# Patient Record
Sex: Male | Born: 1935 | Race: White | Hispanic: No | Marital: Single | Smoking: Never smoker
Health system: Southern US, Community
[De-identification: ages and names within clinical notes are randomized; demographics above are authoritative.]

## PROBLEM LIST (undated history)

## (undated) DIAGNOSIS — I509 Heart failure, unspecified: Secondary | ICD-10-CM

## (undated) DIAGNOSIS — M1990 Unspecified osteoarthritis, unspecified site: Secondary | ICD-10-CM

## (undated) DIAGNOSIS — F0390 Unspecified dementia without behavioral disturbance: Secondary | ICD-10-CM

## (undated) DIAGNOSIS — I10 Essential (primary) hypertension: Secondary | ICD-10-CM

## (undated) DIAGNOSIS — E119 Type 2 diabetes mellitus without complications: Secondary | ICD-10-CM

## (undated) HISTORY — PX: OTHER SURGICAL HISTORY: SHX169

---

## 2005-04-06 ENCOUNTER — Emergency Department: Payer: Self-pay | Admitting: Emergency Medicine

## 2006-02-02 ENCOUNTER — Other Ambulatory Visit: Payer: Self-pay

## 2006-02-02 ENCOUNTER — Emergency Department: Payer: Self-pay | Admitting: Unknown Physician Specialty

## 2006-05-13 ENCOUNTER — Ambulatory Visit: Payer: Self-pay | Admitting: Unknown Physician Specialty

## 2006-09-06 ENCOUNTER — Ambulatory Visit: Payer: Self-pay | Admitting: Internal Medicine

## 2008-02-16 ENCOUNTER — Inpatient Hospital Stay: Payer: Self-pay | Admitting: Internal Medicine

## 2009-01-31 ENCOUNTER — Ambulatory Visit: Payer: Self-pay | Admitting: Internal Medicine

## 2009-06-30 ENCOUNTER — Ambulatory Visit: Payer: Self-pay | Admitting: Internal Medicine

## 2010-06-30 ENCOUNTER — Emergency Department: Payer: Self-pay | Admitting: Emergency Medicine

## 2011-06-29 ENCOUNTER — Encounter: Payer: Self-pay | Admitting: Internal Medicine

## 2011-09-23 ENCOUNTER — Emergency Department: Payer: Self-pay | Admitting: Emergency Medicine

## 2011-09-23 LAB — URINALYSIS, COMPLETE
Bacteria: NONE SEEN
Bilirubin,UR: NEGATIVE
Blood: NEGATIVE
Glucose,UR: 50 mg/dL (ref 0–75)
Leukocyte Esterase: NEGATIVE
Ph: 5 (ref 4.5–8.0)
Specific Gravity: 1.017 (ref 1.003–1.030)
Squamous Epithelial: NONE SEEN
WBC UR: 1 /HPF (ref 0–5)

## 2011-09-23 LAB — CBC WITH DIFFERENTIAL/PLATELET
Basophil #: 0 10*3/uL (ref 0.0–0.1)
Eosinophil #: 0.1 10*3/uL (ref 0.0–0.7)
Eosinophil %: 1.7 %
HCT: 41.1 % (ref 40.0–52.0)
Lymphocyte #: 1.2 10*3/uL (ref 1.0–3.6)
MCH: 30.5 pg (ref 26.0–34.0)
MCHC: 34.7 g/dL (ref 32.0–36.0)
Monocyte %: 7.4 %
Neutrophil #: 4.6 10*3/uL (ref 1.4–6.5)
Platelet: 222 10*3/uL (ref 150–440)
RDW: 13.4 % (ref 11.5–14.5)

## 2011-09-23 LAB — DRUG SCREEN, URINE
Amphetamines, Ur Screen: NEGATIVE (ref ?–1000)
Barbiturates, Ur Screen: NEGATIVE (ref ?–200)
Benzodiazepine, Ur Scrn: POSITIVE (ref ?–200)
Cannabinoid 50 Ng, Ur ~~LOC~~: NEGATIVE (ref ?–50)
Methadone, Ur Screen: NEGATIVE (ref ?–300)
Opiate, Ur Screen: POSITIVE (ref ?–300)
Phencyclidine (PCP) Ur S: NEGATIVE (ref ?–25)

## 2011-09-23 LAB — TROPONIN I: Troponin-I: <0.02 ng/mL

## 2011-09-23 LAB — COMPREHENSIVE METABOLIC PANEL
Alkaline Phosphatase: 90 U/L (ref 50–136)
Bilirubin,Total: 0.4 mg/dL (ref 0.2–1.0)
Co2: 30 mmol/L (ref 21–32)
Creatinine: 0.96 mg/dL (ref 0.60–1.30)
EGFR (African American): >60
EGFR (Non-African Amer.): >60
Glucose: 265 mg/dL — ABNORMAL HIGH (ref 65–99)
SGPT (ALT): 52 U/L (ref 12–78)

## 2011-09-23 LAB — CK TOTAL AND CKMB (NOT AT ARMC)
CK, Total: 622 U/L — ABNORMAL HIGH (ref 35–232)
CK-MB: 19.8 ng/mL — ABNORMAL HIGH (ref 0.5–3.6)

## 2011-09-23 LAB — ETHANOL: Ethanol %: <0.003 % (ref 0.000–0.080)

## 2011-09-26 ENCOUNTER — Encounter: Payer: Self-pay | Admitting: Internal Medicine

## 2011-12-27 ENCOUNTER — Emergency Department: Payer: Self-pay | Admitting: Emergency Medicine

## 2011-12-27 LAB — URINALYSIS, COMPLETE
Bacteria: NONE SEEN
Glucose,UR: NEGATIVE mg/dL (ref 0–75)
Hyaline Cast: 1
Leukocyte Esterase: NEGATIVE
Nitrite: NEGATIVE
Specific Gravity: 1.027 (ref 1.003–1.030)
Squamous Epithelial: <1
WBC UR: 3 /HPF (ref 0–5)

## 2011-12-27 LAB — COMPREHENSIVE METABOLIC PANEL
Alkaline Phosphatase: 93 U/L (ref 50–136)
Bilirubin,Total: 0.8 mg/dL (ref 0.2–1.0)
Calcium, Total: 8.7 mg/dL (ref 8.5–10.1)
Chloride: 103 mmol/L (ref 98–107)
Co2: 26 mmol/L (ref 21–32)
Creatinine: 0.9 mg/dL (ref 0.60–1.30)
EGFR (Non-African Amer.): >60
Glucose: 185 mg/dL — ABNORMAL HIGH (ref 65–99)
Osmolality: 277 (ref 275–301)
SGPT (ALT): 20 U/L (ref 12–78)
Sodium: 135 mmol/L — ABNORMAL LOW (ref 136–145)
Total Protein: 7.3 g/dL (ref 6.4–8.2)

## 2011-12-27 LAB — CBC
HGB: 13 g/dL (ref 13.0–18.0)
MCH: 30.8 pg (ref 26.0–34.0)
MCHC: 34.5 g/dL (ref 32.0–36.0)
MCV: 89 fL (ref 80–100)
Platelet: 213 10*3/uL (ref 150–440)
RBC: 4.23 10*6/uL — ABNORMAL LOW (ref 4.40–5.90)
RDW: 13.1 % (ref 11.5–14.5)

## 2011-12-27 LAB — DRUG SCREEN, URINE
Barbiturates, Ur Screen: NEGATIVE (ref ?–200)
Cocaine Metabolite,Ur ~~LOC~~: NEGATIVE (ref ?–300)
Methadone, Ur Screen: NEGATIVE (ref ?–300)
Opiate, Ur Screen: POSITIVE (ref ?–300)
Phencyclidine (PCP) Ur S: NEGATIVE (ref ?–25)
Tricyclic, Ur Screen: NEGATIVE (ref ?–1000)

## 2011-12-27 LAB — TSH: Thyroid Stimulating Horm: 1.38 u[IU]/mL

## 2011-12-27 LAB — ETHANOL: Ethanol %: <0.003 % (ref 0.000–0.080)

## 2012-01-07 ENCOUNTER — Emergency Department: Payer: Self-pay | Admitting: Unknown Physician Specialty

## 2012-01-07 LAB — CBC
HGB: 14 g/dL (ref 13.0–18.0)
MCH: 30.3 pg (ref 26.0–34.0)
MCHC: 34.4 g/dL (ref 32.0–36.0)
MCV: 88 fL (ref 80–100)
Platelet: 217 10*3/uL (ref 150–440)
RBC: 4.63 10*6/uL (ref 4.40–5.90)

## 2012-01-07 LAB — COMPREHENSIVE METABOLIC PANEL
Albumin: 3.6 g/dL (ref 3.4–5.0)
Anion Gap: 6 — ABNORMAL LOW (ref 7–16)
BUN: 13 mg/dL (ref 7–18)
Bilirubin,Total: 0.6 mg/dL (ref 0.2–1.0)
Creatinine: 0.67 mg/dL (ref 0.60–1.30)
EGFR (African American): >60
EGFR (Non-African Amer.): >60
Glucose: 129 mg/dL — ABNORMAL HIGH (ref 65–99)
Potassium: 3.2 mmol/L — ABNORMAL LOW (ref 3.5–5.1)
Sodium: 142 mmol/L (ref 136–145)
Total Protein: 6.6 g/dL (ref 6.4–8.2)

## 2012-01-20 ENCOUNTER — Emergency Department: Payer: Self-pay | Admitting: Emergency Medicine

## 2012-02-01 ENCOUNTER — Encounter: Payer: Self-pay | Admitting: Internal Medicine

## 2012-02-07 LAB — URINALYSIS, COMPLETE
Ketone: NEGATIVE
Protein: NEGATIVE
RBC,UR: 1 /HPF (ref 0–5)
WBC UR: 2 /HPF (ref 0–5)

## 2012-02-13 ENCOUNTER — Encounter: Payer: Self-pay | Admitting: Internal Medicine

## 2012-02-26 LAB — HEPATIC FUNCTION PANEL A (ARMC)
Albumin: 3.7 g/dL (ref 3.4–5.0)
Alkaline Phosphatase: 75 U/L (ref 50–136)
Bilirubin, Direct: <0.1 mg/dL (ref 0.00–0.20)
SGOT(AST): 19 U/L (ref 15–37)
Total Protein: 7 g/dL (ref 6.4–8.2)

## 2012-02-29 LAB — URINALYSIS, COMPLETE
Bilirubin,UR: NEGATIVE
Ketone: NEGATIVE
Leukocyte Esterase: NEGATIVE
Nitrite: NEGATIVE
Ph: 8 (ref 4.5–8.0)
RBC,UR: <1 /HPF (ref 0–5)
Squamous Epithelial: NONE SEEN
WBC UR: <1 /HPF (ref 0–5)

## 2012-03-04 LAB — HEPATIC FUNCTION PANEL A (ARMC)
Albumin: 2.7 g/dL — ABNORMAL LOW (ref 3.4–5.0)
Bilirubin,Total: 0.3 mg/dL (ref 0.2–1.0)

## 2012-03-04 LAB — VALPROIC ACID LEVEL: Valproic Acid: 38 ug/mL — ABNORMAL LOW

## 2012-03-15 ENCOUNTER — Encounter: Payer: Self-pay | Admitting: Internal Medicine

## 2012-03-23 LAB — COMPREHENSIVE METABOLIC PANEL
Albumin: 3.9 g/dL (ref 3.4–5.0)
Alkaline Phosphatase: 67 U/L (ref 50–136)
Anion Gap: 7 (ref 7–16)
Bilirubin,Total: 0.9 mg/dL (ref 0.2–1.0)
Calcium, Total: 8.5 mg/dL (ref 8.5–10.1)
Co2: 28 mmol/L (ref 21–32)
Creatinine: 0.98 mg/dL (ref 0.60–1.30)
EGFR (African American): >60
Potassium: 4 mmol/L (ref 3.5–5.1)
Sodium: 143 mmol/L (ref 136–145)

## 2012-03-25 LAB — COMPREHENSIVE METABOLIC PANEL
Anion Gap: 8 (ref 7–16)
Calcium, Total: 8.7 mg/dL (ref 8.5–10.1)
Chloride: 110 mmol/L — ABNORMAL HIGH (ref 98–107)
Co2: 24 mmol/L (ref 21–32)
Creatinine: 0.9 mg/dL (ref 0.60–1.30)
EGFR (African American): >60
EGFR (Non-African Amer.): >60
Glucose: 149 mg/dL — ABNORMAL HIGH (ref 65–99)
Osmolality: 292 (ref 275–301)
Sodium: 142 mmol/L (ref 136–145)
Total Protein: 6.7 g/dL (ref 6.4–8.2)

## 2012-04-12 ENCOUNTER — Encounter: Payer: Self-pay | Admitting: Internal Medicine

## 2012-06-30 ENCOUNTER — Encounter: Payer: Self-pay | Admitting: Internal Medicine

## 2012-07-13 ENCOUNTER — Encounter: Payer: Self-pay | Admitting: Internal Medicine

## 2012-08-12 ENCOUNTER — Encounter: Payer: Self-pay | Admitting: Internal Medicine

## 2012-09-12 ENCOUNTER — Encounter: Payer: Self-pay | Admitting: Internal Medicine

## 2012-10-13 ENCOUNTER — Encounter: Payer: Self-pay | Admitting: Internal Medicine

## 2012-11-12 ENCOUNTER — Encounter: Payer: Self-pay | Admitting: Internal Medicine

## 2012-12-13 ENCOUNTER — Encounter: Payer: Self-pay | Admitting: Internal Medicine

## 2013-01-12 ENCOUNTER — Encounter: Payer: Self-pay | Admitting: Internal Medicine

## 2013-02-12 ENCOUNTER — Encounter: Payer: Self-pay | Admitting: Internal Medicine

## 2013-03-03 LAB — LIPID PANEL
Cholesterol: 178 mg/dL (ref 0–200)
HDL Cholesterol: 46 mg/dL (ref 40–60)
Ldl Cholesterol, Calc: 104 mg/dL — ABNORMAL HIGH (ref 0–100)
Triglycerides: 140 mg/dL (ref 0–200)
VLDL Cholesterol, Calc: 28 mg/dL (ref 5–40)

## 2013-03-03 LAB — BASIC METABOLIC PANEL
ANION GAP: 7 (ref 7–16)
BUN: 14 mg/dL (ref 7–18)
CALCIUM: 8.5 mg/dL (ref 8.5–10.1)
Chloride: 107 mmol/L (ref 98–107)
Co2: 29 mmol/L (ref 21–32)
Creatinine: 0.87 mg/dL (ref 0.60–1.30)
EGFR (African American): >60
Glucose: 130 mg/dL — ABNORMAL HIGH (ref 65–99)
Osmolality: 287 (ref 275–301)
POTASSIUM: 3.8 mmol/L (ref 3.5–5.1)
Sodium: 143 mmol/L (ref 136–145)

## 2013-03-03 LAB — HEPATIC FUNCTION PANEL A (ARMC)
ALK PHOS: 76 U/L
Albumin: 3.5 g/dL (ref 3.4–5.0)
BILIRUBIN TOTAL: 0.4 mg/dL (ref 0.2–1.0)
Bilirubin, Direct: <0.1 mg/dL (ref 0.00–0.20)
SGOT(AST): 16 U/L (ref 15–37)
SGPT (ALT): 16 U/L (ref 12–78)
TOTAL PROTEIN: 6.3 g/dL — AB (ref 6.4–8.2)

## 2013-03-03 LAB — HEMOGLOBIN A1C: Hemoglobin A1C: 7.9 % — ABNORMAL HIGH (ref 4.2–6.3)

## 2013-03-15 ENCOUNTER — Encounter: Payer: Self-pay | Admitting: Internal Medicine

## 2013-04-12 ENCOUNTER — Encounter: Payer: Self-pay | Admitting: Internal Medicine

## 2013-05-13 ENCOUNTER — Encounter: Payer: Self-pay | Admitting: Internal Medicine

## 2013-05-18 LAB — URINALYSIS, COMPLETE
BILIRUBIN, UR: NEGATIVE
Bacteria: NONE SEEN
Blood: NEGATIVE
Glucose,UR: NEGATIVE mg/dL (ref 0–75)
KETONE: NEGATIVE
LEUKOCYTE ESTERASE: NEGATIVE
Nitrite: NEGATIVE
PH: 7 (ref 4.5–8.0)
Protein: NEGATIVE
RBC,UR: NONE SEEN /HPF (ref 0–5)
Specific Gravity: 1.004 (ref 1.003–1.030)
Squamous Epithelial: NONE SEEN
WBC UR: NONE SEEN /HPF (ref 0–5)

## 2013-05-20 LAB — URINE CULTURE

## 2013-06-12 ENCOUNTER — Encounter: Payer: Self-pay | Admitting: Internal Medicine

## 2013-06-25 LAB — BASIC METABOLIC PANEL
ANION GAP: 5 — AB (ref 7–16)
BUN: 16 mg/dL (ref 7–18)
Calcium, Total: 8.8 mg/dL (ref 8.5–10.1)
Chloride: 106 mmol/L (ref 98–107)
Co2: 28 mmol/L (ref 21–32)
Creatinine: 0.92 mg/dL (ref 0.60–1.30)
EGFR (Non-African Amer.): >60
Glucose: 189 mg/dL — ABNORMAL HIGH (ref 65–99)
Osmolality: 284 (ref 275–301)
POTASSIUM: 3.9 mmol/L (ref 3.5–5.1)
SODIUM: 139 mmol/L (ref 136–145)

## 2013-06-25 LAB — HEPATIC FUNCTION PANEL A (ARMC)
Albumin: 3.6 g/dL (ref 3.4–5.0)
Alkaline Phosphatase: 75 U/L
BILIRUBIN TOTAL: 0.3 mg/dL (ref 0.2–1.0)
SGOT(AST): 11 U/L — ABNORMAL LOW (ref 15–37)
SGPT (ALT): 20 U/L (ref 12–78)
TOTAL PROTEIN: 7.1 g/dL (ref 6.4–8.2)

## 2013-06-25 LAB — LIPID PANEL
CHOLESTEROL: 176 mg/dL (ref 0–200)
HDL Cholesterol: 40 mg/dL (ref 40–60)
LDL CHOLESTEROL, CALC: 101 mg/dL — AB (ref 0–100)
TRIGLYCERIDES: 175 mg/dL (ref 0–200)
VLDL Cholesterol, Calc: 35 mg/dL (ref 5–40)

## 2013-06-25 LAB — HEMOGLOBIN A1C: Hemoglobin A1C: 8 % — ABNORMAL HIGH (ref 4.2–6.3)

## 2013-07-13 ENCOUNTER — Encounter: Payer: Self-pay | Admitting: Internal Medicine

## 2013-08-12 ENCOUNTER — Encounter: Payer: Self-pay | Admitting: Internal Medicine

## 2013-09-12 ENCOUNTER — Encounter: Payer: Self-pay | Admitting: Internal Medicine

## 2013-10-13 ENCOUNTER — Encounter: Payer: Self-pay | Admitting: Internal Medicine

## 2013-11-12 ENCOUNTER — Encounter: Payer: Self-pay | Admitting: Internal Medicine

## 2013-12-13 ENCOUNTER — Encounter: Payer: Self-pay | Admitting: Internal Medicine

## 2014-01-12 ENCOUNTER — Encounter: Payer: Self-pay | Admitting: Internal Medicine

## 2014-01-28 ENCOUNTER — Emergency Department: Payer: Self-pay | Admitting: Emergency Medicine

## 2014-02-12 ENCOUNTER — Encounter: Payer: Self-pay | Admitting: Internal Medicine

## 2014-06-01 NOTE — Consult Note (Signed)
Brief Consult Note: Diagnosis: dementia, alzheimers and/or vascular.   Patient was seen by consultant.   Consult note dictated.   Comments: Psychiatry: Patient seen. Patient is 81 year old man with mild dementia sent from [facility]. He is not suicidal and not agressive and not threatening and not in a state that would suggest extreme poor care. Not psychotic. No hx of suicidality or violence. Patient does not need hospital care and does not meet criteria for IVC. Suggest he be sent back to [facility] and they can continue care for him.  Electronic Signatures: Audery Amellapacs, Stavroula Rohde T (MD)  (Signed [phone]-Nov-13 14:26)  Authored: Brief Consult Note   Last Updated: 14-Nov-13 14:26 by Audery Amellapacs, Maicy Filip T (MD)
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
## 2015-05-03 ENCOUNTER — Encounter: Payer: Self-pay | Admitting: Urology

## 2015-05-03 ENCOUNTER — Ambulatory Visit: Payer: Medicare Other | Admitting: Urology

## 2016-10-23 ENCOUNTER — Encounter: Payer: Self-pay | Admitting: *Deleted

## 2016-10-23 ENCOUNTER — Inpatient Hospital Stay
Admission: EM | Admit: 2016-10-23 | Discharge: 2016-10-26 | DRG: 638 | Disposition: A | Discharge status: Home / Self Care | Payer: Medicare Other | Attending: Internal Medicine | Admitting: Internal Medicine

## 2016-10-23 DIAGNOSIS — E860 Dehydration: Secondary | ICD-10-CM | POA: Diagnosis present

## 2016-10-23 DIAGNOSIS — Z794 Long term (current) use of insulin: Secondary | ICD-10-CM | POA: Diagnosis not present

## 2016-10-23 DIAGNOSIS — R739 Hyperglycemia, unspecified: Secondary | ICD-10-CM

## 2016-10-23 DIAGNOSIS — M1990 Unspecified osteoarthritis, unspecified site: Secondary | ICD-10-CM | POA: Diagnosis present

## 2016-10-23 DIAGNOSIS — E876 Hypokalemia: Secondary | ICD-10-CM | POA: Diagnosis present

## 2016-10-23 DIAGNOSIS — Z79899 Other long term (current) drug therapy: Secondary | ICD-10-CM | POA: Diagnosis not present

## 2016-10-23 DIAGNOSIS — G301 Alzheimer's disease with late onset: Secondary | ICD-10-CM | POA: Diagnosis not present

## 2016-10-23 DIAGNOSIS — R4689 Other symptoms and signs involving appearance and behavior: Secondary | ICD-10-CM | POA: Diagnosis present

## 2016-10-23 DIAGNOSIS — T383X6A Underdosing of insulin and oral hypoglycemic [antidiabetic] drugs, initial encounter: Secondary | ICD-10-CM | POA: Diagnosis present

## 2016-10-23 DIAGNOSIS — Z046 Encounter for general psychiatric examination, requested by authority: Secondary | ICD-10-CM

## 2016-10-23 DIAGNOSIS — E1165 Type 2 diabetes mellitus with hyperglycemia: Principal | ICD-10-CM | POA: Diagnosis present

## 2016-10-23 DIAGNOSIS — Z23 Encounter for immunization: Secondary | ICD-10-CM

## 2016-10-23 DIAGNOSIS — E871 Hypo-osmolality and hyponatremia: Secondary | ICD-10-CM | POA: Diagnosis not present

## 2016-10-23 DIAGNOSIS — Z886 Allergy status to analgesic agent status: Secondary | ICD-10-CM

## 2016-10-23 DIAGNOSIS — I700 Atherosclerosis of aorta: Secondary | ICD-10-CM | POA: Diagnosis not present

## 2016-10-23 DIAGNOSIS — Z888 Allergy status to other drugs, medicaments and biological substances status: Secondary | ICD-10-CM

## 2016-10-23 DIAGNOSIS — I10 Essential (primary) hypertension: Secondary | ICD-10-CM | POA: Diagnosis present

## 2016-10-23 DIAGNOSIS — F0281 Dementia in other diseases classified elsewhere with behavioral disturbance: Secondary | ICD-10-CM | POA: Diagnosis not present

## 2016-10-23 DIAGNOSIS — F29 Unspecified psychosis not due to a substance or known physiological condition: Secondary | ICD-10-CM | POA: Diagnosis not present

## 2016-10-23 DIAGNOSIS — Z79891 Long term (current) use of opiate analgesic: Secondary | ICD-10-CM | POA: Diagnosis not present

## 2016-10-23 DIAGNOSIS — E11649 Type 2 diabetes mellitus with hypoglycemia without coma: Secondary | ICD-10-CM | POA: Diagnosis present

## 2016-10-23 DIAGNOSIS — E1110 Type 2 diabetes mellitus with ketoacidosis without coma: Secondary | ICD-10-CM | POA: Diagnosis not present

## 2016-10-23 DIAGNOSIS — G309 Alzheimer's disease, unspecified: Secondary | ICD-10-CM | POA: Diagnosis present

## 2016-10-23 DIAGNOSIS — A159 Respiratory tuberculosis unspecified: Secondary | ICD-10-CM

## 2016-10-23 HISTORY — DX: Type 2 diabetes mellitus without complications: E11.9

## 2016-10-23 HISTORY — DX: Unspecified osteoarthritis, unspecified site: M19.90

## 2016-10-23 LAB — URINE DRUG SCREEN, QUALITATIVE (ARMC ONLY)
Amphetamines, Ur Screen: NOT DETECTED
BARBITURATES, UR SCREEN: NOT DETECTED
BENZODIAZEPINE, UR SCRN: NOT DETECTED
CANNABINOID 50 NG, UR ~~LOC~~: NOT DETECTED
COCAINE METABOLITE, UR ~~LOC~~: NOT DETECTED
MDMA (Ecstasy)Ur Screen: NOT DETECTED
Methadone Scn, Ur: NOT DETECTED
Opiate, Ur Screen: POSITIVE — AB
Phencyclidine (PCP) Ur S: NOT DETECTED
TRICYCLIC, UR SCREEN: NOT DETECTED

## 2016-10-23 LAB — CBC
HCT: 40.9 % (ref 40.0–52.0)
HEMOGLOBIN: 14.2 g/dL (ref 13.0–18.0)
MCH: 30.7 pg (ref 26.0–34.0)
MCHC: 34.7 g/dL (ref 32.0–36.0)
MCV: 88.7 fL (ref 80.0–100.0)
Platelets: 229 10*3/uL (ref 150–440)
RBC: 4.61 MIL/uL (ref 4.40–5.90)
RDW: 13 % (ref 11.5–14.5)
WBC: 10.9 10*3/uL — ABNORMAL HIGH (ref 3.8–10.6)

## 2016-10-23 LAB — COMPREHENSIVE METABOLIC PANEL
ALT: 16 U/L — ABNORMAL LOW (ref 17–63)
AST: 19 U/L (ref 15–41)
Albumin: 4 g/dL (ref 3.5–5.0)
Alkaline Phosphatase: 108 U/L (ref 38–126)
Anion gap: 12 (ref 5–15)
BUN: 21 mg/dL — ABNORMAL HIGH (ref 6–20)
CHLORIDE: 100 mmol/L — AB (ref 101–111)
CO2: 19 mmol/L — ABNORMAL LOW (ref 22–32)
Calcium: 8.7 mg/dL — ABNORMAL LOW (ref 8.9–10.3)
Creatinine, Ser: 1.3 mg/dL — ABNORMAL HIGH (ref 0.61–1.24)
GFR, EST AFRICAN AMERICAN: 56 mL/min — AB (ref >60)
GFR, EST NON AFRICAN AMERICAN: 48 mL/min — AB (ref >60)
Glucose, Bld: 566 mg/dL (ref 65–99)
POTASSIUM: 4.2 mmol/L (ref 3.5–5.1)
SODIUM: 131 mmol/L — AB (ref 135–145)
Total Bilirubin: 0.7 mg/dL (ref 0.3–1.2)
Total Protein: 7.1 g/dL (ref 6.5–8.1)

## 2016-10-23 LAB — URINALYSIS, COMPLETE (UACMP) WITH MICROSCOPIC
Bacteria, UA: NONE SEEN
Bilirubin Urine: NEGATIVE
HGB URINE DIPSTICK: NEGATIVE
Ketones, ur: 5 mg/dL — AB
Leukocytes, UA: NEGATIVE
Nitrite: NEGATIVE
PH: 5 (ref 5.0–8.0)
Protein, ur: 30 mg/dL — AB
SQUAMOUS EPITHELIAL / LPF: NONE SEEN
Specific Gravity, Urine: 1.025 (ref 1.005–1.030)

## 2016-10-23 LAB — SALICYLATE LEVEL

## 2016-10-23 LAB — ACETAMINOPHEN LEVEL: Acetaminophen (Tylenol), Serum: <10 ug/mL — ABNORMAL LOW (ref 10–30)

## 2016-10-23 LAB — GLUCOSE, CAPILLARY
Glucose-Capillary: 454 mg/dL — ABNORMAL HIGH (ref 65–99)
Glucose-Capillary: 226 mg/dL — ABNORMAL HIGH (ref 65–99)

## 2016-10-23 LAB — ETHANOL

## 2016-10-23 MED ORDER — SODIUM CHLORIDE 0.9 % IV SOLN
INTRAVENOUS | Status: DC
  Administered 2016-10-23 – 2016-10-24 (×2): via INTRAVENOUS

## 2016-10-23 MED ORDER — TUBERCULIN PPD 5 UNIT/0.1ML ID SOLN
5.0000 [IU] | Freq: Once | INTRADERMAL | Status: DC
  Filled 2016-10-23: qty 0.1

## 2016-10-23 MED ORDER — SODIUM CHLORIDE 0.9 % IV SOLN
Freq: Once | INTRAVENOUS | Status: AC
  Administered 2016-10-23: 14:00:00 via INTRAVENOUS

## 2016-10-23 MED ORDER — DOCUSATE SODIUM 100 MG PO CAPS: 100.0000 mg | ORAL_CAPSULE | Freq: Two times a day (BID) | ORAL | Status: DC | PRN

## 2016-10-23 MED ORDER — INSULIN ASPART 100 UNIT/ML ~~LOC~~ SOLN
12.0000 [IU] | SUBCUTANEOUS | Status: AC
  Administered 2016-10-23: 12 [IU] via SUBCUTANEOUS
  Filled 2016-10-23: qty 1

## 2016-10-23 MED ORDER — LOSARTAN POTASSIUM 50 MG PO TABS
100.0000 mg | ORAL_TABLET | Freq: Every day | ORAL | Status: DC
  Administered 2016-10-23 – 2016-10-26 (×4): 100 mg via ORAL
  Filled 2016-10-23 (×4): qty 2

## 2016-10-23 MED ORDER — HEPARIN SODIUM (PORCINE) 5000 UNIT/ML IJ SOLN
5000.0000 [IU] | Freq: Three times a day (TID) | INTRAMUSCULAR | Status: DC
  Administered 2016-10-23 – 2016-10-25 (×6): 5000 [IU] via SUBCUTANEOUS
  Filled 2016-10-23 (×7): qty 1

## 2016-10-23 MED ORDER — HYDROCODONE-ACETAMINOPHEN 10-325 MG PO TABS
1.0000 | ORAL_TABLET | Freq: Four times a day (QID) | ORAL | Status: DC
  Administered 2016-10-23 – 2016-10-26 (×11): 1 via ORAL
  Filled 2016-10-23 (×11): qty 1

## 2016-10-23 MED ORDER — INSULIN ASPART 100 UNIT/ML ~~LOC~~ SOLN
0.0000 [IU] | Freq: Three times a day (TID) | SUBCUTANEOUS | Status: DC
  Administered 2016-10-23: 9 [IU] via SUBCUTANEOUS
  Administered 2016-10-24: 3 [IU] via SUBCUTANEOUS
  Administered 2016-10-24: 2 [IU] via SUBCUTANEOUS
  Administered 2016-10-24 – 2016-10-25 (×2): 5 [IU] via SUBCUTANEOUS
  Administered 2016-10-25 – 2016-10-26 (×2): 7 [IU] via SUBCUTANEOUS
  Administered 2016-10-26: 3 [IU] via SUBCUTANEOUS
  Filled 2016-10-23 (×8): qty 1

## 2016-10-23 MED ORDER — HYDROCODONE-ACETAMINOPHEN 10-325 MG PO TABS
1.0000 | ORAL_TABLET | Freq: Once | ORAL | Status: AC
  Administered 2016-10-23: 1 via ORAL
  Filled 2016-10-23: qty 1

## 2016-10-23 NOTE — Clinical Social Work Note (Signed)
Clinical Social Work Assessment  Patient Details  Name: Jesus Holland MRN: [id number] Date of Birth: 12/31/1928  Date of referral:  10/23/16               Reason for consult:  Discharge Planning, Facility Placement, Mental Health Concerns                Permission sought to share information with:  Guardian, Psychiatrist Permission granted to share information::  Yes, Verbal Permission Granted  Name::     Davonna Belling  Agency::  [facility]  Relationship::  Legal Guardian  Contact Information:  [phone]  Housing/Transportation Living arrangements for the past 2 months:  Hotel/Motel Source of Information:  Guardian, Patient Patient Interpreter Needed:  None Criminal Activity/Legal Involvement Pertinent to Current Situation/Hospitalization:  No - Comment as needed Significant Relationships:  Other Family Members, Pets Lives with:  Pets, Self Do you feel safe going back to the place where you live?  Yes Need for family participation in patient care:  Yes (Comment)  Care giving concerns:  No care giving concerns identified.    Social Worker assessment / plan:  CSW received a consult for "Patient lives in a motel, dementia, deemed incompetent." CSW met with pt at bedside. Pt very talkative. Pt has no family locally. Pt has been living in a hotel for the past 2 months with his 2 dogs. Adult Protective Services has an open investigation and has been assigned a DSS Markham Jordan [phone] by the court as of 10/23/16. He is under involuntary commitment. Pt is not accepting or believing that he has a Guardian.   Pt has been evaluated by psych and they will continue to assess pt daily and evaluate symptoms/progress in treatment, in addition to medication management. Pt was to [facility] into [facility] today and began to "act out" upon arrival. CSW continuing to follow for support and disposition needs.  Pt is being admitted  inpatient to the medical floor due to elevated blood sugar. 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   Patient/Family's Understanding of and Emotional Response to Diagnosis, Current Treatment, and Prognosis:  Pt expresses understanding of his current medical state. However, pt does not believe or accept that he now has a Guardian. Pt is calm, yet very talkative.   Emotional Assessment Appearance:  Appears stated age Attitude/Demeanor/Rapport:  Other (Appropriate) Affect (typically observed):  Anxious, Accepting, Other (Talkative) Orientation:  Oriented to Self Alcohol / Substance use:  Other Psych involvement (Current and /or in the community):  Yes (Comment)  Discharge Needs  Concerns to be addressed:  Discharge Planning Concerns, Mental Health Concerns, Care Coordination Readmission within the last 30 days:  No Current discharge risk:  Lives alone, Psychiatric Illness Barriers to Discharge:  Continued Medical Work up   Truitt Merle, LCSW 10/23/2016, 5:52 PM

## 2016-10-23 NOTE — ED Notes (Signed)
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
## 2016-10-23 NOTE — Consult Note (Signed)
[facility] Psychiatry Consult   Reason for Consult:  Consult for 83 year old man brought to the hospital under IVC filed by Adult Protective Services for behavior problems Referring Physician:  [place] Patient Identification: Jesus Holland MRN:  [id number] Principal Diagnosis: Dementia, Alzheimer's, with behavior disturbance Diagnosis:   Patient Active Problem List   Diagnosis Date Noted  . Hyperglycemia [R73.9] 10/23/2016  . Dementia, Alzheimer's, with behavior disturbance [G30.9, F02.81] 10/23/2016    Total Time spent with patient: 1 hour  Subjective:   Jesus Holland is a 83 y.o. male patient admitted with "I want to go home".  HPI:  Patient interviewed chart reviewed. 83 year old man was brought here under IVC by Adult Scientist, forensic. Reportedly they achieved guardianship of him today. They had arranged for him to be placed at the [facility]. When they went there he became agitated and combative and they had to bring him to the emergency room. On interview the patient is aware that he is at the hospital. He knows the correct year. He tells me that they brought him over here because he did not want to stay where they had put him. Patient denied any suicidal or homicidal ideation. He denied having any hallucinations. His insight into the full situation however is limited. He does not either understand or accept that guardianship was obtained today. Patient insists that he has been able to take care of himself without any difficulty. He says moreover that his chief complaint about going to the [facility] is that they will not let him keep his 2 small dogs. Patient was found to have a blood sugar almost 600. Really looks like he's not taking care of his diabetes well. He is being admitted to medicine because of that.  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 His wife is deceased. He has no children or other family in the area. Apparently Adult Protective Services has been working  with him for a while and has had him declared incompetent and been awarded guardianship today.  Medical history: Diabetes currently with severe hyperglycemia  Substance abuse history: Denies a history of alcohol or drug abuse  Past Psychiatric History: No past history of any psychiatric treatment or admissions. No history of suicide attempts. No previous psychiatric medicine identified. Patient has been known to have dementia and been declining in his ability to care for himself for a while.  Risk to Self: Is patient at risk for suicide?: No Risk to Others:   Prior Inpatient Therapy:   Prior Outpatient Therapy:    Past Medical History:  Past Medical History:  Diagnosis Date  . Arthritis   . Diabetes mellitus without complication ([place])    History reviewed. No pertinent surgical history. Family History:  Family History  Problem Relation Age of Onset  . Family history unknown: Yes   Family Psychiatric  History: Denies knowing of any family history Social History:  History  Alcohol Use No     History  Drug Use No    Social History   Social History  . Marital status: Single    Spouse name: N/A  . Number of children: N/A  . Years of education: N/A   Social History Main Topics  . Smoking status: Never Smoker  . Smokeless tobacco: Never Used  . Alcohol use No  . Drug use: No  . Sexual activity: Not Asked   Other Topics Concern  . None   Social History Narrative  . None   Additional Social History:  Allergies:   Allergies  Allergen Reactions  . Aspirin Nausea And Vomiting  . Escitalopram Other (See Comments)  . Metformin Other (See Comments)  . Quinine Nausea And Vomiting    Labs:  Results for orders placed or performed during the hospital encounter of 10/23/16 (from the past 48 hour(s))  Comprehensive metabolic panel     Status: Abnormal   Collection Time: 10/23/16  1:18 PM  Result Value Ref Range   Sodium 131 (L) 135 - 145 mmol/L   Potassium 4.2 3.5 -  5.1 mmol/L   Chloride 100 (L) 101 - 111 mmol/L   CO2 19 (L) 22 - 32 mmol/L   Glucose, Bld 566 (HH) 65 - 99 mg/dL    Comment: CRITICAL RESULT CALLED TO, READ BACK BY AND VERIFIED WITH AMY TIGGS ON 10/23/16 AT 1412 BY JAG    BUN 21 (H) 6 - 20 mg/dL   Creatinine, Ser 1.30 (H) 0.61 - 1.24 mg/dL   Calcium 8.7 (L) 8.9 - 10.3 mg/dL   Total Protein 7.1 6.5 - 8.1 g/dL   Albumin 4.0 3.5 - 5.0 g/dL   AST 19 15 - 41 U/L   ALT 16 (L) 17 - 63 U/L   Alkaline Phosphatase 108 38 - 126 U/L   Total Bilirubin 0.7 0.3 - 1.2 mg/dL   GFR calc non Af Amer 48 (L) >60 mL/min   GFR calc Af Amer 56 (L) >60 mL/min    Comment: (NOTE) The eGFR has been calculated using the CKD EPI equation. This calculation has not been validated in all clinical situations. eGFR's persistently <60 mL/min signify possible Chronic Kidney Disease.    Anion gap 12 5 - 15  Ethanol     Status: None   Collection Time: 10/23/16  1:18 PM  Result Value Ref Range   Alcohol, Ethyl (B) <5 <5 mg/dL    Comment:        LOWEST DETECTABLE LIMIT FOR SERUM ALCOHOL IS 5 mg/dL FOR MEDICAL PURPOSES ONLY   Salicylate level     Status: None   Collection Time: 10/23/16  1:18 PM  Result Value Ref Range   Salicylate Lvl <3.5 2.8 - 30.0 mg/dL  Acetaminophen level     Status: Abnormal   Collection Time: 10/23/16  1:18 PM  Result Value Ref Range   Acetaminophen (Tylenol), Serum <10 (L) 10 - 30 ug/mL    Comment:        THERAPEUTIC CONCENTRATIONS VARY SIGNIFICANTLY. A RANGE OF 10-30 ug/mL MAY BE AN EFFECTIVE CONCENTRATION FOR MANY PATIENTS. HOWEVER, SOME ARE BEST TREATED AT CONCENTRATIONS OUTSIDE THIS RANGE. ACETAMINOPHEN CONCENTRATIONS >150 ug/mL AT 4 HOURS AFTER INGESTION AND >50 ug/mL AT 12 HOURS AFTER INGESTION ARE OFTEN ASSOCIATED WITH TOXIC REACTIONS.   cbc     Status: Abnormal   Collection Time: 10/23/16  1:18 PM  Result Value Ref Range   WBC 10.9 (H) 3.8 - 10.6 K/uL   RBC 4.61 4.40 - 5.90 MIL/uL   Hemoglobin 14.2 13.0 - 18.0  g/dL   HCT 40.9 40.0 - 52.0 %   MCV 88.7 80.0 - 100.0 fL   MCH 30.7 26.0 - 34.0 pg   MCHC 34.7 32.0 - 36.0 g/dL   RDW 13.0 11.5 - 14.5 %   Platelets 229 150 - 440 K/uL    Current Facility-Administered Medications  Medication Dose Route Frequency Provider Last Rate Last Dose  . 0.9 %  sodium chloride infusion   Intravenous Continuous Vaughan Basta, MD      .  insulin aspart (novoLOG) injection 0-9 Units  0-9 Units Subcutaneous TID WC Vaughan Basta, MD      . insulin aspart (novoLOG) injection 12 Units  12 Units Subcutaneous STAT Vaughan Basta, MD       Current Outpatient Prescriptions  Medication Sig Dispense Refill  . HYDROcodone-acetaminophen (NORCO) 10-325 MG tablet Take 1 tablet by mouth 4 (four) times daily.    Marland Kitchen LANTUS SOLOSTAR 100 UNIT/ML Solostar Pen Inject 44 Units into the skin daily.    Marland Kitchen losartan (COZAAR) 100 MG tablet Take 100 mg by mouth daily.      Musculoskeletal: Strength & Muscle Tone: decreased Gait & Station: unsteady Patient leans: N/A  Psychiatric Specialty Exam: Physical Exam  Nursing note and vitals reviewed. Constitutional: He appears well-developed and well-nourished.  HENT:  Head: Normocephalic and atraumatic.  Eyes: Pupils are equal, round, and reactive to light. Conjunctivae are normal.  Neck: Normal range of motion.  Cardiovascular: Normal heart sounds.   Respiratory: Effort normal.  GI: Soft.  Musculoskeletal: Normal range of motion.  Neurological: He is alert.  Skin: Skin is warm and dry.  Psychiatric: He has a normal mood and affect. His speech is delayed, tangential and slurred. He is agitated. He is not aggressive. Thought content is paranoid. Cognition and memory are impaired. He expresses impulsivity and inappropriate judgment. He expresses no homicidal and no suicidal ideation. He exhibits abnormal recent memory.    Review of Systems  Constitutional: Positive for malaise/fatigue.  HENT: Negative.   Eyes:  Negative.   Respiratory: Negative.   Cardiovascular: Negative.   Gastrointestinal: Negative.   Musculoskeletal: Negative.   Skin: Negative.   Neurological: Negative.   Psychiatric/Behavioral: Negative.     Blood pressure 137/68, pulse (!) 18, temperature 98.7 F (37.1 C), temperature source Oral, resp. rate 18, height 5' 10"  (1.778 m), weight 69.9 kg (154 lb), SpO2 98 %.Body mass index is 22.1 kg/m.  General Appearance: Disheveled  Eye Contact:  Fair  Speech:  Slow  Volume:  Decreased  Mood:  Euthymic  Affect:  Constricted  Thought Process:  Disorganized  Orientation:  Full (Time, Place, and Person)  Thought Content:  Illogical, Rumination and Tangential  Suicidal Thoughts:  No  Homicidal Thoughts:  No  Memory:  Immediate;   Good Recent;   Fair Remote;   Fair  Judgement:  Impaired  Insight:  Shallow  Psychomotor Activity:  Decreased  Concentration:  Concentration: Poor  Recall:  Poor  Fund of Knowledge:  Fair  Language:  Fair  Akathisia:  No  Handed:  Right  AIMS (if indicated):     Assets:  Resilience Social Support  ADL's:  Impaired  Cognition:  Impaired,  Mild and Moderate  Sleep:        Treatment Plan Summary: Daily contact with patient to assess and evaluate symptoms and progress in treatment, Medication management and Plan 83 year old man with dementia. Became agitated when they tried to place him today. He is upset that he cannot keep his dogs. All of this makes sense but the patient will not accept the reality that guardianship has been achieved and he no longer has the right to make any decisions about his living situation. Continues to insist that he will not go back to the [facility]. In fact he went so far as to suggest that he would be traveling to [place] which is obviously impossible. The patient is being admitted to the medical service because of hyperglycemia. I am going to add a small amount of  antipsychotic for improvement I hope in his behavior. We will  follow him up in the hospital.  Disposition: Supportive therapy provided about ongoing stressors.  Alethia Berthold, MD 10/23/2016 5:33 PM

## 2016-10-23 NOTE — ED Notes (Signed)
BEHAVIORAL HEALTH ROUNDING Patient sleeping: No. Patient alert and oriented: yes Behavior appropriate: Yes.  ; If no, describe:  Nutrition and fluids offered: yes Toileting and hygiene offered: Yes  Sitter present: q15 minute observations and security  monitoring Law enforcement present: Yes  ODS  

## 2016-10-23 NOTE — H&P (Signed)
[employer] - [place] at [facility]   PATIENT NAME: Jesus Holland    MR#:  [id number]  DATE OF BIRTH:  June 03, 1928  DATE OF ADMISSION:  10/23/2016  PRIMARY CARE PHYSICIAN: Patient, No Pcp Per   REQUESTING/REFERRING PHYSICIAN: Chrissie Noa  CHIEF COMPLAINT:   Chief Complaint  Patient presents with  . IVC    HISTORY OF PRESENT ILLNESS: Kazim Corrales  is a 83 y.o. male with a known history of Diabetes, arthritis- have dementia and he lives in a motel. As per him he was ready to go to assisted living place but they were refusing to take his 2 dogs with him so he chose to leave in a motel. He is blaming all this to Department of Social Services as he tried multiple times and could not get appointments. He was supposed to take insulin for his diabetes but not very sure if he is taking one time or not. Currently he is alert and oriented but appears to be having some confusion over some dementia about his surroundings and not able to say clearly why he did not take insulin. Department of Social Services is called police on him to bring him to emergency room. In ER he was kept on involuntary commitment because of his possible dementia and his blood sugar was noted more than 500. No signs of ketoacidosis so started on IV fluids and given to hospitalist team.  PAST MEDICAL HISTORY:   Past Medical History:  Diagnosis Date  . Arthritis   . Diabetes mellitus without complication (HCC)     PAST SURGICAL HISTORY: History reviewed. No pertinent surgical history.  SOCIAL HISTORY:  Social History  Substance Use Topics  . Smoking status: Never Smoker  . Smokeless tobacco: Never Used  . Alcohol use No    FAMILY HISTORY:  Family History  Problem Relation Age of Onset  . Family history unknown: Yes    DRUG ALLERGIES: Not on File  REVIEW OF SYSTEMS:   CONSTITUTIONAL: No fever, fatigue or weakness.  EYES: No blurred or double vision.  EARS, NOSE, AND THROAT: No tinnitus or ear  pain.  RESPIRATORY: No cough, shortness of breath, wheezing or hemoptysis.  CARDIOVASCULAR: No chest pain, orthopnea, edema.  GASTROINTESTINAL: No nausea, vomiting, diarrhea or abdominal pain.  GENITOURINARY: No dysuria, hematuria.  ENDOCRINE: No polyuria, nocturia,  HEMATOLOGY: No anemia, easy bruising or bleeding SKIN: No rash or lesion. MUSCULOSKELETAL: No joint pain or arthritis.   NEUROLOGIC: No tingling, numbness, weakness.  PSYCHIATRY: No anxiety or depression.   MEDICATIONS AT HOME:  Prior to Admission medications   Medication Sig Start Date End Date Taking? Authorizing Provider  HYDROcodone-acetaminophen (NORCO) 10-325 MG tablet Take 1 tablet by mouth 4 (four) times daily. 10/20/16  Yes [provider]  LANTUS SOLOSTAR 100 UNIT/ML Solostar Pen Inject 44 Units into the skin daily. 09/24/16  Yes [provider]  losartan (COZAAR) 100 MG tablet Take 100 mg by mouth daily. 08/16/16  Yes [provider]      PHYSICAL EXAMINATION:   VITAL SIGNS: Blood pressure 137/68, pulse (!) 18, temperature 98.7 F (37.1 C), temperature source Oral, resp. rate 18, height  (1.778 m), weight 69.9 kg (154 lb), SpO2 98 %.  GENERAL:  83 y.o.-year-old patient lying in the bed with no acute distress.  EYES: Pupils equal, round, reactive to light and accommodation. No scleral icterus. Extraocular muscles intact.  HEENT: Head atraumatic, normocephalic. Oropharynx and nasopharynx clear.  NECK:  Supple, no jugular venous distention.  No thyroid enlargement, no tenderness.  LUNGS: Normal breath sounds bilaterally, no wheezing, rales,rhonchi or crepitation. No use of accessory muscles of respiration.  CARDIOVASCULAR: S1, S2 normal. No murmurs, rubs, or gallops.  ABDOMEN: Soft, nontender, nondistended. Bowel sounds present. No organomegaly or mass.  EXTREMITIES: No pedal edema, cyanosis, or clubbing.  NEUROLOGIC: Cranial nerves II through XII are intact. Muscle strength 5/5 in all  extremities. Sensation intact. Gait not checked.  PSYCHIATRIC: The patient is alert and oriented x 2 but have some confusion and not very clear about the events.  SKIN: No obvious rash, lesion, or ulcer.   LABORATORY PANEL:   CBC  Recent Labs Lab 10/23/16 1318  WBC 10.9*  HGB 14.2  HCT 40.9  PLT 229  MCV 88.7  MCH 30.7  MCHC 34.7  RDW 13.0   ------------------------------------------------------------------------------------------------------------------  Chemistries   Recent Labs Lab 10/23/16 1318  NA 131*  K 4.2  CL 100*  CO2 19*  GLUCOSE 566*  BUN 21*  CREATININE 1.30*  CALCIUM 8.7*  AST 19  ALT 16*  ALKPHOS 108  BILITOT 0.7   ------------------------------------------------------------------------------------------------------------------ estimated creatinine clearance is 40.3 mL/min (A) (by C-G formula based on SCr of 1.3 mg/dL (H)). ------------------------------------------------------------------------------------------------------------------ No results for input(s): TSH, T4TOTAL, T3FREE, THYROIDAB in the last 72 hours.  Invalid input(s): FREET3   Coagulation profile No results for input(s): INR, PROTIME in the last 168 hours. ------------------------------------------------------------------------------------------------------------------- No results for input(s): DDIMER in the last 72 hours. -------------------------------------------------------------------------------------------------------------------  Cardiac Enzymes No results for input(s): CKMB, TROPONINI, MYOGLOBIN in the last 168 hours.  Invalid input(s): CK ------------------------------------------------------------------------------------------------------------------ Invalid input(s): POCBNP  ---------------------------------------------------------------------------------------------------------------  Urinalysis    Component Value Date/Time   COLORURINE Colorless 05/18/2013  1739   APPEARANCEUR Clear 05/18/2013 1739   LABSPEC 1.004 05/18/2013 1739   PHURINE 7.0 05/18/2013 1739   GLUCOSEU Negative 05/18/2013 1739   HGBUR Negative 05/18/2013 1739   BILIRUBINUR Negative 05/18/2013 1739   KETONESUR Negative 05/18/2013 1739   PROTEINUR Negative 05/18/2013 1739   NITRITE Negative 05/18/2013 1739   LEUKOCYTESUR Negative 05/18/2013 1739     RADIOLOGY: No results found.  EKG: Orders placed or performed in visit on 01/28/14  . EKG 12-Lead    IMPRESSION AND PLAN:  * Hyperglycemia without ketoacidosis   His compliance is questionable due to possible dementia.   He is on involuntary commitment currently and DSS is working with psychiatric the to get him in geriatric psych unit.   Will admit to medical floor and give insulin for now, with IV fluids.    * Confusion, likely advanced dementia   Check urinalysis.   Psych to work on his placement along with DSS   * Hypertension   Continue losartan.   All the records are reviewed and case discussed with ED provider. Management plans discussed with the patient, family and they are in agreement.  CODE STATUS: Full code Code Status History    This patient does not have a recorded code status. Please follow your organizational policy for patients in this situation.       TOTAL TIME TAKING CARE OF THIS PATIENT: 50 minutes.    Altamese DillingVACHHANI, Hasheem Voland M.D on 10/23/2016   Between 7am to 6pm - Pager - [phone]  After 6pm go to [email or web address] - password [email or web address]  [facility] [place] [facility]  Office  [phone]  CC: Primary care physician; Patient, No Pcp Per   Note: This dictation was prepared with Dragon dictation along with smaller phrase technology. Any transcriptional errors that result from  this process are unintentional.

## 2016-10-23 NOTE — Progress Notes (Signed)
Pt with critical fsbs without intervention from ER. Gave scheduled insulin per Md order although it is 2 hours overdue. Will re check after administration. Also, ED did not give pt scheduled hydrocodone that pt takes  every 6 hours. Per Md will give dose now and resume schedule at 10 pm.
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
## 2016-10-23 NOTE — ED Notes (Signed)
PT  IVC  PENDING  SOCIAL WORK CONSULT

## 2016-10-24 LAB — BASIC METABOLIC PANEL
Anion gap: 7 (ref 5–15)
BUN: 20 mg/dL (ref 6–20)
CHLORIDE: 110 mmol/L (ref 101–111)
CO2: 23 mmol/L (ref 22–32)
Calcium: 8 mg/dL — ABNORMAL LOW (ref 8.9–10.3)
Creatinine, Ser: 0.93 mg/dL (ref 0.61–1.24)
GFR calc Af Amer: >60 mL/min (ref >60)
GFR calc non Af Amer: >60 mL/min (ref >60)
GLUCOSE: 86 mg/dL (ref 65–99)
Potassium: 3.2 mmol/L — ABNORMAL LOW (ref 3.5–5.1)
SODIUM: 140 mmol/L (ref 135–145)

## 2016-10-24 LAB — CBC
HEMATOCRIT: 34.1 % — AB (ref 40.0–52.0)
Hemoglobin: 12 g/dL — ABNORMAL LOW (ref 13.0–18.0)
MCH: 30.6 pg (ref 26.0–34.0)
MCHC: 35.3 g/dL (ref 32.0–36.0)
MCV: 86.7 fL (ref 80.0–100.0)
Platelets: 197 10*3/uL (ref 150–440)
RBC: 3.93 MIL/uL — ABNORMAL LOW (ref 4.40–5.90)
RDW: 13.3 % (ref 11.5–14.5)
WBC: 9.3 10*3/uL (ref 3.8–10.6)

## 2016-10-24 LAB — GLUCOSE, CAPILLARY
GLUCOSE-CAPILLARY: 189 mg/dL — AB (ref 65–99)
Glucose-Capillary: 283 mg/dL — ABNORMAL HIGH (ref 65–99)
Glucose-Capillary: 223 mg/dL — ABNORMAL HIGH (ref 65–99)
Glucose-Capillary: 299 mg/dL — ABNORMAL HIGH (ref 65–99)

## 2016-10-24 LAB — MAGNESIUM: Magnesium: 1.6 mg/dL — ABNORMAL LOW (ref 1.7–2.4)

## 2016-10-24 MED ORDER — INSULIN DETEMIR 100 UNIT/ML ~~LOC~~ SOLN
25.0000 [IU] | Freq: Every day | SUBCUTANEOUS | Status: DC
  Administered 2016-10-24: 25 [IU] via SUBCUTANEOUS
  Filled 2016-10-24 (×2): qty 0.25

## 2016-10-24 MED ORDER — INFLUENZA VAC SPLIT HIGH-DOSE 0.5 ML IM SUSY
0.5000 mL | PREFILLED_SYRINGE | INTRAMUSCULAR | Status: AC
  Administered 2016-10-25: 0.5 mL via INTRAMUSCULAR
  Filled 2016-10-24 (×2): qty 0.5

## 2016-10-24 MED ORDER — POTASSIUM CHLORIDE CRYS ER 20 MEQ PO TBCR
40.0000 meq | EXTENDED_RELEASE_TABLET | Freq: Once | ORAL | Status: AC
  Administered 2016-10-24: 40 meq via ORAL
  Filled 2016-10-24: qty 2

## 2016-10-24 MED ORDER — LORAZEPAM 2 MG/ML IJ SOLN: 1.0000 mg | Freq: Four times a day (QID) | INTRAMUSCULAR | Status: DC | PRN

## 2016-10-24 MED ORDER — MAGNESIUM OXIDE 400 (241.3 MG) MG PO TABS
800.0000 mg | ORAL_TABLET | Freq: Once | ORAL | Status: AC
  Administered 2016-10-24: 800 mg via ORAL
  Filled 2016-10-24 (×2): qty 2

## 2016-10-24 MED ORDER — INSULIN DETEMIR 100 UNIT/ML ~~LOC~~ SOLN: 25.0000 [IU] | Freq: Every day | SUBCUTANEOUS | Status: DC

## 2016-10-24 NOTE — Consult Note (Signed)
Psychiatry: Patient with dementia who now has a legal guardian in APS. Refusing placement and acting opp[sitional because his dementia prevents him from really understanding the meaning of guardianship. Advise SW contact guardian, who should TRY to work with him to come up with an agreeable solution. If this can't be done, then try referral to geriatric psychiatry hospital for treatment that may ultimately improve his cooperation. Patient is not a candidate for inpatient psychiatry treatment at [facility] due to age and diagnosis.Spoke with hospitalist today. Will follow patient in hospital.

## 2016-10-24 NOTE — Progress Notes (Signed)
Inpatient Diabetes Program Recommendations  AACE/ADA: New Consensus Statement on Inpatient Glycemic Control (2015)  Target Ranges:  Prepandial:   less than 140 mg/dL      Peak postprandial:   less than 180 mg/dL (1-2 hours)      Critically ill patients:  140 - 180 mg/dL   Lab Results  Component Value Date   GLUCAP 283 (H) 10/24/2016   HGBA1C 8.0 (H) 06/25/2013    Review of Glycemic Control:  Results for Jesus Holland, Jesus Holland (MRN [id number]) as of 10/24/2016 11:26  Ref. Range 10/23/2016 17:58 10/23/2016 20:55 10/24/2016 08:46  Glucose-Capillary Latest Ref Range: 65 - 99 mg/dL 644454 (H) 034226 (H) 742283 (H)   Diabetes history: Type 2 diabetes Outpatient Diabetes medications: Lantus 44 units daily Current orders for Inpatient glycemic control:  Novolog sensitive tid with meals  Inpatient Diabetes Program Recommendations:   Please restart a portion of patient's home dose of Lantus.  Consider Lantus 22 units daily.    Thanks, Beryl MeagerJenny Bellamia Ferch, RN, BC-ADM Inpatient Diabetes Coordinator Pager [phone] (8a-5p)

## 2016-10-24 NOTE — Clinical Social Work Note (Signed)
CSW has sent out patient's referral to [facility] facilities this morning: [place], [place], and [facility]. CSW contacted patient's DSS APS Guardian: Cleophas Dunkerngela Riley:: [phone] to determine if [facility] was going to even consider finishing admission paperwork due to patient's behaviors yesterday at their facility. York SpanielMonica Alayiah Fontes MSW,LCSW [phone]

## 2016-10-24 NOTE — Progress Notes (Signed)
[employer] - [place] at [facility]   PATIENT NAME: Jesus PagesVictor Holland    MR#:  [id number]  DATE OF BIRTH:  1929-01-23  SUBJECTIVE:  CHIEF COMPLAINT:   Chief Complaint  Patient presents with  . IVC   Oriented to place but not time  REVIEW OF SYSTEMS:    Review of Systems  Unable to perform ROS: Dementia    DRUG ALLERGIES:   Allergies  Allergen Reactions  . Aspirin Nausea And Vomiting  . Escitalopram Other (See Comments)  . Metformin Other (See Comments)  . Quinine Nausea And Vomiting    VITALS:  Blood pressure 131/65, pulse 74, temperature 98.5 F (36.9 C), temperature source Oral, resp. rate 20, height 5\' 10"  (1.778 m), weight 69.9 kg (154 lb), SpO2 97 %.  PHYSICAL EXAMINATION:   Physical Exam  GENERAL:  83 y.o.-year-old patient lying in the bed with no acute distress.  EYES: Pupils equal, round, reactive to light and accommodation. No scleral icterus. Extraocular muscles intact.  HEENT: Head atraumatic, normocephalic. Oropharynx and nasopharynx clear.  NECK:  Supple, no jugular venous distention. No thyroid enlargement, no tenderness.  LUNGS: Normal breath sounds bilaterally, no wheezing, rales, rhonchi. No use of accessory muscles of respiration.  CARDIOVASCULAR: S1, S2 normal. No murmurs, rubs, or gallops.  ABDOMEN: Soft, nontender, nondistended. Bowel sounds present. No organomegaly or mass.  EXTREMITIES: No cyanosis, clubbing or edema b/l.    NEUROLOGIC: Cranial nerves II through XII are intact. No focal Motor or sensory deficits b/l.   PSYCHIATRIC: The patient is alert and oriented x 1. Pleasant  SKIN: No obvious rash, lesion, or ulcer.   LABORATORY PANEL:   CBC  Recent Labs Lab 10/24/16 0457  WBC 9.3  HGB 12.0*  HCT 34.1*  PLT 197   ------------------------------------------------------------------------------------------------------------------ Chemistries   Recent Labs Lab 10/23/16 1318 10/24/16 0457  NA 131* 140  K 4.2 3.2*   CL 100* 110  CO2 19* 23  GLUCOSE 566* 86  BUN 21* 20  CREATININE 1.30* 0.93  CALCIUM 8.7* 8.0*  MG  --  1.6*  AST 19  --   ALT 16*  --   ALKPHOS 108  --   BILITOT 0.7  --    ------------------------------------------------------------------------------------------------------------------  Cardiac Enzymes No results for input(s): TROPONINI in the last 168 hours. ------------------------------------------------------------------------------------------------------------------  RADIOLOGY:  No results found.   ASSESSMENT AND PLAN:   * Uncontrolled DM with hyperglycemia  SSi Restart Levemir at 25 units QHS Improving Still elevated into 200s  * Dementia with behavioral disturbance Has legal gaurdian and now in IVC with sitter due to patient refusing to go to facility and brought to ED Difficult situation for placement  * Hypokalemia Replace orally  *  All the records are reviewed and case discussed with Care Management/Social Worker Management plans discussed with the patient, family and they are in agreement.  CODE STATUS: FULL CODE  DVT Prophylaxis: SCDs  TOTAL TIME TAKING CARE OF THIS PATIENT: 40 minutes.  >50% time spent in co-ordinating care discussing with patient  POSSIBLE D/C IN 1-2 DAYS, DEPENDING ON CLINICAL CONDITION.  Milagros LollSudini, Sahian Kerney R M.D on 10/24/2016 at 12:59 PM  Between 7am to 6pm - Pager - [phone]  After 6pm go to [email or web address] - password [email or web address]  [facility] [place] [facility]  Office  [phone]  CC: Primary care physician; Patient, No Pcp Per  Note: This dictation was prepared with Dragon dictation along with smaller phrase technology. Any transcriptional errors that result from this process are unintentional.

## 2016-10-24 NOTE — Progress Notes (Signed)
Pt is refusing to have a new IV placed until a doctor speaks with him.

## 2016-10-24 NOTE — Progress Notes (Signed)
Discussed the need to restart PIV due to one being pulled out. Pt refused stating "I dont want that until I speak to a Dr. [facility] am [facility] [place] Prime doc paged and notified per Leslye Peerlivia Rogers RN. Pt is tolerating PO fluids.

## 2016-10-25 LAB — GLUCOSE, CAPILLARY
GLUCOSE-CAPILLARY: 57 mg/dL — AB (ref 65–99)
GLUCOSE-CAPILLARY: 68 mg/dL (ref 65–99)
GLUCOSE-CAPILLARY: 99 mg/dL (ref 65–99)
GLUCOSE-CAPILLARY: 341 mg/dL — AB (ref 65–99)
GLUCOSE-CAPILLARY: 266 mg/dL — AB (ref 65–99)
GLUCOSE-CAPILLARY: 273 mg/dL — AB (ref 65–99)
Glucose-Capillary: 161 mg/dL — ABNORMAL HIGH (ref 65–99)

## 2016-10-25 MED ORDER — INSULIN DETEMIR 100 UNIT/ML ~~LOC~~ SOLN
18.0000 [IU] | Freq: Every day | SUBCUTANEOUS | Status: DC
  Administered 2016-10-25: 18 [IU] via SUBCUTANEOUS
  Filled 2016-10-25 (×2): qty 0.18

## 2016-10-25 NOTE — Clinical Social Work Note (Signed)
DSS Guardian, Jesus Holland, returned CSW call and CSW informed her that at this point, she will need to find facility placement or she will need to make arrangements for patient to return home with more care. Jesus Holland is to call me back after she speaks with her supervisor: Jesus Holland. York SpanielMonica Aybree Holland MSW,LCSW [phone]

## 2016-10-25 NOTE — Progress Notes (Signed)
[employer] - [place] at [facility]   PATIENT NAME: Jesus Holland    MR#:  [id number]  DATE OF BIRTH:  1928/10/23  SUBJECTIVE:  CHIEF COMPLAINT:   Chief Complaint  Patient presents with  . IVC   Patient is laying in bed. Pleasant. Wants to go home. IVC in place. Sitter at bedside.  Blood sugars low into the 50s earlier today.  REVIEW OF SYSTEMS:    Review of Systems  Unable to perform ROS: Dementia    DRUG ALLERGIES:   Allergies  Allergen Reactions  . Aspirin Nausea And Vomiting  . Escitalopram Other (See Comments)  . Metformin Other (See Comments)  . Quinine Nausea And Vomiting    VITALS:  Blood pressure (!) 141/67, pulse 73, temperature 97.7 F (36.5 C), temperature source Oral, resp. rate 16, height  (1.778 m), weight 69.9 kg (154 lb), SpO2 97 %.  PHYSICAL EXAMINATION:   Physical Exam  GENERAL:  83 y.o.-year-old patient lying in the bed with no acute distress.  EYES: Pupils equal, round, reactive to light and accommodation. No scleral icterus. Extraocular muscles intact.  HEENT: Head atraumatic, normocephalic. Oropharynx and nasopharynx clear.  NECK:  Supple, no jugular venous distention. No thyroid enlargement, no tenderness.  LUNGS: Normal breath sounds bilaterally, no wheezing, rales, rhonchi. No use of accessory muscles of respiration.  CARDIOVASCULAR: S1, S2 normal. No murmurs, rubs, or gallops.  ABDOMEN: Soft, nontender, nondistended. Bowel sounds present. No organomegaly or mass.  EXTREMITIES: No cyanosis, clubbing or edema b/l.    NEUROLOGIC: Cranial nerves II through XII are intact. No focal Motor or sensory deficits b/l.   PSYCHIATRIC: The patient is alert and oriented x 1. Pleasant  SKIN: No obvious rash, lesion, or ulcer.   LABORATORY PANEL:   CBC  Recent Labs Lab 10/24/16 0457  WBC 9.3  HGB 12.0*  HCT 34.1*  PLT 197    ------------------------------------------------------------------------------------------------------------------ Chemistries   Recent Labs Lab 10/23/16 1318 10/24/16 0457  NA 131* 140  K 4.2 3.2*  CL 100* 110  CO2 19* 23  GLUCOSE 566* 86  BUN 21* 20  CREATININE 1.30* 0.93  CALCIUM 8.7* 8.0*  MG  --  1.6*  AST 19  --   ALT 16*  --   ALKPHOS 108  --   BILITOT 0.7  --    ------------------------------------------------------------------------------------------------------------------  Cardiac Enzymes No results for input(s): TROPONINI in the last 168 hours. ------------------------------------------------------------------------------------------------------------------  RADIOLOGY:  No results found.   ASSESSMENT AND PLAN:   * Uncontrolled DM with hyperglycemia On admission and today hypoglycemia  SSI Restarted Levemir at 25 units QHS while his home doses 40 units. We'll decrease Lantus to 18 units today. Counseled patient to not skip any meals.  * Dementia with behavioral disturbance Has legal gaurdian and now in IVC with sitter due to patient refusing to go to facility and brought to ED. Difficult situation for placement  * Hypokalemia Replaced orally  *DVT prophylaxis with Lovenox  All the records are reviewed and case discussed with Care Management/Social Worker Management plans discussed with the patient, family and they are in agreement.  CODE STATUS: FULL CODE  DVT Prophylaxis: SCDs  TOTAL TIME TAKING CARE OF THIS PATIENT: 30 minutes.   POSSIBLE D/C IN 1-2 DAYS, DEPENDING ON CLINICAL CONDITION.  Milagros Loll R M.D on 81/13/2018 at 1:46 PM  Between 7am to 6pm - Pager - [phone]  After 6pm go to [email or web address] - password [email or web address]  [facility] [place] [facility]  Office  [phone]  CC: Primary care physician; Patient, No Pcp Per  Note: This dictation was prepared with Dragon dictation along with smaller phrase technology. Any  transcriptional errors that result from this process are unintentional.

## 2016-10-25 NOTE — Clinical Social Work Note (Signed)
Patient has been denied for all three [facility]iatric [facility] Facilities due to not being appropriate according to each of their intake. Each admission's dept stated that it was more a placement issue related to dementia. CSW will reach out to DSS guardian today to inform her. York SpanielMonica Daphnee Preiss MSW,LCSW [phone]
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
## 2016-10-25 NOTE — Clinical Social Work Note (Signed)
Kylie, the DSS [employer] Supervisor, contacted this CSW and stated that they do not have many options and that they are going to have [facility] come to speak with patient and may have [facility] come to see patient. I explained that patient may very well tell the representative that he will not go. York SpanielMonica Janayla Marik MSW,LCSW [phone]

## 2016-10-25 NOTE — Progress Notes (Signed)
Hypoglycemic Event  CBG: 57  Treatment: 4 oz orange juice  Symptoms: asymptomatic  Follow-up CBG: [id number]:0813 CBG Result:68  Possible Reasons for Event: new levemir dose     Jesus Holland

## 2016-10-25 NOTE — Clinical Social Work Note (Signed)
CSW contacted Marylene Landngela, guardian with DSS, again this afternoon to inquire for any updates. Marylene Landngela stated her supervisor, Jenel LucksKylie, is looking into placement options, one being potentially [facility]. She states that patient's income is too high for SA and too low for a lot of facilities to accept as private pay. York SpanielMonica Kaytelynn Scripter MSW,LCSW [phone]

## 2016-10-25 NOTE — Progress Notes (Signed)
Hypoglycemic Event  CBG: 68  Treatment: 4 oz orange juice Symptoms: asyptomatic  Follow-up CBG: [id number]:0840 CBG Result:99     Oren Binetlivia M Doralyn Kirkes

## 2016-10-25 NOTE — Progress Notes (Signed)
Called and left a voicemail on pts guardian Cleophas Dunker( Angela Riley) work and cell phone to alert her that pt was no longer IVCed.

## 2016-10-26 ENCOUNTER — Inpatient Hospital Stay: Payer: Medicare Other

## 2016-10-26 ENCOUNTER — Observation Stay
Admission: EM | Admit: 2016-10-26 | Discharge: 2016-11-15 | Disposition: A | Discharge status: Home / Self Care | Payer: Medicare Other | Attending: Internal Medicine | Admitting: Internal Medicine

## 2016-10-26 DIAGNOSIS — E1110 Type 2 diabetes mellitus with ketoacidosis without coma: Secondary | ICD-10-CM | POA: Insufficient documentation

## 2016-10-26 DIAGNOSIS — E1165 Type 2 diabetes mellitus with hyperglycemia: Principal | ICD-10-CM | POA: Insufficient documentation

## 2016-10-26 DIAGNOSIS — R739 Hyperglycemia, unspecified: Secondary | ICD-10-CM

## 2016-10-26 DIAGNOSIS — I700 Atherosclerosis of aorta: Secondary | ICD-10-CM | POA: Insufficient documentation

## 2016-10-26 DIAGNOSIS — E860 Dehydration: Secondary | ICD-10-CM | POA: Insufficient documentation

## 2016-10-26 DIAGNOSIS — E871 Hypo-osmolality and hyponatremia: Secondary | ICD-10-CM | POA: Insufficient documentation

## 2016-10-26 DIAGNOSIS — I10 Essential (primary) hypertension: Secondary | ICD-10-CM | POA: Insufficient documentation

## 2016-10-26 DIAGNOSIS — Z046 Encounter for general psychiatric examination, requested by authority: Secondary | ICD-10-CM

## 2016-10-26 DIAGNOSIS — G309 Alzheimer's disease, unspecified: Secondary | ICD-10-CM | POA: Insufficient documentation

## 2016-10-26 DIAGNOSIS — F29 Unspecified psychosis not due to a substance or known physiological condition: Secondary | ICD-10-CM

## 2016-10-26 DIAGNOSIS — F0281 Dementia in other diseases classified elsewhere with behavioral disturbance: Secondary | ICD-10-CM | POA: Insufficient documentation

## 2016-10-26 DIAGNOSIS — R4689 Other symptoms and signs involving appearance and behavior: Secondary | ICD-10-CM

## 2016-10-26 LAB — CBC
HEMATOCRIT: 43 % (ref 40.0–52.0)
HEMOGLOBIN: 14.5 g/dL (ref 13.0–18.0)
MCH: 30.7 pg (ref 26.0–34.0)
MCHC: 33.8 g/dL (ref 32.0–36.0)
MCV: 90.7 fL (ref 80.0–100.0)
Platelets: 229 10*3/uL (ref 150–440)
RBC: 4.74 MIL/uL (ref 4.40–5.90)
RDW: 13.3 % (ref 11.5–14.5)
WBC: 9.4 10*3/uL (ref 3.8–10.6)

## 2016-10-26 LAB — GLUCOSE, CAPILLARY
GLUCOSE-CAPILLARY: 210 mg/dL — AB (ref 65–99)
GLUCOSE-CAPILLARY: 349 mg/dL — AB (ref 65–99)

## 2016-10-26 MED ORDER — LORAZEPAM 1 MG PO TABS
1.0000 mg | ORAL_TABLET | Freq: Once | ORAL | Status: AC
  Administered 2016-10-26: 1 mg via ORAL
  Filled 2016-10-26: qty 1

## 2016-10-26 MED ORDER — AMLODIPINE BESYLATE 5 MG PO TABS
2.5000 mg | ORAL_TABLET | Freq: Every day | ORAL | Status: DC
  Administered 2016-10-26: 2.5 mg via ORAL
  Filled 2016-10-26: qty 1

## 2016-10-26 MED ORDER — LORAZEPAM 0.5 MG PO TABS: 0.5000 mg | ORAL_TABLET | Freq: Three times a day (TID) | ORAL | 0 refills | Status: DC | PRN

## 2016-10-26 MED ORDER — AMLODIPINE BESYLATE 2.5 MG PO TABS: 2.5000 mg | ORAL_TABLET | Freq: Every day | ORAL | 0 refills | Status: DC

## 2016-10-26 MED ORDER — LORAZEPAM 2 MG/ML IJ SOLN
1.0000 mg | Freq: Four times a day (QID) | INTRAMUSCULAR | Status: DC | PRN
  Filled 2016-10-26: qty 1

## 2016-10-26 MED ORDER — HYDROCODONE-ACETAMINOPHEN 10-325 MG PO TABS: 1.0000 | ORAL_TABLET | Freq: Four times a day (QID) | ORAL | 0 refills | Status: DC | PRN

## 2016-10-26 MED ORDER — LOSARTAN POTASSIUM 100 MG PO TABS: 100.0000 mg | ORAL_TABLET | Freq: Every day | ORAL | 0 refills | Status: DC

## 2016-10-26 MED ORDER — LANTUS SOLOSTAR 100 UNIT/ML ~~LOC~~ SOPN: 30.0000 [IU] | PEN_INJECTOR | Freq: Every day | SUBCUTANEOUS | 0 refills | Status: DC
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
## 2016-10-26 NOTE — Discharge Instructions (Signed)
Diabetic diet    Activity as tolerated
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
## 2016-10-26 NOTE — ED Triage Notes (Signed)
Pt arrives to ER with [employer] officer under IVC. Pt was released from hospital today, and sent to [facility]. Pt states he does not like [facility] and has been verbally aggressive with staff. Pt loud, grandiose, aggravated at this time of triage.

## 2016-10-26 NOTE — NC FL2 (Addendum)
  [place] MEDICAID FL2 LEVEL OF CARE SCREENING TOOL     IDENTIFICATION  Patient Name: Jesus Holland Birthdate: Dec 17, 1928 Sex: male Admission Date (Current Location): 10/23/2016  [facility] and [place] Number:  Chiropodist and Address:  [facility], [address] [place], [place] [address]      Provider Number: [id number]  Attending Physician Name and Address:  Milagros Loll, MD  Relative Name and Phone Number:       Current Level of Care: Hospital Recommended Level of Care: Memory Care Prior Approval Number:    Date Approved/Denied:   PASRR Number:    Discharge Plan:  (memory care)    Current Diagnoses: Dementia/Alzheimers Hyperglycemia  Orientation RESPIRATION BLADDER Height & Weight     Self, Place, Situation  Normal Continent Weight: 154 lb (69.9 kg) Height:   (177.8 cm)  BEHAVIORAL SYMPTOMS/MOOD NEUROLOGICAL BOWEL NUTRITION STATUS   (none at hospital)  (none) Continent Diet (heart healthy;carb modified)  AMBULATORY STATUS COMMUNICATION OF NEEDS Skin   Limited Assist Verbally Normal                       Personal Care Assistance Level of Assistance  Bathing, Dressing, Feeding Bathing Assistance: Limited assistance Feeding assistance: Limited assistance Dressing Assistance: Limited assistance     Functional Limitations Info   (none)          SPECIAL CARE FACTORS FREQUENCY                       Contractures Contractures Info: Not present    Additional Factors Info  Code Status, Allergies Code Status Info: full Allergies Info: asa escitalopram; metformin, quinine              DISCHARGE MEDICATIONS:      Current Discharge Medication List       START taking these medications   Details  amLODipine (NORVASC) 2.5 MG tablet Take 1 tablet (2.5 mg total) by mouth daily. Qty: 30 tablet, Refills: 0    LORazepam (ATIVAN) 0.5 MG tablet Take 1 tablet (0.5 mg total) by mouth every 8  (eight) hours as needed for anxiety. Qty: 15 tablet, Refills: 0         CONTINUE these medications which have CHANGED   Details  HYDROcodone-acetaminophen (NORCO) 10-325 MG tablet Take 1 tablet by mouth every 6 (six) hours as needed. Qty: 20 tablet, Refills: 0    LANTUS SOLOSTAR 100 UNIT/ML Solostar Pen Inject 30 Units into the skin daily. Qty: 15 mL, Refills: 0    losartan (COZAAR) 100 MG tablet Take 1 tablet (100 mg total) by mouth daily. Qty: 30 tablet, Refills: 0    lorazepam gel . /ml gel apply .5ml topically to wrist twice daily for agitation     [name], LCSW

## 2016-10-26 NOTE — Progress Notes (Signed)
10/26/2016  4:07 PM  Jesus Holland to be D/C'd Nursing Home per MD order.  Discussed prescriptions and follow up appointments with the patient. Prescriptions given to patient, medication list explained in detail. Pt verbalized understanding.  Allergies as of 10/26/2016      Reactions   Aspirin Nausea And Vomiting   Escitalopram Other (See Comments)   Metformin Other (See Comments)   Quinine Nausea And Vomiting      Medication List    TAKE these medications   amLODipine 2.5 MG tablet Commonly known as:  NORVASC Take 1 tablet (2.5 mg total) by mouth daily.   HYDROcodone-acetaminophen 10-325 MG tablet Commonly known as:  NORCO Take 1 tablet by mouth every 6 (six) hours as needed. What changed:  when to take this  reasons to take this   LANTUS SOLOSTAR 100 UNIT/ML Solostar Pen Generic drug:  Insulin Glargine Inject 30 Units into the skin daily. What changed:  how much to take   LORazepam 0.5 MG tablet Commonly known as:  ATIVAN Take 1 tablet (0.5 mg total) by mouth every 8 (eight) hours as needed for anxiety.   losartan 100 MG tablet Commonly known as:  COZAAR Take 1 tablet (100 mg total) by mouth daily.            Discharge Care Instructions        Start     Ordered   10/27/16 0000  amLODipine (NORVASC) 2.5 MG tablet  Daily     10/26/16 0909   10/26/16 0000  HYDROcodone-acetaminophen (NORCO) 10-325 MG tablet  Every 6 hours PRN     10/26/16 1322   10/26/16 0000  LANTUS SOLOSTAR 100 UNIT/ML Solostar Pen  Daily     10/26/16 1322   10/26/16 0000  losartan (COZAAR) 100 MG tablet  Daily     10/26/16 1322   10/26/16 0000  LORazepam (ATIVAN) 0.5 MG tablet  Every 8 hours PRN     10/26/16 1322      Vitals:   10/26/16 0609 10/26/16 1416  BP: (!) 160/86 (!) 160/82  Pulse: 72 83  Resp: 18 18  Temp: 97.8 F (36.6 C) 98.1 F (36.7 C)  SpO2: 100% 99%    Skin clean, dry and intact without evidence of skin break down, no evidence of skin tears noted. IV catheter  discontinued intact. Site without signs and symptoms of complications. Dressing and pressure applied. Pt denies pain at this time. No complaints noted.  An After Visit Summary was printed and given to the patient. Patient escorted via [facility] with security, and D/C to [facility]. Bradly Chris

## 2016-10-26 NOTE — Progress Notes (Signed)
Inpatient Diabetes Program Recommendations  AACE/ADA: New Consensus Statement on Inpatient Glycemic Control (2015)  Target Ranges:  Prepandial:   less than 140 mg/dL      Peak postprandial:   less than 180 mg/dL (1-2 hours)      Critically ill patients:  140 - 180 mg/dL   Results for GERVIS, GABA (MRN [id number]) as of 10/26/2016 09:44  Ref. Range 10/25/2016 07:43 10/25/2016 08:13 10/25/2016 08:40 10/25/2016 09:09 10/25/2016 11:37 10/25/2016 16:26 10/25/2016 21:40 10/26/2016 07:50  Glucose-Capillary Latest Ref Range: 65 - 99 mg/dL 57 (L) 68 99 147 (H) 829 (H) 266 (H) 273 (H) 210 (H)   Review of Glycemic Control  Diabetes history: DM2 Outpatient Diabetes medications: Levemir 44 units daily Current orders for Inpatient glycemic control: Levemir 18 units QHS, Novolog 0-9 units TID with meals  Inpatient Diabetes Program Recommendations: Insulin - Basal: Noted Levemir was decreased from 25 to 18 units on 10/25/16 and patient received Levemir 18 units last night. Please consider increasing Levemir to 20 units QHS. Correction (SSI): Please consider ordering Novolog 0-5 units QHS for bedtime correction. Insulin - Meal Coverage: If post prandial glucose continues to be greater than 180 mg/dl, please consider ordering Novolog 3 units TID with meals for meal coverage if patient eats at least 50% of meals.  Thanks, Orlando Penner, RN, MSN, CDE Diabetes Coordinator Inpatient Diabetes Program [phone] (Team Pager from 8am to 5pm)

## 2016-10-26 NOTE — Clinical Social Work Note (Signed)
After much time, CSW was able to coordinate getting what was needed for patient to admit to [facility] today. [facility] has reviewed all information needed and they are coming to pick patient up. Pateint's DSS Guardian, Kylie, found the bed at [facility] and is aware that CSW has coordinated to get him there today,  [name] MSW,LCSW [phone]
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
## 2016-10-26 NOTE — Discharge Summary (Addendum)
[facility] - [place] [facility]   PATIENT NAME: Jesus Holland    MR#:  [id number]  DATE OF BIRTH:  1928/12/28  DATE OF ADMISSION:  10/23/2016 ADMITTING PHYSICIAN: Altamese Dilling, MD  DATE OF DISCHARGE: 10/26/2016  PRIMARY CARE PHYSICIAN: Patient, No Pcp Per   ADMISSION DIAGNOSIS:  Dehydration [E86.0] Hyperglycemia [R73.9] Involuntary commitment [Z04.6]  DISCHARGE DIAGNOSIS:  Principal Problem:   Dementia, Alzheimer's, with behavior disturbance Active Problems:   Hyperglycemia   SECONDARY DIAGNOSIS:   Past Medical History:  Diagnosis Date  . Arthritis   . Diabetes mellitus without complication (HCC)      ADMITTING HISTORY  HISTORY OF PRESENT ILLNESS: Jesus Holland  is a 83 y.o. male with a known history of Diabetes, arthritis- have dementia and he lives in a motel. As per him he was ready to go to assisted living place but they were refusing to take his 2 dogs with him so he chose to leave in a motel. He is blaming all this to Department of Social Services as he tried multiple times and could not get appointments. He was supposed to take insulin for his diabetes but not very sure if he is taking one time or not. Currently he is alert and oriented but appears to be having some confusion over some dementia about his surroundings and not able to say clearly why he did not take insulin. Department of Social Services is called police on him to bring him to emergency room. In ER he was kept on involuntary commitment because of his possible dementia and his blood sugar was noted more than 500. No signs of ketoacidosis so started on IV fluids and given to hospitalist team.  HOSPITAL COURSE:   * Uncontrolled DM with hyperglycemia On admission and and later hypoglycemia Patient has been restarted on Lantus and blood sugars are well controlled. Diabetic diet at discharge.  * Dementia with behavioral disturbance Patient is much improved. Is oriented. Has  legal guardian. Unable to make his own decisions. Had IVC in place and the sitter which has been discontinued by Dr. Mat Carne packs.  * Hypokalemia Replaced orally  Patient is stable for discharge from the hospital.  CONSULTS OBTAINED:  Treatment Team:  Clapacs, Jackquline Denmark, MD  DRUG ALLERGIES:   Allergies  Allergen Reactions  . Aspirin Nausea And Vomiting  . Escitalopram Other (See Comments)  . Metformin Other (See Comments)  . Quinine Nausea And Vomiting    DISCHARGE MEDICATIONS:   Current Discharge Medication List    START taking these medications   Details  amLODipine (NORVASC) 2.5 MG tablet Take 1 tablet (2.5 mg total) by mouth daily. Qty: 30 tablet, Refills: 0    LORazepam (ATIVAN) 0.5 MG tablet Take 1 tablet (0.5 mg total) by mouth every 8 (eight) hours as needed for anxiety. Qty: 15 tablet, Refills: 0      CONTINUE these medications which have CHANGED   Details  HYDROcodone-acetaminophen (NORCO) 10-325 MG tablet Take 1 tablet by mouth every 6 (six) hours as needed. Qty: 20 tablet, Refills: 0    LANTUS SOLOSTAR 100 UNIT/ML Solostar Pen Inject 30 Units into the skin daily. Qty: 15 mL, Refills: 0    losartan (COZAAR) 100 MG tablet Take 1 tablet (100 mg total) by mouth daily. Qty: 30 tablet, Refills: 0        Today   VITAL SIGNS:  Blood pressure (!) 160/86, pulse 72, temperature 97.8 F (36.6 C), temperature source Oral, resp. rate 18, height 5'  10" (1.778 m), weight 69.9 kg (154 lb), SpO2 100 %.  I/O:   Intake/Output Summary (Last 24 hours) at 10/26/16 1212 Last data filed at 10/26/16 1126  Gross per 24 hour  Intake              600 ml  Output             1600 ml  Net            -1000 ml    PHYSICAL EXAMINATION:  Physical Exam  GENERAL:  83 y.o.-year-old patient lying in the bed with no acute distress.  LUNGS: Normal breath sounds bilaterally, no wheezing, rales,rhonchi or crepitation. No use of accessory muscles of respiration.  CARDIOVASCULAR: S1,  S2 normal. No murmurs, rubs, or gallops.  ABDOMEN: Soft, non-tender, non-distended. Bowel sounds present. No organomegaly or mass.  NEUROLOGIC: Moves all 4 extremities. PSYCHIATRIC: The patient is alert and awake SKIN: No obvious rash, lesion, or ulcer.   DATA REVIEW:   CBC  Recent Labs Lab 10/24/16 0457  WBC 9.3  HGB 12.0*  HCT 34.1*  PLT 197    Chemistries   Recent Labs Lab 10/23/16 1318 10/24/16 0457  NA 131* 140  K 4.2 3.2*  CL 100* 110  CO2 19* 23  GLUCOSE 566* 86  BUN 21* 20  CREATININE 1.30* 0.93  CALCIUM 8.7* 8.0*  MG  --  1.6*  AST 19  --   ALT 16*  --   ALKPHOS 108  --   BILITOT 0.7  --     Cardiac Enzymes No results for input(s): TROPONINI in the last 168 hours.  Microbiology Results  Results for orders placed or performed in visit on 05/13/13  Urine culture     Status: None   Collection Time: 05/18/13  5:39 PM  Result Value Ref Range Status   Micro Text Report   Final       SOURCE: SOURCE NOT INDICATED    COMMENT                   MIXED BACTERIAL ORGANISMS   COMMENT                   RESULTS SUGGESTIVE OF CONTAMINATION   ANTIBIOTIC                                                        RADIOLOGY:  No results found.  Follow up with PCP in 1 week.  Management plans discussed with the patient, family and they are in agreement.  CODE STATUS:     Code Status Orders        Start     Ordered   10/23/16 1843  Full code  Continuous     10/23/16 1842    Code Status History    Date Active Date Inactive Code Status Order ID Comments User Context   This patient has a current code status but no historical code status.      TOTAL TIME TAKING CARE OF THIS PATIENT ON DAY OF DISCHARGE: more than 30 minutes.   Milagros Loll R M.D on 10/26/2016 at 12:12 PM  Between 7am to 6pm - Pager - [phone]  After 6pm go to [email or web address] - password [email or web address] [facility]  [facility]  Office  [phone]  CC: Primary care physician;  Patient, No Pcp Per  Note: This dictation was prepared with Dragon dictation along with smaller phrase technology. Any transcriptional errors that result from this process are unintentional.

## 2016-10-26 NOTE — Clinical Social Work Placement (Signed)
   CLINICAL SOCIAL WORK PLACEMENT  NOTE  Date:  10/26/2016  Patient Details  Name: Evian Salguero MRN: [id number] Date of Birth: 11-Dec-1928  Clinical Social Work is seeking post-discharge placement for this patient at the Assisted Living Facility level of care (*CSW will initial, date and re-position this form in  chart as items are completed):   (n/a)   Patient/family provided with [facility] Clinical Social Work Department's list of facilities offering this level of care within the geographic area requested by the patient (or if unable, by the patient's family).   (n/a as DSS is guardian)   Patient/family informed of their freedom to choose among providers that offer the needed level of care, that participate in Medicare, Medicaid or managed care program needed by the patient, have an available bed and are willing to accept the patient.   (n/a)   Patient/family informed of [facility]'s ownership interest in [facility] and [facility], as well as of the fact that they are under no obligation to receive care at these facilities.  PASRR submitted to EDS on 10/26/16     PASRR number received on 10/26/16     Existing PASRR number confirmed on       FL2 transmitted to all facilities in geographic area requested by pt/family on       FL2 transmitted to all facilities within larger geographic area on       Patient informed that his/her managed care company has contracts with or will negotiate with certain facilities, including the following:         (n/a)   Patient/family informed of bed offers received.  Patient chooses bed at  (DSS Guardian [facility])     Physician recommends and patient chooses bed at  (ALF)    Patient to be transferred to  (ALF) on 10/26/16.  Patient to be transferred to facility by  [facility])     Patient family notified on 10/26/16 of transfer.  Name of family member notified:   Jenel Lucks with DSS)     PHYSICIAN       Additional  Comment:    _______________________________________________ [name], LCSW 10/26/2016, 3:18 PM

## 2016-10-26 NOTE — Progress Notes (Signed)
10/26/2016  3:41 PM  Notified pt that guardian was coming to pick up pt to bring him to [facility] for discharge.  Pt became agitated, insisting he needed his clothes and $3000 in cash that he claims he brought to the hospital.  Notified pt that his DSS guardian has these personal belongings.  He did not accept this, despite speaking with his DSS guardian on the phone, only cursing and yelling, demanding that he brought his clothes.  Will continue to attempt resolution and monitor and assess pt.  Bradly Chris, RN

## 2016-10-27 ENCOUNTER — Encounter: Payer: Self-pay | Admitting: Internal Medicine

## 2016-10-27 LAB — COMPREHENSIVE METABOLIC PANEL
ALBUMIN: 3.9 g/dL (ref 3.5–5.0)
ALK PHOS: 111 U/L (ref 38–126)
ALT: 15 U/L — AB (ref 17–63)
AST: 22 U/L (ref 15–41)
Anion gap: 10 (ref 5–15)
BUN: 22 mg/dL — AB (ref 6–20)
CALCIUM: 8.6 mg/dL — AB (ref 8.9–10.3)
CO2: 22 mmol/L (ref 22–32)
CREATININE: 1.15 mg/dL (ref 0.61–1.24)
Chloride: 99 mmol/L — ABNORMAL LOW (ref 101–111)
GFR calc non Af Amer: 56 mL/min — ABNORMAL LOW (ref >60)
GLUCOSE: 681 mg/dL — AB (ref 65–99)
Potassium: 4.9 mmol/L (ref 3.5–5.1)
SODIUM: 131 mmol/L — AB (ref 135–145)
Total Bilirubin: 0.5 mg/dL (ref 0.3–1.2)
Total Protein: 7.3 g/dL (ref 6.5–8.1)

## 2016-10-27 LAB — URINE DRUG SCREEN, QUALITATIVE (ARMC ONLY)
AMPHETAMINES, UR SCREEN: NOT DETECTED
BENZODIAZEPINE, UR SCRN: NOT DETECTED
Barbiturates, Ur Screen: NOT DETECTED
Cannabinoid 50 Ng, Ur ~~LOC~~: NOT DETECTED
Cocaine Metabolite,Ur ~~LOC~~: NOT DETECTED
MDMA (Ecstasy)Ur Screen: NOT DETECTED
Methadone Scn, Ur: NOT DETECTED
OPIATE, UR SCREEN: POSITIVE — AB
Phencyclidine (PCP) Ur S: NOT DETECTED
TRICYCLIC, UR SCREEN: NOT DETECTED

## 2016-10-27 LAB — GLUCOSE, CAPILLARY
GLUCOSE-CAPILLARY: 200 mg/dL — AB (ref 65–99)
GLUCOSE-CAPILLARY: 220 mg/dL — AB (ref 65–99)
Glucose-Capillary: 194 mg/dL — ABNORMAL HIGH (ref 65–99)
Glucose-Capillary: 212 mg/dL — ABNORMAL HIGH (ref 65–99)
Glucose-Capillary: 356 mg/dL — ABNORMAL HIGH (ref 65–99)
Glucose-Capillary: >600 mg/dL (ref 65–99)

## 2016-10-27 LAB — MRSA PCR SCREENING: MRSA BY PCR: NEGATIVE

## 2016-10-27 LAB — ETHANOL: Alcohol, Ethyl (B): <5 mg/dL (ref <5)

## 2016-10-27 LAB — SALICYLATE LEVEL: Salicylate Lvl: <7 mg/dL (ref 2.8–30.0)

## 2016-10-27 LAB — ACETAMINOPHEN LEVEL

## 2016-10-27 MED ORDER — ACETAMINOPHEN 650 MG RE SUPP: 650.0000 mg | Freq: Four times a day (QID) | RECTAL | Status: DC | PRN

## 2016-10-27 MED ORDER — SODIUM CHLORIDE 0.9 % IV SOLN
INTRAVENOUS | Status: DC
  Administered 2016-10-27: 05:00:00 via INTRAVENOUS

## 2016-10-27 MED ORDER — AMLODIPINE BESYLATE 5 MG PO TABS
2.5000 mg | ORAL_TABLET | Freq: Every day | ORAL | Status: DC
  Administered 2016-10-27 – 2016-11-15 (×20): 2.5 mg via ORAL
  Filled 2016-10-27 (×20): qty 1

## 2016-10-27 MED ORDER — ONDANSETRON HCL 4 MG/2ML IJ SOLN: 4.0000 mg | Freq: Four times a day (QID) | INTRAMUSCULAR | Status: DC | PRN

## 2016-10-27 MED ORDER — RISPERIDONE 0.5 MG PO TABS
0.5000 mg | ORAL_TABLET | Freq: Every day | ORAL | Status: DC
  Filled 2016-10-27: qty 1

## 2016-10-27 MED ORDER — HYDROCODONE-ACETAMINOPHEN 5-325 MG PO TABS
ORAL_TABLET | ORAL | Status: DC
Start: 2016-10-27 — End: 2016-10-27
  Filled 2016-10-27: qty 1

## 2016-10-27 MED ORDER — SENNOSIDES-DOCUSATE SODIUM 8.6-50 MG PO TABS: 1.0000 | ORAL_TABLET | Freq: Every evening | ORAL | Status: DC | PRN

## 2016-10-27 MED ORDER — ENOXAPARIN SODIUM 40 MG/0.4ML ~~LOC~~ SOLN
40.0000 mg | SUBCUTANEOUS | Status: DC
  Administered 2016-10-27 – 2016-11-15 (×19): 40 mg via SUBCUTANEOUS
  Filled 2016-10-27 (×20): qty 0.4

## 2016-10-27 MED ORDER — INSULIN ASPART 100 UNIT/ML ~~LOC~~ SOLN
0.0000 [IU] | Freq: Three times a day (TID) | SUBCUTANEOUS | Status: DC
Start: 2016-10-27 — End: 2016-11-15
  Administered 2016-10-27: 3 [IU] via SUBCUTANEOUS
  Administered 2016-10-27: 15 [IU] via SUBCUTANEOUS
  Administered 2016-10-27: 5 [IU] via SUBCUTANEOUS
  Administered 2016-10-28: 11 [IU] via SUBCUTANEOUS
  Administered 2016-10-28: 2 [IU] via SUBCUTANEOUS
  Administered 2016-10-28: 5 [IU] via SUBCUTANEOUS
  Administered 2016-10-29: 11 [IU] via SUBCUTANEOUS
  Administered 2016-10-29: 2 [IU] via SUBCUTANEOUS
  Administered 2016-10-29: 11 [IU] via SUBCUTANEOUS
  Administered 2016-10-30: 3 [IU] via SUBCUTANEOUS
  Administered 2016-10-30: 2 [IU] via SUBCUTANEOUS
  Administered 2016-10-30: 15 [IU] via SUBCUTANEOUS
  Administered 2016-10-31: 2 [IU] via SUBCUTANEOUS
  Administered 2016-10-31: 3 [IU] via SUBCUTANEOUS
  Administered 2016-10-31: 5 [IU] via SUBCUTANEOUS
  Administered 2016-11-01 (×3): 2 [IU] via SUBCUTANEOUS
  Administered 2016-11-02 (×2): 3 [IU] via SUBCUTANEOUS
  Administered 2016-11-03: 5 [IU] via SUBCUTANEOUS
  Administered 2016-11-04: 8 [IU] via SUBCUTANEOUS
  Administered 2016-11-05: 2 [IU] via SUBCUTANEOUS
  Administered 2016-11-06 – 2016-11-07 (×2): 3 [IU] via SUBCUTANEOUS
  Administered 2016-11-07: 2 [IU] via SUBCUTANEOUS
  Administered 2016-11-07: 3 [IU] via SUBCUTANEOUS
  Administered 2016-11-08 (×2): 2 [IU] via SUBCUTANEOUS
  Administered 2016-11-08: 8 [IU] via SUBCUTANEOUS
  Administered 2016-11-09 (×2): 2 [IU] via SUBCUTANEOUS
  Administered 2016-11-10: 11 [IU] via SUBCUTANEOUS
  Administered 2016-11-11 – 2016-11-12 (×3): 3 [IU] via SUBCUTANEOUS
  Administered 2016-11-12 (×2): 2 [IU] via SUBCUTANEOUS
  Administered 2016-11-13: 5 [IU] via SUBCUTANEOUS
  Administered 2016-11-13: 2 [IU] via SUBCUTANEOUS
  Administered 2016-11-14 (×2): 3 [IU] via SUBCUTANEOUS
  Administered 2016-11-15: 11 [IU] via SUBCUTANEOUS
  Filled 2016-10-27 (×44): qty 1

## 2016-10-27 MED ORDER — HYDROCODONE-ACETAMINOPHEN 5-325 MG PO TABS
2.0000 | ORAL_TABLET | Freq: Once | ORAL | Status: AC
  Administered 2016-10-27: 2 via ORAL
  Filled 2016-10-27: qty 2

## 2016-10-27 MED ORDER — ACETAMINOPHEN 325 MG PO TABS
650.0000 mg | ORAL_TABLET | Freq: Four times a day (QID) | ORAL | Status: DC | PRN
  Administered 2016-11-05 – 2016-11-09 (×2): 650 mg via ORAL
  Filled 2016-10-27 (×4): qty 2

## 2016-10-27 MED ORDER — INSULIN ASPART 100 UNIT/ML ~~LOC~~ SOLN
0.0000 [IU] | Freq: Every day | SUBCUTANEOUS | Status: DC
Start: 2016-10-27 — End: 2016-11-15
  Administered 2016-10-27: 2 [IU] via SUBCUTANEOUS
  Administered 2016-10-30: 4 [IU] via SUBCUTANEOUS
  Administered 2016-11-01: 2 [IU] via SUBCUTANEOUS
  Administered 2016-11-05: 4 [IU] via SUBCUTANEOUS
  Administered 2016-11-06: 3 [IU] via SUBCUTANEOUS
  Administered 2016-11-07: 2 [IU] via SUBCUTANEOUS
  Administered 2016-11-09: 3 [IU] via SUBCUTANEOUS
  Administered 2016-11-10: 2 [IU] via SUBCUTANEOUS
  Administered 2016-11-14: 3 [IU] via SUBCUTANEOUS
  Filled 2016-10-27 (×8): qty 1

## 2016-10-27 MED ORDER — RISPERIDONE 0.5 MG PO TABS
0.5000 mg | ORAL_TABLET | Freq: Every day | ORAL | Status: DC
  Administered 2016-10-27 – 2016-10-29 (×3): 0.5 mg via ORAL
  Filled 2016-10-27 (×3): qty 1

## 2016-10-27 MED ORDER — INSULIN ASPART 100 UNIT/ML ~~LOC~~ SOLN
4.0000 [IU] | Freq: Three times a day (TID) | SUBCUTANEOUS | Status: DC
  Administered 2016-10-27 – 2016-10-29 (×8): 4 [IU] via SUBCUTANEOUS
  Filled 2016-10-27 (×8): qty 1

## 2016-10-27 MED ORDER — INSULIN ASPART 100 UNIT/ML ~~LOC~~ SOLN
10.0000 [IU] | Freq: Once | SUBCUTANEOUS | Status: AC
  Administered 2016-10-27: 10 [IU] via INTRAVENOUS
  Filled 2016-10-27: qty 1

## 2016-10-27 MED ORDER — INSULIN GLARGINE 100 UNIT/ML ~~LOC~~ SOLN
30.0000 [IU] | Freq: Every day | SUBCUTANEOUS | Status: DC
  Administered 2016-10-27: 30 [IU] via SUBCUTANEOUS
  Filled 2016-10-27 (×2): qty 0.3

## 2016-10-27 MED ORDER — FLUTICASONE PROPIONATE 50 MCG/ACT NA SUSP
2.0000 | Freq: Every day | NASAL | Status: DC
  Administered 2016-10-27 – 2016-11-14 (×12): 2 via NASAL
  Filled 2016-10-27: qty 16

## 2016-10-27 MED ORDER — HYDROCODONE-ACETAMINOPHEN 10-325 MG PO TABS
1.0000 | ORAL_TABLET | Freq: Four times a day (QID) | ORAL | Status: DC | PRN
  Administered 2016-10-27 – 2016-11-15 (×66): 1 via ORAL
  Filled 2016-10-27 (×70): qty 1

## 2016-10-27 MED ORDER — LORAZEPAM 0.5 MG PO TABS
0.5000 mg | ORAL_TABLET | Freq: Three times a day (TID) | ORAL | Status: DC | PRN
  Administered 2016-10-27 – 2016-11-11 (×10): 0.5 mg via ORAL
  Filled 2016-10-27 (×12): qty 1

## 2016-10-27 MED ORDER — ONDANSETRON HCL 4 MG PO TABS
4.0000 mg | ORAL_TABLET | Freq: Four times a day (QID) | ORAL | Status: DC | PRN
Start: 2016-10-27 — End: 2016-11-15
  Administered 2016-10-29: 4 mg via ORAL
  Filled 2016-10-27: qty 1

## 2016-10-27 MED ORDER — LOSARTAN POTASSIUM 50 MG PO TABS
100.0000 mg | ORAL_TABLET | Freq: Every day | ORAL | Status: DC
  Administered 2016-10-27 – 2016-11-15 (×20): 100 mg via ORAL
  Filled 2016-10-27 (×20): qty 2

## 2016-10-27 MED ORDER — SODIUM CHLORIDE 0.9 % IV BOLUS (SEPSIS)
500.0000 mL | INTRAVENOUS | Status: AC
  Administered 2016-10-27: 500 mL via INTRAVENOUS

## 2016-10-27 NOTE — ED Notes (Signed)
Patient is agitated about current living situation. Pt reports that the DSS agent had him made a ward of the state and took his took dogs away. Patient agitated, asking what RN and the hospital can to to fix the patient's living situation and how he can regain custody of his dogs.

## 2016-10-27 NOTE — ED Notes (Signed)
POCT CBG > 500
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
## 2016-10-27 NOTE — Clinical Social Work Note (Signed)
CSW received consult that patient is from [facility], is under IVC, and has a DSS legal guardian. CSW will assess when able.   Argentina Ponder, MSW, Theresia Majors [phone]

## 2016-10-27 NOTE — Progress Notes (Signed)
Patient presently in the room alert to self and place, mood calm, sitter at bedside, denies any pain at this  time.

## 2016-10-27 NOTE — ED Notes (Signed)
RN to bedside. Pt had attempted to urinate in toilet and urinated on floor and on self.  Patient cleaned and changed, floor wiped clean from urine, patient given urinal and instructed on use.

## 2016-10-27 NOTE — Progress Notes (Signed)
No IV access , patient refused my attempt to get a new access, Md aware.

## 2016-10-27 NOTE — ED Notes (Signed)
Patient spilled urine from urinal on self and onto bed. Full linen change performed, patient cleaned and new clothing and brief placed on patient.

## 2016-10-27 NOTE — ED Provider Notes (Addendum)
[facility] Emergency Department Provider Note  ____________________________________________   First MD Initiated Contact with Patient 10/27/16 0114     (approximate)  I have reviewed the triage vital signs and the nursing notes.   HISTORY  Chief Complaint Psychiatric Evaluation  Level 5 caveat:  history/ROS may be limited by chronic dementia   HPI Jesus Holland is a 83 y.o. male who has a legal guardian under DSS who presents with law enforcement for evaluation of aggressive behavior.  In short, he was recently brought to the emergency department and was apparently deemed unfit to care for himself and was also admitted for hyperglycemia.  He was placed under the care of DSS and was discharged to [facility] within the last couple of days.  [facility] called Patent examiner tonight because of aggressive behavior toward staff.  Officer Pride informed to me that he witnessed and the patient becoming very loud and verbally abusive with some physical hostility although he did not strike anyone.  He was placed under involuntary commitment and brought to the emergency department.  Upon arrival the patient is reporting some chronic pain for which he is prescribed hydrocodone 10-325 by Dr. Judithann Sheen, his PCP, but he has no other acute complaints except that he is very unhappy with his current living situation and he is worried about his 2 dogs.  He is unable to quantify the degree of severity of the situation.  He denies suicidal ideation and homicidal ideation but admits to a very angry when people tell him what to do.  He also understands that "others have control over me".  He states that he wants to go back to his home country [place]   Past Medical History:  Diagnosis Date  . Arthritis   . Diabetes mellitus without complication [facility])     Patient Active Problem List   Diagnosis Date Noted  . Hyperglycemia 10/23/2016  . Dementia, Alzheimer's, with  behavior disturbance 10/23/2016    Past Surgical History:  Procedure Laterality Date  . none      Prior to Admission medications   Medication Sig Start Date End Date Taking? Authorizing Provider  amLODipine (NORVASC) 2.5 MG tablet Take 1 tablet (2.5 mg total) by mouth daily. 10/27/16  Yes Sudini, Wardell Heath, MD  HYDROcodone-acetaminophen (NORCO) 10-325 MG tablet Take 1 tablet by mouth every 6 (six) hours as needed. 10/26/16  Yes Sudini, Wardell Heath, MD  LANTUS SOLOSTAR 100 UNIT/ML Solostar Pen Inject 30 Units into the skin daily. 10/26/16  Yes Sudini, Wardell Heath, MD  LORazepam (ATIVAN) 0.5 MG tablet Take 1 tablet (0.5 mg total) by mouth every 8 (eight) hours as needed for anxiety. 10/26/16 10/26/17 Yes Sudini, Wardell Heath, MD  losartan (COZAAR) 100 MG tablet Take 1 tablet (100 mg total) by mouth daily. 10/26/16  Yes Milagros Loll, MD    Allergies Aspirin; Escitalopram; Metformin; and Quinine  Family History  Problem Relation Age of Onset  . Family history unknown: Yes    Social History Social History  Substance Use Topics  . Smoking status: Never Smoker  . Smokeless tobacco: Never Used  . Alcohol use No    Review of Systems  Level 5 caveat:  history/ROS may be limited by chronic dementia  ____________________________________________   PHYSICAL EXAM:  VITAL SIGNS: ED Triage Vitals [10/26/16 2344]  Enc Vitals Group     BP (!) 163/121     Pulse Rate (!) 108     Resp (!) 22     Temp  98.8 F (37.1 C)     Temp Source Oral     SpO2 100 %     Weight 69.9 kg (154 lb)     Height 1.778 m ( )     Head Circumference      Peak Flow      Pain Score 0     Pain Loc      Pain Edu?      Excl. in GC?     Constitutional: Alert and oriented. no acute distress. Eyes: Conjunctivae are normal.  Head: Atraumatic. Nose: No congestion/rhinnorhea. Mouth/Throat: Mucous membranes are moist. Neck: No stridor.  No meningeal signs.   Cardiovascular: Mild tachycardia, regular rhythm. Good peripheral  circulation. Grossly normal heart sounds. Respiratory: Normal respiratory effort.  No retractions. Lungs CTAB. Gastrointestinal: Soft and nontender. No distention.  Musculoskeletal: No lower extremity tenderness nor edema. No gross deformities of extremities. Neurologic:  Normal speech and language. No gross focal neurologic deficits are appreciated.  Skin:  Skin is warm, dry and intact. No rash noted. Psychiatric: Mood and affect are normal at first, but emotions are labile, particularly when he is discussing his living situation.  generally redirectable.  Denies SI and HI  ____________________________________________   LABS (all labs ordered are listed, but only abnormal results are displayed)  Labs Reviewed  COMPREHENSIVE METABOLIC PANEL - Abnormal; Notable for the following:       Result Value   Sodium 131 (*)    Chloride 99 (*)    Glucose, Bld 681 (*)    BUN 22 (*)    Calcium 8.6 (*)    ALT 15 (*)    GFR calc non Af Amer 56 (*)    All other components within normal limits  ACETAMINOPHEN LEVEL - Abnormal; Notable for the following:    Acetaminophen (Tylenol), Serum <10 (*)    All other components within normal limits  URINE DRUG SCREEN, QUALITATIVE ([facility] ONLY) - Abnormal; Notable for the following:    Opiate, Ur Screen POSITIVE (*)    All other components within normal limits  GLUCOSE, CAPILLARY - Abnormal; Notable for the following:    Glucose-Capillary >600 (*)    All other components within normal limits  GLUCOSE, CAPILLARY - Abnormal; Notable for the following:    Glucose-Capillary 200 (*)    All other components within normal limits  MRSA PCR SCREENING  ETHANOL  SALICYLATE LEVEL  CBC  CBC  BASIC METABOLIC PANEL   ____________________________________________  EKG  None - EKG not ordered by ED physician ____________________________________________  RADIOLOGY   Dg Chest Port 1 View  Addendum Date: 10/26/2016   ADDENDUM REPORT: 10/26/2016 14:19 ADDENDUM:  There is no radiographic evidence of tuberculosis. Electronically Signed   By: Lupita Raider, M.D.   On: 10/26/2016 14:19   Result Date: 10/26/2016 CLINICAL DATA:  Possible tuberculosis. EXAM: PORTABLE CHEST 1 VIEW COMPARISON:  Radiographs of January 07, 2012. FINDINGS: The heart size and mediastinal contours are within normal limits. Both lungs are clear. Atherosclerosis of thoracic aorta is noted. The visualized skeletal structures are unremarkable. IMPRESSION: No acute cardiopulmonary abnormality seen.  Aortic atherosclerosis. Electronically Signed: By: Lupita Raider, M.D. On: 10/26/2016 13:57    ____________________________________________   PROCEDURES  Critical Care performed: Yes, see critical care procedure note(s)   Procedure(s) performed:   .Critical Care Performed by: Loleta Rose Authorized by: Loleta Rose   Critical care provider statement:    Critical care time (minutes):  30   Critical care time  was exclusive of:  Separately billable procedures and treating other patients   Critical care was necessary to treat or prevent imminent or life-threatening deterioration of the following conditions:  Metabolic crisis   Critical care was time spent personally by me on the following activities:  Development of treatment plan with patient or surrogate, discussions with consultants, evaluation of patient's response to treatment, examination of patient, obtaining history from patient or surrogate, ordering and performing treatments and interventions, ordering and review of laboratory studies, ordering and review of radiographic studies, pulse oximetry, re-evaluation of patient's condition and review of old charts       ____________________________________________   INITIAL IMPRESSION / ASSESSMENT AND PLAN / ED COURSE  Pertinent labs & imaging results that were available during my care of the patient were reviewed by me and considered in my medical decision making (see  chart for details).  the patient's blood glucose is almost 700.  Even though he has no ketones and no coma, he needs to be treated with IV insulin and fluids and monitored carefully to make sure that his blood sugar comes down to an appropriate level.  His diabetes is clearly not well controlled at this time.  Additionally he is under involuntary commitment and I will uphold that for now.  I have ordered a telepsych consult and I will admit to the hospitalist for further management of his medical issues.  The patient understands and agrees with the current plan but is still very unhappy with his living situation.  I have also ordered a social work consult.   Clinical Course as of Oct 28 502  Sat Oct 27, 2016  0207 Psych [facility] pending.  Paging hospitalist for admission.  [CF]  0239 No callback yet, repaging hospitalist  [CF]  [phone] Discussed with Dr. Tobi Bastos who will admit.  Psych SOC pending.  [CF]  0430 I received report from the psychiatrist who confirmed that the patient should stay under involuntary commitment, but there were a number of typographical errors or segments of the report that had not been deleted from a template.  My secretary called and asked them to provide an updated and corrected report.  [CF]  0502 I received the updated report from the specialist on-call who did recommend continuing involuntary commitment and confirmed that the patient does not have the capacity to make his own healthcare decisions.  They recommended Risperdal 0.5 mg by mouth daily at bedtime for paranoid ideations.  They also recommended [facility] psychiatric evaluation prior to hospital discharge for re-evaluation and reversal of IVC.  [CF]    Clinical Course User Index [CF] Loleta Rose, MD    ____________________________________________  FINAL CLINICAL IMPRESSION(S) / ED DIAGNOSES  Final diagnoses:  Hyperglycemia  Aggressive behavior  Involuntary commitment  Uncontrolled type 2 diabetes mellitus with  ketoacidosis without coma, unspecified whether long term insulin use (HCC)     MEDICATIONS GIVEN DURING THIS VISIT:  Medications  amLODipine (NORVASC) tablet 2.5 mg (not administered)  HYDROcodone-acetaminophen (NORCO) 10-325 MG per tablet 1 tablet (1 tablet Oral Given 10/27/16 0450)  insulin glargine (LANTUS) injection 30 Units (not administered)  LORazepam (ATIVAN) tablet 0.5 mg (0.5 mg Oral Given 10/27/16 0450)  losartan (COZAAR) tablet 100 mg (not administered)  enoxaparin (LOVENOX) injection 40 mg (not administered)  0.9 %  sodium chloride infusion ( Intravenous New Bag/Given 10/27/16 0450)  acetaminophen (TYLENOL) tablet 650 mg (not administered)    Or  acetaminophen (TYLENOL) suppository 650 mg (not administered)  ondansetron (ZOFRAN) tablet 4  mg (not administered)    Or  ondansetron (ZOFRAN) injection 4 mg (not administered)  senna-docusate (Senokot-S) tablet 1 tablet (not administered)  insulin aspart (novoLOG) injection 0-15 Units (not administered)  insulin aspart (novoLOG) injection 0-5 Units (not administered)  insulin aspart (novoLOG) injection 4 Units (not administered)  risperiDONE (RISPERDAL) tablet 0.5 mg (not administered)  sodium chloride 0.9 % bolus 500 mL (0 mLs Intravenous Stopped 10/27/16 0327)  insulin aspart (novoLOG) injection 10 Units (10 Units Intravenous Given 10/27/16 0147)  HYDROcodone-acetaminophen (NORCO/VICODIN) 5-325 MG per tablet 2 tablet (2 tablets Oral Given 10/27/16 0136)     NEW OUTPATIENT MEDICATIONS STARTED DURING THIS VISIT:  Current Discharge Medication List      Current Discharge Medication List      Current Discharge Medication List       Note:  This document was prepared using Dragon voice recognition software and may include unintentional dictation errors.    Loleta Rose, MD 10/27/16 1610    Loleta Rose, MD 10/27/16 [phone]

## 2016-10-27 NOTE — Progress Notes (Signed)
Patient refused to have his blood drawn for lab.  After tech left room, sitter called to report patient had pulled IV from left hand.  Upon assessment, cathter was intact.  RN cleansed area, applied pressure and taped gauze over area.  Patient verbalized "NO, NO, NO more sticking until I get some sleep."  RN informed charge nurse of removal, plan to restart within the hour if patient agreeable.

## 2016-10-27 NOTE — Care Management Obs Status (Signed)
MEDICARE OBSERVATION STATUS NOTIFICATION   Patient Details  Name: Lamoyne Hessel MRN: [id number] Date of Birth: 1928/04/11   Medicare Observation Status Notification Given:  Yes    Iran Rowe A, RN 10/27/2016, 2:57 PM

## 2016-10-27 NOTE — ED Notes (Signed)
RN began to leave patient room and patient stated: "Don't leave, I'll kill you".  Patient then smiled and stated: "I'm just kidding"

## 2016-10-27 NOTE — H&P (Addendum)
[facility] at [facility]   PATIENT NAME: Jesus Holland    MR#:  [id number]  DATE OF BIRTH:  11-06-1928  DATE OF ADMISSION:  10/26/2016  PRIMARY CARE PHYSICIAN: Marguarite Arbour, MD   REQUESTING/REFERRING PHYSICIAN:   CHIEF COMPLAINT:   Chief Complaint  Patient presents with  . Psychiatric Evaluation    HISTORY OF PRESENT ILLNESS: Jesus Holland  is a 83 y.o. male with a known history of diabetes mellitus type 2, arthritis was referred from [facility] for agitation. Patient was verbally aggressive with the staff. He was recently discharged from our hospital to [facility] house.patient reports that the DSS agent and made him ward of the state and took his dogs away.he was evaluated in the emergency room blood sugar was high more than 700 mg/dL. Patient was given insulin in the emergency room. He is involuntarily commited and one-on-one observation.patient is awake and responds to all verbal commands. He states he has been taking his medication for diabetes. Has polyuria and polyphagia.  PAST MEDICAL HISTORY:   Past Medical History:  Diagnosis Date  . Arthritis   . Diabetes mellitus without complication (HCC)     PAST SURGICAL HISTORY: Past Surgical History:  Procedure Laterality Date  . none      SOCIAL HISTORY:  Social History  Substance Use Topics  . Smoking status: Never Smoker  . Smokeless tobacco: Never Used  . Alcohol use No    FAMILY HISTORY:  Family History  Problem Relation Age of Onset  . Family history unknown: Yes    DRUG ALLERGIES:  Allergies  Allergen Reactions  . Aspirin Nausea And Vomiting  . Escitalopram Other (See Comments)  . Metformin Other (See Comments)  . Quinine Nausea And Vomiting    REVIEW OF SYSTEMS:   CONSTITUTIONAL: No fever, fatigue or weakness.  EYES: No blurred or double vision.  EARS, NOSE, AND THROAT: No tinnitus or ear pain.  RESPIRATORY: No cough, shortness of breath, wheezing or  hemoptysis.  CARDIOVASCULAR: No chest pain, orthopnea, edema.  GASTROINTESTINAL: No nausea, vomiting, diarrhea or abdominal pain.  GENITOURINARY: No dysuria, hematuria.  ENDOCRINE: Has polyuria, nocturia,  HEMATOLOGY: No anemia, easy bruising or bleeding SKIN: No rash or lesion. MUSCULOSKELETAL: No joint pain or arthritis.   NEUROLOGIC: No tingling, numbness, weakness.  PSYCHIATRY: No anxiety or depression.  Verbally aggressive  MEDICATIONS AT HOME:  Prior to Admission medications   Medication Sig Start Date End Date Taking? Authorizing Provider  amLODipine (NORVASC) 2.5 MG tablet Take 1 tablet (2.5 mg total) by mouth daily. 10/27/16  Yes Sudini, Wardell Heath, MD  HYDROcodone-acetaminophen (NORCO) 10-325 MG tablet Take 1 tablet by mouth every 6 (six) hours as needed. 10/26/16  Yes Sudini, Wardell Heath, MD  LANTUS SOLOSTAR 100 UNIT/ML Solostar Pen Inject 30 Units into the skin daily. 10/26/16  Yes Sudini, Wardell Heath, MD  LORazepam (ATIVAN) 0.5 MG tablet Take 1 tablet (0.5 mg total) by mouth every 8 (eight) hours as needed for anxiety. 10/26/16 10/26/17 Yes Sudini, Wardell Heath, MD  losartan (COZAAR) 100 MG tablet Take 1 tablet (100 mg total) by mouth daily. 10/26/16  Yes Sudini, Wardell Heath, MD      PHYSICAL EXAMINATION:   VITAL SIGNS: Blood pressure (!) 150/90, pulse 78, temperature 98.8 F (37.1 C), temperature source Oral, resp. rate 14, height  (1.778 m), weight 69.9 kg (154 lb), SpO2 99 %.  GENERAL:  83 y.o.-year-old patient lying in the bed with no acute distress.  EYES: Pupils equal, round, reactive  to light and accommodation. No scleral icterus. Extraocular muscles intact.  HEENT: Head atraumatic, normocephalic. Oropharynx dry and nasopharynx clear.  NECK:  Supple, no jugular venous distention. No thyroid enlargement, no tenderness.  LUNGS: Normal breath sounds bilaterally, no wheezing, rales,rhonchi or crepitation. No use of accessory muscles of respiration.  CARDIOVASCULAR: S1, S2 normal. No murmurs,  rubs, or gallops.  ABDOMEN: Soft, nontender, nondistended. Bowel sounds present. No organomegaly or mass.  EXTREMITIES: No pedal edema, cyanosis, or clubbing.  NEUROLOGIC: Cranial nerves II through XII are intact. Muscle strength 5/5 in all extremities. Sensation intact. Gait not checked.  PSYCHIATRIC: The patient is alert and oriented x 3.  SKIN: No obvious rash, lesion, or ulcer.   LABORATORY PANEL:   CBC  Recent Labs Lab 10/23/16 1318 10/24/16 0457 10/26/16 2350  WBC 10.9* 9.3 9.4  HGB 14.2 12.0* 14.5  HCT 40.9 34.1* 43.0  PLT 229 197 229  MCV 88.7 86.7 90.7  MCH 30.7 30.6 30.7  MCHC 34.7 35.3 33.8  RDW 13.0 13.3 13.3   ------------------------------------------------------------------------------------------------------------------  Chemistries   Recent Labs Lab 10/23/16 1318 10/24/16 0457 10/26/16 2350  NA 131* 140 131*  K 4.2 3.2* 4.9  CL 100* 110 99*  CO2 19* 23 22  GLUCOSE 566* 86 681*  BUN 21* 20 22*  CREATININE 1.30* 0.93 1.15  CALCIUM 8.7* 8.0* 8.6*  MG  --  1.6*  --   AST 19  --  22  ALT 16*  --  15*  ALKPHOS 108  --  111  BILITOT 0.7  --  0.5   ------------------------------------------------------------------------------------------------------------------ estimated creatinine clearance is 45.6 mL/min (by C-G formula based on SCr of 1.15 mg/dL). ------------------------------------------------------------------------------------------------------------------ No results for input(s): TSH, T4TOTAL, T3FREE, THYROIDAB in the last 72 hours.  Invalid input(s): FREET3   Coagulation profile No results for input(s): INR, PROTIME in the last 168 hours. ------------------------------------------------------------------------------------------------------------------- No results for input(s): DDIMER in the last 72 hours. -------------------------------------------------------------------------------------------------------------------  Cardiac  Enzymes No results for input(s): CKMB, TROPONINI, MYOGLOBIN in the last 168 hours.  Invalid input(s): CK ------------------------------------------------------------------------------------------------------------------ Invalid input(s): POCBNP  ---------------------------------------------------------------------------------------------------------------  Urinalysis    Component Value Date/Time   COLORURINE STRAW (A) 10/23/2016 2041   APPEARANCEUR CLEAR (A) 10/23/2016 2041   APPEARANCEUR Clear 05/18/2013 1739   LABSPEC 1.025 10/23/2016 2041   LABSPEC 1.004 05/18/2013 1739   PHURINE 5.0 10/23/2016 2041   GLUCOSEU >=500 (A) 10/23/2016 2041   GLUCOSEU Negative 05/18/2013 1739   HGBUR NEGATIVE 10/23/2016 2041   BILIRUBINUR NEGATIVE 10/23/2016 2041   BILIRUBINUR Negative 05/18/2013 1739   KETONESUR 5 (A) 10/23/2016 2041   PROTEINUR 30 (A) 10/23/2016 2041   NITRITE NEGATIVE 10/23/2016 2041   LEUKOCYTESUR NEGATIVE 10/23/2016 2041   LEUKOCYTESUR Negative 05/18/2013 1739     RADIOLOGY: Dg Chest Port 1 View  Addendum Date: 10/26/2016   ADDENDUM REPORT: 10/26/2016 14:19 ADDENDUM: There is no radiographic evidence of tuberculosis. Electronically Signed   By: Lupita Raider, M.D.   On: 10/26/2016 14:19   Result Date: 10/26/2016 CLINICAL DATA:  Possible tuberculosis. EXAM: PORTABLE CHEST 1 VIEW COMPARISON:  Radiographs of January 07, 2012. FINDINGS: The heart size and mediastinal contours are within normal limits. Both lungs are clear. Atherosclerosis of thoracic aorta is noted. The visualized skeletal structures are unremarkable. IMPRESSION: No acute cardiopulmonary abnormality seen.  Aortic atherosclerosis. Electronically Signed: By: Lupita Raider, M.D. On: 10/26/2016 13:57    EKG: Orders placed or performed in visit on 01/28/14  . EKG 12-Lead    IMPRESSION  AND PLAN: 83 year old elderly male patient with history of type 2 diabetes mellitus,arthritis presented to the emergency  room for agitation and verbal aggression. Admitting diagnosis 1. Uncontrolled diabetes mellitus 2. Hyponatremia 3. Dehydration 4. Psychosis Treatment plan Admit patient to medical floor Psychiatric consultation Control diabetes mellitus medical management IV fluid hydration Follow up sodium level Tele psych consult   All the records are reviewed and case discussed with ED provider. Management plans discussed with the patient, family and they are in agreement.  CODE STATUS:FULL CODE Code Status History    Date Active Date Inactive Code Status Order ID Comments User Context   10/23/2016  6:42 PM 10/26/2016  9:03 PM Full Code [id number]  Altamese Dilling, MD Inpatient       TOTAL TIME TAKING CARE OF THIS PATIENT: 50 minutes.    Ihor Austin M.D on 10/27/2016 at 3:15 AM  Between 7am to 6pm - Pager - [phone]  After 6pm go to [email or web address] - password [email or web address] [facility]  [facility]  Office  [phone]  CC: Primary care physician; Marguarite Arbour, MD

## 2016-10-27 NOTE — Care Management Note (Signed)
Case Management Note  Patient Details  Name: Jesus Holland MRN: [id number] Date of Birth: Aug 06, 1928  Subjective/Objective:   Discussed discharge planning with [facility] CSW. [name] is from [facility] and has been discharged from that facility for behavioral issues. He is currently on IVC status and has a legal guardian. CSW is following for new placement. No RNCM needs identified at this time.                 Action/Plan:   Expected Discharge Date:                  Expected Discharge Plan:     In-House Referral:     Discharge planning Services     Post Acute Care Choice:    Choice offered to:     DME Arranged:    DME Agency:     HH Arranged:    HH Agency:     Status of Service:     If discussed at [employer] of Stay Meetings, dates discussed:    Additional Comments:  [name] A, RN 10/27/2016, 2:58 PM

## 2016-10-28 LAB — GLUCOSE, CAPILLARY
GLUCOSE-CAPILLARY: 168 mg/dL — AB (ref 65–99)
Glucose-Capillary: 229 mg/dL — ABNORMAL HIGH (ref 65–99)
Glucose-Capillary: 309 mg/dL — ABNORMAL HIGH (ref 65–99)
Glucose-Capillary: 131 mg/dL — ABNORMAL HIGH (ref 65–99)

## 2016-10-28 MED ORDER — INSULIN GLARGINE 100 UNIT/ML ~~LOC~~ SOLN
34.0000 [IU] | Freq: Every day | SUBCUTANEOUS | Status: DC
  Administered 2016-10-28 – 2016-10-29 (×2): 34 [IU] via SUBCUTANEOUS
  Filled 2016-10-28 (×2): qty 0.34

## 2016-10-28 NOTE — Progress Notes (Signed)
[facility] - [place] at [facility]   PATIENT NAME: Jesus Holland    MR#:  [id number]  DATE OF BIRTH:  1928-10-24  SUBJECTIVE:  CHIEF COMPLAINT:   Chief Complaint  Patient presents with  . Psychiatric Evaluation   Patient has a sitter at bedside. IVC in place. Waiting for placement. Pleasant.  REVIEW OF SYSTEMS:    Review of Systems  Unable to perform ROS: Dementia   DRUG ALLERGIES:   Allergies  Allergen Reactions  . Aspirin Nausea And Vomiting  . Escitalopram Other (See Comments)  . Metformin Other (See Comments)  . Quinine Nausea And Vomiting    VITALS:  Blood pressure (!) 145/78, pulse 80, temperature 98.2 F (36.8 C), temperature source Oral, resp. rate 18, height  (1.727 m), weight 76.4 kg (168 lb 6.4 oz), SpO2 98 %.  PHYSICAL EXAMINATION:   Physical Exam  GENERAL:  83 y.o.-year-old patient lying in the bed with no acute distress.  EYES: Pupils equal, round, reactive to light and accommodation. No scleral icterus. Extraocular muscles intact.  HEENT: Head atraumatic, normocephalic. Oropharynx and nasopharynx clear.  NECK:  Supple, no jugular venous distention. No thyroid enlargement, no tenderness.  LUNGS: Normal breath sounds bilaterally, no wheezing, rales, rhonchi. No use of accessory muscles of respiration.  CARDIOVASCULAR: S1, S2 normal. No murmurs, rubs, or gallops.  ABDOMEN: Soft, nontender, nondistended. Bowel sounds present. No organomegaly or mass.  EXTREMITIES: No cyanosis, clubbing or edema b/l.    NEUROLOGIC: Cranial nerves II through XII are intact. No focal Motor or sensory deficits b/l.   PSYCHIATRIC: The patient is alert and oriented x 1. Pleasant  SKIN: No obvious rash, lesion, or ulcer.   LABORATORY PANEL:   CBC  Recent Labs Lab 10/26/16 2350  WBC 9.4  HGB 14.5  HCT 43.0  PLT 229   ------------------------------------------------------------------------------------------------------------------ Chemistries    Recent Labs Lab 10/24/16 0457 10/26/16 2350  NA 140 131*  K 3.2* 4.9  CL 110 99*  CO2 23 22  GLUCOSE 86 681*  BUN 20 22*  CREATININE 0.93 1.15  CALCIUM 8.0* 8.6*  MG 1.6*  --   AST  --  22  ALT  --  15*  ALKPHOS  --  111  BILITOT  --  0.5   ------------------------------------------------------------------------------------------------------------------  Cardiac Enzymes No results for input(s): TROPONINI in the last 168 hours. ------------------------------------------------------------------------------------------------------------------  RADIOLOGY:  Dg Chest Port 1 View  Addendum Date: 10/26/2016   ADDENDUM REPORT: 10/26/2016 14:19 ADDENDUM: There is no radiographic evidence of tuberculosis. Electronically Signed   By: Lupita Raider, M.D.   On: 10/26/2016 14:19   Result Date: 10/26/2016 CLINICAL DATA:  Possible tuberculosis. EXAM: PORTABLE CHEST 1 VIEW COMPARISON:  Radiographs of January 07, 2012. FINDINGS: The heart size and mediastinal contours are within normal limits. Both lungs are clear. Atherosclerosis of thoracic aorta is noted. The visualized skeletal structures are unremarkable. IMPRESSION: No acute cardiopulmonary abnormality seen.  Aortic atherosclerosis. Electronically Signed: By: Lupita Raider, M.D. On: 10/26/2016 13:57     ASSESSMENT AND PLAN:   * Uncontrolled DM with hyperglycemia On admission Due to missing insulin  SSI Restarted patient's Lantus. Will increase dose today.  * Dementia with behavioral disturbance Has legal gaurdian and now in IVC with sitter due to patient refusing to agitation Difficult situation for placement. Will need Geropsych  *DVT prophylaxis with Lovenox  All the records are reviewed and case discussed with Care Management/Social Worker Management plans discussed with the patient, family and  they are in agreement.  CODE STATUS: FULL CODE  DVT Prophylaxis: SCDs  TOTAL TIME TAKING CARE OF THIS PATIENT: 30 minutes.    POSSIBLE D/C IN 1-2 DAYS, DEPENDING ON CLINICAL CONDITION.  Milagros Loll R M.D on 10/28/2016 at 10:46 AM  Between 7am to 6pm - Pager - [phone]  After 6pm go to [email or web address] - password [email or web address] [facility]  [facility] [place] [facility]  Office  [phone]  CC: Primary care physician; Marguarite Arbour, MD  Note: This dictation was prepared with Dragon dictation along with smaller phrase technology. Any transcriptional errors that result from this process are unintentional.

## 2016-10-28 NOTE — Clinical Social Work Note (Signed)
CSW received call from [facility] that the patient is now on the waitlist for a male bed at this facility. CSW will continue to follow.  Argentina Ponder, MSW, Theresia Majors [phone]

## 2016-10-28 NOTE — Clinical Social Work Note (Signed)
CSW has sent referral to all gero-psych units in the state. CSW will update as decisions are made.  Argentina Ponder, MSW, Theresia Majors [phone]

## 2016-10-29 DIAGNOSIS — F29 Unspecified psychosis not due to a substance or known physiological condition: Secondary | ICD-10-CM

## 2016-10-29 LAB — GLUCOSE, CAPILLARY
GLUCOSE-CAPILLARY: 124 mg/dL — AB (ref 65–99)
GLUCOSE-CAPILLARY: 168 mg/dL — AB (ref 65–99)
Glucose-Capillary: 306 mg/dL — ABNORMAL HIGH (ref 65–99)
Glucose-Capillary: 322 mg/dL — ABNORMAL HIGH (ref 65–99)

## 2016-10-29 MED ORDER — INSULIN GLARGINE 100 UNIT/ML ~~LOC~~ SOLN
36.0000 [IU] | Freq: Every day | SUBCUTANEOUS | Status: DC
  Administered 2016-10-30 – 2016-11-07 (×9): 36 [IU] via SUBCUTANEOUS
  Filled 2016-10-29 (×10): qty 0.36

## 2016-10-29 MED ORDER — RISPERIDONE 0.5 MG PO TBDP
0.5000 mg | ORAL_TABLET | Freq: Two times a day (BID) | ORAL | Status: DC
  Administered 2016-10-29 – 2016-10-30 (×4): 0.5 mg via ORAL
  Filled 2016-10-29 (×4): qty 1

## 2016-10-29 MED ORDER — INSULIN ASPART 100 UNIT/ML ~~LOC~~ SOLN
6.0000 [IU] | Freq: Three times a day (TID) | SUBCUTANEOUS | Status: DC
  Administered 2016-10-29 – 2016-10-30 (×3): 6 [IU] via SUBCUTANEOUS
  Filled 2016-10-29 (×2): qty 1

## 2016-10-29 NOTE — Consult Note (Signed)
[facility] Face-to-Face Psychiatry Consult   Reason for Consult:  Consult for 83 year old man who was brought back to the hospital almost immediately after discharge because of his agitated behavior that prevented placement. Referring Physician:  Sudini Patient Identification: Jesus Holland MRN:  [id number] Principal Diagnosis: Psychosis Diagnosis:   Patient Active Problem List   Diagnosis Date Noted  . Psychosis [F29] 10/29/2016  . Hyperglycemia [R73.9] 10/23/2016  . Dementia, Alzheimer's, with behavior disturbance [G30.9, F02.81] 10/23/2016    Total Time spent with patient: 1 hour  Subjective:   Jesus Holland is a 83 y.o. male patient admitted with "get out of here! I want nothing to do with you! 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  HPI:  Patient interviewed chart reviewed. I saw this gentleman last week in the hospital as well. Last time he was here in the hospital he had presented with a very elevated blood sugar and with agitated oppositional behavior when the Department of Social Services tried to place him at the [place]. My evaluation last week was that this was a patient with dementia probably mixed vascular and Alzheimer's with psychotic symptoms and behavioral disturbance. I had taken him off commitment and suggested he be discharged and placed. Apparently this was attempted and once again the patient immediately became physically aggressive and disruptive to the point that it was impossible to place him and once again he came back to the hospital with a blood sugar of almost 700. I tried to speak with him this evening and the patient became agitated almost immediately upon seeing me. I tried to engage him in some conversation about his medical history and he just escalated more and more. Refused to listen to any reason from me or from any of the nursing aides patient appeared to be very confused at some points. He insisted that he was in a jail and that he was here to teach music to the  daughter of one of the nurses. This appeared to refer to a conversation that he and the nurse had been having about a piece of music. He was able to have brief bits of conversation that were lucid but then became emotionally very ramped up and I could not get him to calm down.  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 I have not spoken to any family and I'm not aware of any are currently involved. Patient is insisting that he will go back to his own home although it has been explained to him repeatedly that he is no longer able to make decisions for himself.  Medical history: Diabetes. Tried to get him to engage in some conversation about it but he gets so agitated I can't even tell how much he understands his illness.  Substance abuse history: Nonidentified  Past Psychiatric History: Patient does not have any known past psychiatric history that I have been able to identify although after interacting with him today and speaking with the hospitalist I am wondering more as to whether he might have an underlying bipolar disorder or other kind of mental illness since his behavior and presentation seems atypical for simply being demented.  Risk to Self: Is patient at risk for suicide?: No Risk to Others:   Prior Inpatient Therapy:   Prior Outpatient Therapy:    Past Medical History:  Past Medical History:  Diagnosis Date  .  Arthritis   . Diabetes mellitus without complication [facility])     Past Surgical History:  Procedure Laterality Date  . none     Family History:  Family History  Problem Relation Age of Onset  . Family history unknown: Yes   Family Psychiatric  History: Unknown Social History:  History  Alcohol Use No     History  Drug Use No    Social History   Social History  . Marital status: Single    Spouse name: N/A  . Number of children:  N/A  . Years of education: N/A   Occupational History  . retired    Social History Main Topics  . Smoking status: Never Smoker  . Smokeless tobacco: Never Used  . Alcohol use No  . Drug use: No  . Sexual activity: Not Asked   Other Topics Concern  . None   Social History Narrative  . None   Additional Social History:    Allergies:   Allergies  Allergen Reactions  . Aspirin Nausea And Vomiting  . Escitalopram Other (See Comments)  . Metformin Other (See Comments)  . Quinine Nausea And Vomiting    Labs:  Results for orders placed or performed during the hospital encounter of 10/26/16 (from the past 48 hour(s))  Glucose, capillary     Status: Abnormal   Collection Time: 10/28/16  7:32 AM  Result Value Ref Range   Glucose-Capillary 229 (H) 65 - 99 mg/dL  Glucose, capillary     Status: Abnormal   Collection Time: 10/28/16 11:30 AM  Result Value Ref Range   Glucose-Capillary 309 (H) 65 - 99 mg/dL  Glucose, capillary     Status: Abnormal   Collection Time: 10/28/16  4:26 PM  Result Value Ref Range   Glucose-Capillary 131 (H) 65 - 99 mg/dL  Glucose, capillary     Status: Abnormal   Collection Time: 10/28/16  8:50 PM  Result Value Ref Range   Glucose-Capillary 168 (H) 65 - 99 mg/dL   Comment 1 Notify RN   Glucose, capillary     Status: Abnormal   Collection Time: 10/29/16  7:30 AM  Result Value Ref Range   Glucose-Capillary 306 (H) 65 - 99 mg/dL   Comment 1 Notify RN   Glucose, capillary     Status: Abnormal   Collection Time: 10/29/16 11:29 AM  Result Value Ref Range   Glucose-Capillary 124 (H) 65 - 99 mg/dL   Comment 1 Notify RN   Glucose, capillary     Status: Abnormal   Collection Time: 10/29/16  4:55 PM  Result Value Ref Range   Glucose-Capillary 322 (H) 65 - 99 mg/dL   Comment 1 Notify RN   Glucose, capillary     Status: Abnormal   Collection Time: 10/29/16  8:50 PM  Result Value Ref Range   Glucose-Capillary 168 (H) 65 - 99 mg/dL    Current  Facility-Administered Medications  Medication Dose Route Frequency Provider Last Rate Last Dose  . acetaminophen (TYLENOL) tablet 650 mg  650 mg Oral Q6H PRN Ihor Austin, MD       Or  . acetaminophen (TYLENOL) suppository 650 mg  650 mg Rectal Q6H PRN Pyreddy, Vivien Rota, MD      . amLODipine (NORVASC) tablet 2.5 mg  2.5 mg Oral Daily Pyreddy, Pavan, MD   2.5 mg at 10/29/16 0956  . enoxaparin (LOVENOX) injection 40 mg  40 mg Subcutaneous Q24H Ihor Austin, MD   40 mg at 10/29/16 0955  .  fluticasone (FLONASE) 50 MCG/ACT nasal spray 2 spray  2 spray Each Nare QHS Marguarite Arbour, MD   2 spray at 10/27/16 2114  . HYDROcodone-acetaminophen (NORCO) 10-325 MG per tablet 1 tablet  1 tablet Oral Q6H PRN Ihor Austin, MD   1 tablet at 10/29/16 2100  . insulin aspart (novoLOG) injection 0-15 Units  0-15 Units Subcutaneous TID WC Ihor Austin, MD   11 Units at 10/29/16 1711  . insulin aspart (novoLOG) injection 0-5 Units  0-5 Units Subcutaneous QHS Ihor Austin, MD   2 Units at 10/27/16 2119  . insulin aspart (novoLOG) injection 6 Units  6 Units Subcutaneous TID WC Milagros Loll, MD   6 Units at 10/29/16 1711  . [START ON 10/30/2016] insulin glargine (LANTUS) injection 36 Units  36 Units Subcutaneous Daily Sudini, Wardell Heath, MD      . LORazepam (ATIVAN) tablet 0.5 mg  0.5 mg Oral Q8H PRN Ihor Austin, MD   0.5 mg at 10/27/16 2303  . losartan (COZAAR) tablet 100 mg  100 mg Oral Daily Pyreddy, Vivien Rota, MD   100 mg at 10/29/16 0956  . ondansetron (ZOFRAN) tablet 4 mg  4 mg Oral Q6H PRN Ihor Austin, MD   4 mg at 10/29/16 2048   Or  . ondansetron (ZOFRAN) injection 4 mg  4 mg Intravenous Q6H PRN Pyreddy, Vivien Rota, MD      . risperiDONE (RISPERDAL M-TABS) disintegrating tablet 0.5 mg  0.5 mg Oral BID Kimyah Frein, Jackquline Denmark, MD   0.5 mg at 10/29/16 2049  . risperiDONE (RISPERDAL) tablet 0.5 mg  0.5 mg Oral QHS Pyreddy, Vivien Rota, MD   0.5 mg at 10/28/16 2129  . senna-docusate (Senokot-S) tablet 1 tablet  1 tablet Oral  QHS PRN Ihor Austin, MD        Musculoskeletal: Strength & Muscle Tone: within normal limits Gait & Station: unsteady Patient leans: N/A  Psychiatric Specialty Exam: Physical Exam  Nursing note and vitals reviewed. Constitutional: He appears well-developed and well-nourished.  HENT:  Head: Normocephalic and atraumatic.  Eyes: Pupils are equal, round, and reactive to light. Conjunctivae are normal.  Neck: Normal range of motion.  Cardiovascular: Regular rhythm and normal heart sounds.   Respiratory: Effort normal. No respiratory distress.  GI: Soft.  Musculoskeletal: Normal range of motion.  Neurological: He is alert.  Skin: Skin is warm and dry.  Psychiatric: His affect is labile and inappropriate. His speech is rapid and/or pressured and tangential. He is agitated. Thought content is paranoid. Cognition and memory are impaired. He expresses impulsivity and inappropriate judgment. He expresses no homicidal and no suicidal ideation. He exhibits abnormal recent memory.    Review of Systems  Constitutional: Negative.   HENT: Negative.   Eyes: Negative.   Respiratory: Negative.   Cardiovascular: Negative.   Gastrointestinal: Negative.   Musculoskeletal: Negative.   Skin: Negative.   Neurological: Negative.   Psychiatric/Behavioral: Negative for depression, hallucinations, memory loss, substance abuse and suicidal ideas. The patient is not nervous/anxious and does not have insomnia.     Blood pressure 133/62, pulse 83, temperature 98.1 F (36.7 C), temperature source Oral, resp. rate 16, height  (1.727 m), weight 76.4 kg (168 lb 6.4 oz), SpO2 97 %.Body mass index is 25.61 kg/m.  General Appearance: Casual  Eye Contact:  Fair  Speech:  Pressured  Volume:  Increased  Mood:  Angry  Affect:  Congruent and Inappropriate  Thought Process:  Disorganized  Orientation:  Negative  Thought Content:  Illogical, Paranoid Ideation and Tangential  Suicidal Thoughts:  No   Homicidal Thoughts:  No  Memory:  Negative  Judgement:  Poor  Insight:  Lacking  Psychomotor Activity:  Restlessness  Concentration:  Concentration: Poor  Recall:  Poor  Fund of Knowledge:  Poor  Language:  Fair  Akathisia:  No  Handed:  Right  AIMS (if indicated):     Assets:  Communication Skills  ADL's:  Impaired  Cognition:  Impaired,  Mild  Sleep:        Treatment Plan Summary: Daily contact with patient to assess and evaluate symptoms and progress in treatment, Medication management and Plan At this point I am more convinced that the patient probably does need psychiatric treatment. His behavior is irrational and not showing any signs of improving. If anything it is getting worse. I agreed with the hospitalist that the patient probably should be started on some medicine and have started risperidone. I started with a low dose but if he is not oversedated we can try to titrate that up. Under the current circumstances I can't see how we can discontinue the IVC given the failure to place him twice in a row. I appreciate social work's attempts to refer him to geriatric psychiatry hospitals which I think is probably the best course for now. I will try to follow up as best I can.  Disposition: Recommend psychiatric Inpatient admission when medically cleared. Supportive therapy provided about ongoing stressors.  Mordecai Rasmussen, MD 10/29/2016 9:23 PM

## 2016-10-29 NOTE — Progress Notes (Signed)
RN administering PM medications as requested when MD entered room to consult on patient.  Patient's demeanor visably changed almost instantly as MD attempted to engage in conversation.  RN excused herself to retrieve requested personal hygiene supplies.  Within 10-15 mins patient became belligerent and aggressive in his tone and responses to MD and demanded that he leave his room.  Patient started speaking rapidly in Spanish and recounted previous experience and reiterated his demand that this MD not return to his room.  RN paged Sherryl Barters to discuss options with patient.

## 2016-10-29 NOTE — Clinical Social Work Note (Signed)
CSW has confirmed that [facility] has patient's referral but has no beds and CSW finalized giving the referral to [facility] today. CSW also gathered and faxed the Regional Referral Form for Admission To [facility] to [facility] and will await an authorization number. CSW spoke with DSS guardian, Marylene Land [phone] and she is aware of the above referrals. She is also aware that DSS is still primarily responsible for finding patient long term placement at this time.  [name] MSW,LCSW [phone]

## 2016-10-29 NOTE — Progress Notes (Signed)
Inpatient Diabetes Program Recommendations  AACE/ADA: New Consensus Statement on Inpatient Glycemic Control (2015)  Target Ranges:  Prepandial:   less than 140 mg/dL      Peak postprandial:   less than 180 mg/dL (1-2 hours)      Critically ill patients:  140 - 180 mg/dL   Results for Jesus Holland, Jesus Holland (MRN [id number]) as of 10/29/2016 13:17  Ref. Range 10/28/2016 07:32 10/28/2016 11:30 10/28/2016 16:26 10/28/2016 20:50 10/29/2016 07:30 10/29/2016 11:29  Glucose-Capillary Latest Ref Range: 65 - 99 mg/dL 259 (H) 563 (H) 875 (H) 168 (H) 306 (H) 124 (H)   Review of Glycemic Control  Diabetes history: DM2 Outpatient Diabetes medications: Lantus 30 units daily Current orders for Inpatient glycemic control: Lantus 34 units daily, Novolog 0-15 units TID with meals, Novolog 0-5 units QHS, Novolog 4 units TID with meals  Inpatient Diabetes Program Recommendations: Insulin - Basal: Please consider increasing Lantus 36 units daily. Insulin - Meal Coverage: Please consider increasing meal coverage to Novolog 7 units TID with meals.  Thanks, Orlando Penner, RN, MSN, CDE Diabetes Coordinator Inpatient Diabetes Program [phone] (Team Pager from 8am to 5pm)

## 2016-10-30 LAB — CBC
HEMATOCRIT: 37.4 % — AB (ref 40.0–52.0)
Hemoglobin: 13.1 g/dL (ref 13.0–18.0)
MCH: 30.8 pg (ref 26.0–34.0)
MCHC: 35 g/dL (ref 32.0–36.0)
MCV: 88 fL (ref 80.0–100.0)
Platelets: 238 10*3/uL (ref 150–440)
RBC: 4.25 MIL/uL — ABNORMAL LOW (ref 4.40–5.90)
RDW: 13.6 % (ref 11.5–14.5)
WBC: 8.6 10*3/uL (ref 3.8–10.6)

## 2016-10-30 LAB — GLUCOSE, CAPILLARY
GLUCOSE-CAPILLARY: 327 mg/dL — AB (ref 65–99)
Glucose-Capillary: 140 mg/dL — ABNORMAL HIGH (ref 65–99)
Glucose-Capillary: 358 mg/dL — ABNORMAL HIGH (ref 65–99)
Glucose-Capillary: 151 mg/dL — ABNORMAL HIGH (ref 65–99)

## 2016-10-30 LAB — BASIC METABOLIC PANEL
ANION GAP: 6 (ref 5–15)
BUN: 27 mg/dL — AB (ref 6–20)
CALCIUM: 8.6 mg/dL — AB (ref 8.9–10.3)
CO2: 27 mmol/L (ref 22–32)
CREATININE: 0.98 mg/dL (ref 0.61–1.24)
Chloride: 107 mmol/L (ref 101–111)
GFR calc Af Amer: >60 mL/min (ref >60)
GFR calc non Af Amer: >60 mL/min (ref >60)
GLUCOSE: 186 mg/dL — AB (ref 65–99)
Potassium: 4.5 mmol/L (ref 3.5–5.1)
Sodium: 140 mmol/L (ref 135–145)

## 2016-10-30 MED ORDER — INSULIN ASPART 100 UNIT/ML ~~LOC~~ SOLN
8.0000 [IU] | Freq: Three times a day (TID) | SUBCUTANEOUS | Status: DC
  Administered 2016-10-30 – 2016-11-05 (×17): 8 [IU] via SUBCUTANEOUS
  Filled 2016-10-30 (×17): qty 1

## 2016-10-30 MED ORDER — RISPERIDONE 1 MG PO TBDP
1.0000 mg | ORAL_TABLET | Freq: Two times a day (BID) | ORAL | Status: DC
  Administered 2016-10-31: 1 mg via ORAL
  Filled 2016-10-30 (×2): qty 1

## 2016-10-30 NOTE — Clinical Social Work Note (Signed)
CSW contacted [facility] this afternoon and was informed that patient is on their waitlist. [name] MSW,LCSW [phone]

## 2016-10-30 NOTE — Progress Notes (Signed)
[facility] - [place] at [facility]   PATIENT NAME: Jesus Holland    MR#:  [id number]  DATE OF BIRTH:  12-21-1928  SUBJECTIVE:  CHIEF COMPLAINT:   Chief Complaint  Patient presents with  . Psychiatric Evaluation   Patient has a sitter at bedside. IVC in place. Waiting for placement Says he is tired  Wants to go to [place]  REVIEW OF SYSTEMS:    Review of Systems  Unable to perform ROS: Dementia   DRUG ALLERGIES:   Allergies  Allergen Reactions  . Aspirin Nausea And Vomiting  . Escitalopram Other (See Comments)  . Metformin Other (See Comments)  . Quinine Nausea And Vomiting    VITALS:  Blood pressure (!) 144/66, pulse 80, temperature 97.7 F (36.5 C), temperature source Oral, resp. rate 16, height  (1.727 m), weight 76.4 kg (168 lb 6.4 oz), SpO2 100 %.  PHYSICAL EXAMINATION:   Physical Exam  GENERAL:  83 y.o.-year-old patient lying in the bed with no acute distress.  EYES: Pupils equal, round, reactive to light and accommodation. No scleral icterus. Extraocular muscles intact.  HEENT: Head atraumatic, normocephalic. Oropharynx and nasopharynx clear.  NECK:  Supple, no jugular venous distention. No thyroid enlargement, no tenderness.  LUNGS: Normal breath sounds bilaterally, no wheezing, rales, rhonchi. No use of accessory muscles of respiration.  CARDIOVASCULAR: S1, S2 normal. No murmurs, rubs, or gallops.  ABDOMEN: Soft, nontender, nondistended. Bowel sounds present. No organomegaly or mass.  EXTREMITIES: No cyanosis, clubbing or edema b/l.    NEUROLOGIC: Cranial nerves II through XII are intact. No focal Motor or sensory deficits b/l.   PSYCHIATRIC: The patient is alert and oriented x 1. Pleasant  SKIN: No obvious rash, lesion, or ulcer.   LABORATORY PANEL:   CBC  Recent Labs Lab 10/30/16 0941  WBC 8.6  HGB 13.1  HCT 37.4*  PLT 238    ------------------------------------------------------------------------------------------------------------------ Chemistries   Recent Labs Lab 10/24/16 0457 10/26/16 2350 10/30/16 0941  NA 140 131* 140  K 3.2* 4.9 4.5  CL 110 99* 107  CO2 GLUCOSE 86 681* 186*  BUN 20 22* 27*  CREATININE 0.93 1.15 0.98  CALCIUM 8.0* 8.6* 8.6*  MG 1.6*  --   --   AST  --  22  --   ALT  --  15*  --   ALKPHOS  --  111  --   BILITOT  --  0.5  --    ------------------------------------------------------------------------------------------------------------------  Cardiac Enzymes No results for input(s): TROPONINI in the last 168 hours. ------------------------------------------------------------------------------------------------------------------  RADIOLOGY:  No results found.   ASSESSMENT AND PLAN:   * Uncontrolled DM with hyperglycemia On admission Due to missing insulin  SSI On lantus and pre meal insulin  * Dementia with behavioral disturbance Has legal gaurdian and now in IVC with sitter due to patient refusing to agitation Difficult situation for placement. Will need Geropsych Started on risperidone Appreciate psychiatry input  * HTN ON lisinopril and norvasc  *DVT prophylaxis with Lovenox  All the records are reviewed and case discussed with Care Management/Social Worker Management plans discussed with the patient, family and they are in agreement.  CODE STATUS: FULL CODE  DVT Prophylaxis: SCDs  TOTAL TIME TAKING CARE OF THIS PATIENT: 30 minutes.   POSSIBLE D/C IN 1-2 DAYS, DEPENDING ON CLINICAL CONDITION.  Milagros Loll R M.D on 10/30/2016 at 1:31 PM  Between 7am to 6pm - Pager - [phone]  After 6pm go to [email or web address] -  password [email or web address] [facility]  [facility] [place] [facility]  Office  [phone]  CC: Primary care physician; Marguarite Arbour, MD  Note: This dictation was prepared with Dragon dictation along with smaller phrase technology. Any  transcriptional errors that result from this process are unintentional.

## 2016-10-30 NOTE — Clinical Social Work Note (Signed)
CSW has spoken to [facility] and faxed them additional labs and the psych consult from last evening. Will continue to follow regarding disposition. [name] MSW,LCSW [phone]

## 2016-10-30 NOTE — Progress Notes (Signed)
[facility] [place] [facility]   PATIENT NAME: Jesus Holland    MR#:  [id number]  DATE OF BIRTH:  Oct 10, 1928  SUBJECTIVE:  CHIEF COMPLAINT:   Chief Complaint  Patient presents with  . Psychiatric Evaluation   Patient seen and examined on 10/29/2016  Patient has a sitter at bedside. IVC in place. Waiting for placement. Pleasant.  REVIEW OF SYSTEMS:    Review of Systems  Unable to perform ROS: Dementia   DRUG ALLERGIES:   Allergies  Allergen Reactions  . Aspirin Nausea And Vomiting  . Escitalopram Other (See Comments)  . Metformin Other (See Comments)  . Quinine Nausea And Vomiting    VITALS:  Blood pressure (!) 144/66, pulse 80, temperature 97.7 F (36.5 C), temperature source Oral, resp. rate 16, height  (1.727 m), weight 76.4 kg (168 lb 6.4 oz), SpO2 100 %.  PHYSICAL EXAMINATION:   Physical Exam  GENERAL:  83 y.o.-year-old patient lying in the bed with no acute distress.  EYES: Pupils equal, round, reactive to light and accommodation. No scleral icterus. Extraocular muscles intact.  HEENT: Head atraumatic, normocephalic. Oropharynx and nasopharynx clear.  NECK:  Supple, no jugular venous distention. No thyroid enlargement, no tenderness.  LUNGS: Normal breath sounds bilaterally, no wheezing, rales, rhonchi. No use of accessory muscles of respiration.  CARDIOVASCULAR: S1, S2 normal. No murmurs, rubs, or gallops.  ABDOMEN: Soft, nontender, nondistended. Bowel sounds present. No organomegaly or mass.  EXTREMITIES: No cyanosis, clubbing or edema b/l.    NEUROLOGIC: Cranial nerves II through XII are intact. No focal Motor or sensory deficits b/l.   PSYCHIATRIC: The patient is alert and oriented x 1. Pleasant  SKIN: No obvious rash, lesion, or ulcer.   LABORATORY PANEL:   CBC  Recent Labs Lab 10/30/16 0941  WBC 8.6  HGB 13.1  HCT 37.4*  PLT 238    ------------------------------------------------------------------------------------------------------------------ Chemistries   Recent Labs Lab 10/24/16 0457 10/26/16 2350 10/30/16 0941  NA 140 131* 140  K 3.2* 4.9 4.5  CL 110 99* 107  CO2 GLUCOSE 86 681* 186*  BUN 20 22* 27*  CREATININE 0.93 1.15 0.98  CALCIUM 8.0* 8.6* 8.6*  MG 1.6*  --   --   AST  --  22  --   ALT  --  15*  --   ALKPHOS  --  111  --   BILITOT  --  0.5  --    ------------------------------------------------------------------------------------------------------------------  Cardiac Enzymes No results for input(s): TROPONINI in the last 168 hours. ------------------------------------------------------------------------------------------------------------------  RADIOLOGY:  No results found.   ASSESSMENT AND PLAN:   * Uncontrolled DM with hyperglycemia On admission Due to missing insulin  [facility] Restarted patient's Lantus. Increased dose .  * Dementia with behavioral disturbance Has legal gaurdian and now in IVC with sitter due to patient refusing to agitation Difficult situation for placement. Will need Geropsych Discussed with Dr. Toni Amend and he will see patient Start risperidone  *DVT prophylaxis with Lovenox  All the records are reviewed and case discussed with Care Management/Social Worker Management plans discussed with the patient, family and they are in agreement.  CODE STATUS: FULL CODE  DVT Prophylaxis: SCDs  TOTAL TIME TAKING CARE OF THIS PATIENT: 30 minutes.   POSSIBLE D/C IN 1-2 DAYS, DEPENDING ON CLINICAL CONDITION.  Milagros Loll R M.D on 10/30/2016 at 1:29 PM  Between 7am to 6pm - Pager - [phone]  After 6pm go to [email or web address] - password [email or web address]  [facility]  Office  [phone]  CC: Primary care physician; Marguarite Arbour, MD  Note: This dictation was prepared with Dragon dictation along with smaller phrase technology. Any  transcriptional errors that result from this process are unintentional.

## 2016-10-30 NOTE — Consult Note (Signed)
[facility] Consult   Reason for Consult:  Consult for 83 year old man who was brought back to the hospital almost immediately after discharge because of his agitated behavior that prevented placement. Referring Physician:  Sudini Patient Identification: Jesus Holland MRN:  [id number] Principal Diagnosis: Psychosis Diagnosis:   Patient Active Problem List   Diagnosis Date Noted  . Psychosis [F29] 10/29/2016  . Hyperglycemia [R73.9] 10/23/2016  . Dementia, Alzheimer's, with behavior disturbance [G30.9, F02.81] 10/23/2016    Total Time spent with patient: 20 minutes  Subjective:   Jesus Holland is a 83 y.o. male patient admitted with "get out of here! I want nothing to do with you! 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  Patient interviewed chart reviewed. Follow-up note for this 83 year old man with some degree of dementia but a lot of oppositionality. Once again when I came to talk to him today he immediately began insulting me and telling me to leave. I tried to explain to him my role in assisting with his treatment plan but he had no interest in engaging in any conversation with me. Continues to have poor insight no ability to make reasonable decisions and inappropriate mood state.  HPI:  Patient interviewed chart reviewed. I saw this gentleman last week in the hospital as well. Last time he was here in the hospital he had presented with a very elevated blood sugar and with agitated oppositional behavior when the Department of Social Services tried to place him at the [place]. My evaluation last week was that this was a patient with dementia probably mixed vascular and Alzheimer's with psychotic symptoms and behavioral disturbance. I had taken him off commitment and suggested he be discharged and placed. Apparently this was attempted and once again the patient immediately became physically aggressive and disruptive to the point that it was impossible to place him and once again he  came back to the hospital with a blood sugar of almost 700. I tried to speak with him this evening and the patient became agitated almost immediately upon seeing me. I tried to engage him in some conversation about his medical history and he just escalated more and more. Refused to listen to any reason from me or from any of the nursing aides patient appeared to be very confused at some points. 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 This appeared to refer to a conversation that he and the nurse had been having about a piece of music. He was able to have brief bits of conversation that were lucid but then became emotionally very ramped up and I could not get him to calm down.  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 I have not spoken to any family and I'm not aware of any are currently involved. Patient is insisting that he will go back to his own home although it has been explained to him repeatedly that he is no longer able to make decisions for himself.  Medical history: Diabetes. Tried to get him to engage in some conversation about it but he gets so agitated I can't even tell how much he understands his illness.  Substance abuse history: Nonidentified  Past Psychiatric History: Patient does not have any known past psychiatric history that I have been able to  identify although after interacting with him today and speaking with the hospitalist I am wondering more as to whether he might have an underlying bipolar disorder or other kind of mental illness since his behavior and presentation seems atypical for simply being demented.  Risk to Self: Is patient at risk for suicide?: No Risk to Others:   Prior Inpatient Therapy:   Prior Outpatient Therapy:    Past Medical History:  Past Medical History:   Diagnosis Date  . Arthritis   . Diabetes mellitus without complication [facility])     Past Surgical History:  Procedure Laterality Date  . none     Family History:  Family History  Problem Relation Age of Onset  . Family history unknown: Yes   Family Psychiatric  History: Unknown Social History:  History  Alcohol Use No     History  Drug Use No    Social History   Social History  . Marital status: Single    Spouse name: N/A  . Number of children: N/A  . Years of education: N/A   Occupational History  . retired    Social History Main Topics  . Smoking status: Never Smoker  . Smokeless tobacco: Never Used  . Alcohol use No  . Drug use: No  . Sexual activity: Not Asked   Other Topics Concern  . None   Social History Narrative  . None   Additional Social History:    Allergies:   Allergies  Allergen Reactions  . Aspirin Nausea And Vomiting  . Escitalopram Other (See Comments)  . Metformin Other (See Comments)  . Quinine Nausea And Vomiting    Labs:  Results for orders placed or performed during the hospital encounter of 10/26/16 (from the past 48 hour(s))  Glucose, capillary     Status: Abnormal   Collection Time: 10/28/16  8:50 PM  Result Value Ref Range   Glucose-Capillary 168 (H) 65 - 99 mg/dL   Comment 1 Notify RN   Glucose, capillary     Status: Abnormal   Collection Time: 10/29/16  7:30 AM  Result Value Ref Range   Glucose-Capillary 306 (H) 65 - 99 mg/dL   Comment 1 Notify RN   Glucose, capillary     Status: Abnormal   Collection Time: 10/29/16 11:29 AM  Result Value Ref Range   Glucose-Capillary 124 (H) 65 - 99 mg/dL   Comment 1 Notify RN   Glucose, capillary     Status: Abnormal   Collection Time: 10/29/16  4:55 PM  Result Value Ref Range   Glucose-Capillary 322 (H) 65 - 99 mg/dL   Comment 1 Notify RN   Glucose, capillary     Status: Abnormal   Collection Time: 10/29/16  8:50 PM  Result Value Ref Range   Glucose-Capillary 168 (H) 65 -  99 mg/dL  Glucose, capillary     Status: Abnormal   Collection Time: 10/30/16  7:53 AM  Result Value Ref Range   Glucose-Capillary 140 (H) 65 - 99 mg/dL  CBC     Status: Abnormal   Collection Time: 10/30/16  9:41 AM  Result Value Ref Range   WBC 8.6 3.8 - 10.6 K/uL   RBC 4.25 (L) 4.40 - 5.90 MIL/uL   Hemoglobin 13.1 13.0 - 18.0 g/dL   HCT 37.4 (L) 40.0 - 52.0 %   MCV 88.0 80.0 - 100.0 fL   MCH 30.8 26.0 - 34.0 pg   MCHC 35.0 32.0 - 36.0 g/dL   RDW 13.6 11.5 -  14.5 %   Platelets 238 150 - 440 K/uL  Basic metabolic panel     Status: Abnormal   Collection Time: 10/30/16  9:41 AM  Result Value Ref Range   Sodium 140 135 - 145 mmol/L   Potassium 4.5 3.5 - 5.1 mmol/L   Chloride 107 101 - 111 mmol/L   CO2 27 22 - 32 mmol/L   Glucose, Bld 186 (H) 65 - 99 mg/dL   BUN 27 (H) 6 - 20 mg/dL   Creatinine, Ser 0.98 0.61 - 1.24 mg/dL   Calcium 8.6 (L) 8.9 - 10.3 mg/dL   GFR calc non Af Amer >60 >60 mL/min   GFR calc Af Amer >60 >60 mL/min    Comment: (NOTE) The eGFR has been calculated using the CKD EPI equation. This calculation has not been validated in all clinical situations. eGFR's persistently <60 mL/min signify possible Chronic Kidney Disease.    Anion gap 6 5 - 15  Glucose, capillary     Status: Abnormal   Collection Time: 10/30/16 11:58 AM  Result Value Ref Range   Glucose-Capillary 358 (H) 65 - 99 mg/dL  Glucose, capillary     Status: Abnormal   Collection Time: 10/30/16  4:39 PM  Result Value Ref Range   Glucose-Capillary 151 (H) 65 - 99 mg/dL    Current Facility-Administered Medications  Medication Dose Route Frequency Provider Last Rate Last Dose  . acetaminophen (TYLENOL) tablet 650 mg  650 mg Oral Q6H PRN Saundra Shelling, MD       Or  . acetaminophen (TYLENOL) suppository 650 mg  650 mg Rectal Q6H PRN Pyreddy, Reatha Harps, MD      . amLODipine (NORVASC) tablet 2.5 mg  2.5 mg Oral Daily Pyreddy, Pavan, MD   2.5 mg at 10/30/16 0904  . enoxaparin (LOVENOX) injection 40 mg   40 mg Subcutaneous Q24H Saundra Shelling, MD   40 mg at 10/30/16 0904  . fluticasone (FLONASE) 50 MCG/ACT nasal spray 2 spray  2 spray Each Nare QHS Idelle Crouch, MD   2 spray at 10/27/16 2114  . HYDROcodone-acetaminophen (NORCO) 10-325 MG per tablet 1 tablet  1 tablet Oral Q6H PRN Saundra Shelling, MD   1 tablet at 10/30/16 1727  . insulin aspart (novoLOG) injection 0-15 Units  0-15 Units Subcutaneous TID WC Saundra Shelling, MD   3 Units at 10/30/16 1729  . insulin aspart (novoLOG) injection 0-5 Units  0-5 Units Subcutaneous QHS Saundra Shelling, MD   2 Units at 10/27/16 2119  . insulin aspart (novoLOG) injection 8 Units  8 Units Subcutaneous TID WC Hillary Bow, MD   8 Units at 10/30/16 1730  . insulin glargine (LANTUS) injection 36 Units  36 Units Subcutaneous Daily Hillary Bow, MD   36 Units at 10/30/16 0905  . LORazepam (ATIVAN) tablet 0.5 mg  0.5 mg Oral Q8H PRN Saundra Shelling, MD   0.5 mg at 10/30/16 1214  . losartan (COZAAR) tablet 100 mg  100 mg Oral Daily Pyreddy, Reatha Harps, MD   100 mg at 10/30/16 0904  . ondansetron (ZOFRAN) tablet 4 mg  4 mg Oral Q6H PRN Saundra Shelling, MD   4 mg at 10/29/16 2048   Or  . ondansetron (ZOFRAN) injection 4 mg  4 mg Intravenous Q6H PRN Saundra Shelling, MD      . Derrill Memo ON 10/31/2016] risperiDONE (RISPERDAL M-TABS) disintegrating tablet 1 mg  1 mg Oral BID Clapacs, John T, MD      . senna-docusate (Senokot-S) tablet 1 tablet  1 tablet Oral QHS PRN Saundra Shelling, MD        Musculoskeletal: Strength & Muscle Tone: within normal limits Gait & Station: unsteady Patient leans: N/A  Psychiatric Specialty Exam: Physical Exam  Nursing note and vitals reviewed. Constitutional: He appears well-developed and well-nourished.  HENT:  Head: Normocephalic and atraumatic.  Eyes: Pupils are equal, round, and reactive to light. Conjunctivae are normal.  Neck: Normal range of motion.  Cardiovascular: Regular rhythm and normal heart sounds.   Respiratory: Effort  normal. No respiratory distress.  GI: Soft.  Musculoskeletal: Normal range of motion.  Neurological: He is alert.  Skin: Skin is warm and dry.  Psychiatric: His affect is labile and inappropriate. His speech is rapid and/or pressured and tangential. He is agitated. Thought content is paranoid. Cognition and memory are impaired. He expresses impulsivity and inappropriate judgment. He expresses no homicidal and no suicidal ideation. He exhibits abnormal recent memory.    Review of Systems  Constitutional: Negative.   HENT: Negative.   Eyes: Negative.   Respiratory: Negative.   Cardiovascular: Negative.   Gastrointestinal: Negative.   Musculoskeletal: Negative.   Skin: Negative.   Neurological: Negative.   Psychiatric/Behavioral: Negative for depression, hallucinations, memory loss, substance abuse and suicidal ideas. The patient is not nervous/anxious and does not have insomnia.    [facility] Consult   Reason for Consult:  Consult for 83 year old man who was brought back to the hospital almost immediately after discharge because of his agitated behavior that prevented placement. Referring Physician:  Sudini Patient Identification: Jonahtan Manseau MRN:  [id number] Principal Diagnosis: Psychosis Diagnosis:   Patient Active Problem List   Diagnosis Date Noted  . Psychosis [F29] 10/29/2016  . Hyperglycemia [R73.9] 10/23/2016  . Dementia, Alzheimer's, with behavior disturbance [G30.9, F02.81] 10/23/2016    Total Time spent with patient: 1 hour  Subjective:   Grabiel Schmutz is a 83 y.o. male patient admitted with "get out of here! I want nothing to do with you! 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  HPI:  Patient interviewed chart reviewed. I saw this gentleman last week in the hospital as well. Last time he was here in the hospital he had presented with a very elevated blood sugar and with agitated oppositional behavior when the Department of Social Services tried to  place him at the [facility]. My evaluation last week was that this was a patient with dementia probably mixed vascular and Alzheimer's with psychotic symptoms and behavioral disturbance. I had taken him off commitment and suggested he be discharged and placed. Apparently this was attempted and once again the patient immediately became physically aggressive and disruptive to the point that it was impossible to place him and once again he came back to the hospital with a blood sugar of almost 700. I tried to speak with him this evening and the patient became agitated almost immediately upon seeing me. I tried to engage him in some conversation about his medical history and he just escalated more and more. Refused to listen to any reason from me or from any of the nursing aides patient appeared to be very confused at some points. 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 This appeared to refer to a conversation that he and the nurse had been having about a piece of music. He was able to have brief bits of conversation that were lucid but then became  emotionally very ramped up and I could not get him to calm down.  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 I have not spoken to any family and I'm not aware of any are currently involved. Patient is insisting that he will go back to his own home although it has been explained to him repeatedly that he is no longer able to make decisions for himself.  Medical history: Diabetes. Tried to get him to engage in some conversation about it but he gets so agitated I can't even tell how much he understands his illness.  Substance abuse history: Nonidentified  Past Psychiatric History: Patient does not have any known past psychiatric history that I have been able to identify although  after interacting with him today and speaking with the hospitalist I am wondering more as to whether he might have an underlying bipolar disorder or other kind of mental illness since his behavior and presentation seems atypical for simply being demented.  Risk to Self: Is patient at risk for suicide?: No Risk to Others:   Prior Inpatient Therapy:   Prior Outpatient Therapy:    Past Medical History:  Past Medical History:  Diagnosis Date  . Arthritis   . Diabetes mellitus without complication [facility])     Past Surgical History:  Procedure Laterality Date  . none     Family History:  Family History  Problem Relation Age of Onset  . Family history unknown: Yes   Family Psychiatric  History: Unknown Social History:  History  Alcohol Use No     History  Drug Use No    Social History   Social History  . Marital status: Single    Spouse name: N/A  . Number of children: N/A  . Years of education: N/A   Occupational History  . retired    Social History Main Topics  . Smoking status: Never Smoker  . Smokeless tobacco: Never Used  . Alcohol use No  . Drug use: No  . Sexual activity: Not Asked   Other Topics Concern  . None   Social History Narrative  . None   Additional Social History:    Allergies:   Allergies  Allergen Reactions  . Aspirin Nausea And Vomiting  . Escitalopram Other (See Comments)  . Metformin Other (See Comments)  . Quinine Nausea And Vomiting    Labs:  Results for orders placed or performed during the hospital encounter of 10/26/16 (from the past 48 hour(s))  Glucose, capillary     Status: Abnormal   Collection Time: 10/28/16  8:50 PM  Result Value Ref Range   Glucose-Capillary 168 (H) 65 - 99 mg/dL   Comment 1 Notify RN   Glucose, capillary     Status: Abnormal   Collection Time: 10/29/16  7:30 AM  Result Value Ref Range   Glucose-Capillary 306 (H) 65 - 99 mg/dL   Comment 1 Notify RN   Glucose, capillary     Status: Abnormal    Collection Time: 10/29/16 11:29 AM  Result Value Ref Range   Glucose-Capillary 124 (H) 65 - 99 mg/dL   Comment 1 Notify RN   Glucose, capillary     Status: Abnormal   Collection Time: 10/29/16  4:55 PM  Result Value Ref Range   Glucose-Capillary 322 (H) 65 - 99 mg/dL   Comment 1 Notify RN  Glucose, capillary     Status: Abnormal   Collection Time: 10/29/16  8:50 PM  Result Value Ref Range   Glucose-Capillary 168 (H) 65 - 99 mg/dL  Glucose, capillary     Status: Abnormal   Collection Time: 10/30/16  7:53 AM  Result Value Ref Range   Glucose-Capillary 140 (H) 65 - 99 mg/dL  CBC     Status: Abnormal   Collection Time: 10/30/16  9:41 AM  Result Value Ref Range   WBC 8.6 3.8 - 10.6 K/uL   RBC 4.25 (L) 4.40 - 5.90 MIL/uL   Hemoglobin 13.1 13.0 - 18.0 g/dL   HCT 37.4 (L) 40.0 - 52.0 %   MCV 88.0 80.0 - 100.0 fL   MCH 30.8 26.0 - 34.0 pg   MCHC 35.0 32.0 - 36.0 g/dL   RDW 13.6 11.5 - 14.5 %   Platelets 238 150 - 440 K/uL  Basic metabolic panel     Status: Abnormal   Collection Time: 10/30/16  9:41 AM  Result Value Ref Range   Sodium 140 135 - 145 mmol/L   Potassium 4.5 3.5 - 5.1 mmol/L   Chloride 107 101 - 111 mmol/L   CO2 27 22 - 32 mmol/L   Glucose, Bld 186 (H) 65 - 99 mg/dL   BUN 27 (H) 6 - 20 mg/dL   Creatinine, Ser 0.98 0.61 - 1.24 mg/dL   Calcium 8.6 (L) 8.9 - 10.3 mg/dL   GFR calc non Af Amer >60 >60 mL/min   GFR calc Af Amer >60 >60 mL/min    Comment: (NOTE) The eGFR has been calculated using the CKD EPI equation. This calculation has not been validated in all clinical situations. eGFR's persistently <60 mL/min signify possible Chronic Kidney Disease.    Anion gap 6 5 - 15  Glucose, capillary     Status: Abnormal   Collection Time: 10/30/16 11:58 AM  Result Value Ref Range   Glucose-Capillary 358 (H) 65 - 99 mg/dL  Glucose, capillary     Status: Abnormal   Collection Time: 10/30/16  4:39 PM  Result Value Ref Range   Glucose-Capillary 151 (H) 65 - 99 mg/dL     Current Facility-Administered Medications  Medication Dose Route Frequency Provider Last Rate Last Dose  . acetaminophen (TYLENOL) tablet 650 mg  650 mg Oral Q6H PRN Saundra Shelling, MD       Or  . acetaminophen (TYLENOL) suppository 650 mg  650 mg Rectal Q6H PRN Pyreddy, Reatha Harps, MD      . amLODipine (NORVASC) tablet 2.5 mg  2.5 mg Oral Daily Pyreddy, Pavan, MD   2.5 mg at 10/30/16 0904  . enoxaparin (LOVENOX) injection 40 mg  40 mg Subcutaneous Q24H Saundra Shelling, MD   40 mg at 10/30/16 0904  . fluticasone (FLONASE) 50 MCG/ACT nasal spray 2 spray  2 spray Each Nare QHS Idelle Crouch, MD   2 spray at 10/27/16 2114  . HYDROcodone-acetaminophen (NORCO) 10-325 MG per tablet 1 tablet  1 tablet Oral Q6H PRN Saundra Shelling, MD   1 tablet at 10/30/16 1727  . insulin aspart (novoLOG) injection 0-15 Units  0-15 Units Subcutaneous TID WC Saundra Shelling, MD   3 Units at 10/30/16 1729  . insulin aspart (novoLOG) injection 0-5 Units  0-5 Units Subcutaneous QHS Saundra Shelling, MD   2 Units at 10/27/16 2119  . insulin aspart (novoLOG) injection 8 Units  8 Units Subcutaneous TID WC Hillary Bow, MD   8 Units at 10/30/16 1730  .  insulin glargine (LANTUS) injection 36 Units  36 Units Subcutaneous Daily Hillary Bow, MD   36 Units at 10/30/16 0905  . LORazepam (ATIVAN) tablet 0.5 mg  0.5 mg Oral Q8H PRN Saundra Shelling, MD   0.5 mg at 10/30/16 1214  . losartan (COZAAR) tablet 100 mg  100 mg Oral Daily Pyreddy, Reatha Harps, MD   100 mg at 10/30/16 0904  . ondansetron (ZOFRAN) tablet 4 mg  4 mg Oral Q6H PRN Saundra Shelling, MD   4 mg at 10/29/16 2048   Or  . ondansetron (ZOFRAN) injection 4 mg  4 mg Intravenous Q6H PRN Saundra Shelling, MD      . Derrill Memo ON 10/31/2016] risperiDONE (RISPERDAL M-TABS) disintegrating tablet 1 mg  1 mg Oral BID Clapacs, Madie Reno, MD      . senna-docusate (Senokot-S) tablet 1 tablet  1 tablet Oral QHS PRN Saundra Shelling, MD        Musculoskeletal: Strength & Muscle Tone: within normal  limits Gait & Station: unsteady Patient leans: N/A  Psychiatric Specialty Exam: Physical Exam  Nursing note and vitals reviewed. Constitutional: He appears well-developed and well-nourished.  HENT:  Head: Normocephalic and atraumatic.  Eyes: Pupils are equal, round, and reactive to light. Conjunctivae are normal.  Neck: Normal range of motion.  Cardiovascular: Regular rhythm and normal heart sounds.   Respiratory: Effort normal. No respiratory distress.  GI: Soft.  Musculoskeletal: Normal range of motion.  Neurological: He is alert.  Skin: Skin is warm and dry.  Psychiatric: His affect is labile and inappropriate. His speech is rapid and/or pressured and tangential. He is agitated. Thought content is paranoid. Cognition and memory are impaired. He expresses impulsivity and inappropriate judgment. He expresses no homicidal and no suicidal ideation. He exhibits abnormal recent memory.    Review of Systems  Constitutional: Negative.   HENT: Negative.   Eyes: Negative.   Respiratory: Negative.   Cardiovascular: Negative.   Gastrointestinal: Negative.   Musculoskeletal: Negative.   Skin: Negative.   Neurological: Negative.   Psychiatric/Behavioral: Negative for depression, hallucinations, memory loss, substance abuse and suicidal ideas. The patient is not nervous/anxious and does not have insomnia.     Blood pressure (!) 161/85, pulse 98, temperature 98.2 F (36.8 C), temperature source Oral, resp. rate 20, height 5' 8"  (1.727 m), weight 76.4 kg (168 lb 6.4 oz), SpO2 97 %.Body mass index is 25.61 kg/m.  General Appearance: Casual  Eye Contact:  Fair  Speech:  Pressured  Volume:  Increased  Mood:  Angry  Affect:  Congruent and Inappropriate  Thought Process:  Disorganized  Orientation:  Negative  Thought Content:  Illogical, Paranoid Ideation and Tangential  Suicidal Thoughts:  No  Homicidal Thoughts:  No  Memory:  Negative  Judgement:  Poor  Insight:  Lacking  Psychomotor  Activity:  Restlessness  Concentration:  Concentration: Poor  Recall:  Poor  Fund of Knowledge:  Poor  Language:  Fair  Akathisia:  No  Handed:  Right  AIMS (if indicated):     Assets:  Communication Skills  ADL's:  Impaired  Cognition:  Impaired,  Mild  Sleep:        Treatment Plan Summary: Daily contact with patient to assess and evaluate symptoms and progress in treatment, Medication management and Plan At this point I am more convinced that the patient probably does need psychiatric treatment. His behavior is irrational and not showing any signs of improving. If anything it is getting worse. I agreed with the hospitalist that the  patient probably should be started on some medicine and have started risperidone. I started with a low dose but if he is not oversedated we can try to titrate that up. Under the current circumstances I can't see how we can discontinue the IVC given the failure to place him twice in a row. I appreciate social work's attempts to refer him to [facility] which I think is probably the best course for now. I will try to follow up as best I can.  Disposition: Recommend psychiatric Inpatient admission when medically cleared. Supportive therapy provided about ongoing stressors.  Alethia Berthold, MD 10/30/2016 8:16 PM  Blood pressure (!) 161/85, pulse 98, temperature 98.2 F (36.8 C), temperature source Oral, resp. rate 20, height 5' 8"  (1.727 m), weight 76.4 kg (168 lb 6.4 oz), SpO2 97 %.Body mass index is 25.61 kg/m.  General Appearance: Casual  Eye Contact:  Fair  Speech:  Pressured  Volume:  Increased  Mood:  Angry  Affect:  Congruent and Inappropriate  Thought Process:  Disorganized  Orientation:  Negative  Thought Content:  Illogical, Paranoid Ideation and Tangential  Suicidal Thoughts:  No  Homicidal Thoughts:  No  Memory:  Negative  Judgement:  Poor  Insight:  Lacking  Psychomotor Activity:  Restlessness  Concentration:   Concentration: Poor  Recall:  Poor  Fund of Knowledge:  Poor  Language:  Fair  Akathisia:  No  Handed:  Right  AIMS (if indicated):     Assets:  Communication Skills  ADL's:  Impaired  Cognition:  Impaired,  Mild  Sleep:        Treatment Plan Summary: Daily contact with patient to assess and evaluate symptoms and progress in treatment, Medication management and Plan Chart reviewed. I appreciate the difficulty in finding him an appropriate placement but at this point I think he is showing even more signs of mental health problems. I increased today the dose of risperidone to 1 mg twice a day to see if we can help to improve his thought process. I will continue to follow up as best I can.  Disposition: Recommend psychiatric Inpatient admission when medically cleared. Supportive therapy provided about ongoing stressors.  Alethia Berthold, MD 10/30/2016 8:16 PM

## 2016-10-31 LAB — GLUCOSE, CAPILLARY
GLUCOSE-CAPILLARY: 179 mg/dL — AB (ref 65–99)
GLUCOSE-CAPILLARY: 170 mg/dL — AB (ref 65–99)
Glucose-Capillary: 244 mg/dL — ABNORMAL HIGH (ref 65–99)
Glucose-Capillary: 148 mg/dL — ABNORMAL HIGH (ref 65–99)

## 2016-10-31 MED ORDER — RISPERIDONE 0.5 MG PO TBDP
1.0000 mg | ORAL_TABLET | Freq: Three times a day (TID) | ORAL | Status: DC
  Administered 2016-10-31 – 2016-11-15 (×39): 1 mg via ORAL
  Filled 2016-10-31 (×20): qty 2
  Filled 2016-10-31: qty 1
  Filled 2016-10-31 (×25): qty 2

## 2016-10-31 NOTE — Consult Note (Signed)
[facility]   Reason for Consult:  Consult for 83 year old man who was brought back to the hospital almost immediately after discharge because of his agitated behavior that prevented placement. Referring Physician:  Sudini Patient Identification: Jesus Holland MRN:  [id number] Principal Diagnosis: Psychosis Diagnosis:   Patient Active Problem List   Diagnosis Date Noted  . Psychosis [F29] 10/29/2016  . Hyperglycemia [R73.9] 10/23/2016  . Dementia, Alzheimer's, with behavior disturbance [G30.9, F02.81] 10/23/2016    Total Time spent with patient: 20 minutes  Subjective:   Jesus Holland is a 83 y.o. male patient admitted with "get out of here! I want nothing to do with you! 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  Follow-up note for this 83 year old man who is in the hospital because of his agitated refusal to cooperate with placement. I spent a fair bit of time with this gentleman this afternoon. Unlike yesterday he was actually willing to talk with me today and tolerated my presence. I went slowly with a lot of validation to his complaints trying to explain to him the reality of the circumstances that he is facing. I wanted to make it clear to him that at this point he really does not have an option of his old home to return to and that his best choice would be to cooperate with the Department of Social Services in placement so that he could move on with his life. Despite the fact that he could engage in some degree of reasonable conversation he was never able to fully grasp this. Continues to insist that no one has any right to place him in a horrible place like the ones that he saw her recently "by which apparently he [place]". Patient is very fixated on wanting to complain about his dogs. It has been explained to him multiple times that his dogs have been placed in appropriate homes. Patient is not able to really process this and continues to perseverate on  his concern for them. He has been started on a modest dose of antipsychotic and appears to be tolerating that well without oversedation or obvious stiffness.Marland Kitchen  HPI:  Patient interviewed chart reviewed. I saw this gentleman last week in the hospital as well. Last time he was here in the hospital he had presented with a very elevated blood sugar and with agitated oppositional behavior when the Department of Social Services tried to place him at the [facility]. My evaluation last week was that this was a patient with dementia probably mixed vascular and Alzheimer's with psychotic symptoms and behavioral disturbance. I had taken him off commitment and suggested he be discharged and placed. Apparently this was attempted and once again the patient immediately became physically aggressive and disruptive to the point that it was impossible to place him and once again he came back to the hospital with a blood sugar of almost 700. I tried to speak with him this evening and the patient became agitated almost immediately upon seeing me. I tried to engage him in some conversation about his medical history and he just escalated more and more. Refused to listen to any reason from me or from any of the nursing aides patient appeared to be very confused at some points. 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 This appeared to refer to a conversation that he and the nurse had been having about a piece  of music. He was able to have brief bits of conversation that were lucid but then became emotionally very ramped up and I could not get him to calm down.  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 I have not spoken to any family and I'm not aware of any are currently involved. Patient is insisting that he will go back to his own home  although it has been explained to him repeatedly that he is no longer able to make decisions for himself.  Medical history: Diabetes. Tried to get him to engage in some conversation about it but he gets so agitated I can't even tell how much he understands his illness.  Substance abuse history: Nonidentified  Past Psychiatric History: Patient does not have any known past psychiatric history that I have been able to identify although after interacting with him today and speaking with the hospitalist I am wondering more as to whether he might have an underlying bipolar disorder or other kind of mental illness since his behavior and presentation seems atypical for simply being demented.  Risk to Self: Is patient at risk for suicide?: No Risk to Others:   Prior Inpatient Therapy:   Prior Outpatient Therapy:    Past Medical History:  Past Medical History:  Diagnosis Date  . Arthritis   . Diabetes mellitus without complication [facility])     Past Surgical History:  Procedure Laterality Date  . none     Family History:  Family History  Problem Relation Age of Onset  . Family history unknown: Yes   Family Psychiatric  History: Unknown Social History:  History  Alcohol Use No     History  Drug Use No    Social History   Social History  . Marital status: Single    Spouse name: N/A  . Number of children: N/A  . Years of education: N/A   Occupational History  . retired    Social History Main Topics  . Smoking status: Never Smoker  . Smokeless tobacco: Never Used  . Alcohol use No  . Drug use: No  . Sexual activity: Not Asked   Other Topics Concern  . None   Social History Narrative  . None   Additional Social History:    Allergies:   Allergies  Allergen Reactions  . Aspirin Nausea And Vomiting  . Escitalopram Other (See Comments)  . Metformin Other (See Comments)  . Quinine Nausea And Vomiting    Labs:  Results for orders placed or performed during the hospital  encounter of 10/26/16 (from the past 48 hour(s))  Glucose, capillary     Status: Abnormal   Collection Time: 10/29/16  8:50 PM  Result Value Ref Range   Glucose-Capillary 168 (H) 65 - 99 mg/dL  Glucose, capillary     Status: Abnormal   Collection Time: 10/30/16  7:53 AM  Result Value Ref Range   Glucose-Capillary 140 (H) 65 - 99 mg/dL  CBC     Status: Abnormal   Collection Time: 10/30/16  9:41 AM  Result Value Ref Range   WBC 8.6 3.8 - 10.6 K/uL   RBC 4.25 (L) 4.40 - 5.90 MIL/uL   Hemoglobin 13.1 13.0 - 18.0 g/dL   HCT 37.4 (L) 40.0 - 52.0 %   MCV 88.0 80.0 - 100.0 fL   MCH 30.8 26.0 - 34.0 pg  MCHC 35.0 32.0 - 36.0 g/dL   RDW 13.6 11.5 - 14.5 %   Platelets 238 150 - 440 K/uL  Basic metabolic panel     Status: Abnormal   Collection Time: 10/30/16  9:41 AM  Result Value Ref Range   Sodium 140 135 - 145 mmol/L   Potassium 4.5 3.5 - 5.1 mmol/L   Chloride 107 101 - 111 mmol/L   CO2 27 22 - 32 mmol/L   Glucose, Bld 186 (H) 65 - 99 mg/dL   BUN 27 (H) 6 - 20 mg/dL   Creatinine, Ser 0.98 0.61 - 1.24 mg/dL   Calcium 8.6 (L) 8.9 - 10.3 mg/dL   GFR calc non Af Amer >60 >60 mL/min   GFR calc Af Amer >60 >60 mL/min    Comment: (NOTE) The eGFR has been calculated using the CKD EPI equation. This calculation has not been validated in all clinical situations. eGFR's persistently <60 mL/min signify possible Chronic Kidney Disease.    Anion gap 6 5 - 15  Glucose, capillary     Status: Abnormal   Collection Time: 10/30/16 11:58 AM  Result Value Ref Range   Glucose-Capillary 358 (H) 65 - 99 mg/dL  Glucose, capillary     Status: Abnormal   Collection Time: 10/30/16  4:39 PM  Result Value Ref Range   Glucose-Capillary 151 (H) 65 - 99 mg/dL  Glucose, capillary     Status: Abnormal   Collection Time: 10/30/16  9:36 PM  Result Value Ref Range   Glucose-Capillary 327 (H) 65 - 99 mg/dL  Glucose, capillary     Status: Abnormal   Collection Time: 10/31/16  7:49 AM  Result Value Ref Range    Glucose-Capillary 244 (H) 65 - 99 mg/dL   Comment 1 Notify RN   Glucose, capillary     Status: Abnormal   Collection Time: 10/31/16 11:39 AM  Result Value Ref Range   Glucose-Capillary 179 (H) 65 - 99 mg/dL   Comment 1 Notify RN   Glucose, capillary     Status: Abnormal   Collection Time: 10/31/16  4:34 PM  Result Value Ref Range   Glucose-Capillary 148 (H) 65 - 99 mg/dL   Comment 1 Notify RN     Current Facility-Administered Medications  Medication Dose Route Frequency Provider Last Rate Last Dose  . acetaminophen (TYLENOL) tablet 650 mg  650 mg Oral Q6H PRN Saundra Shelling, MD       Or  . acetaminophen (TYLENOL) suppository 650 mg  650 mg Rectal Q6H PRN Pyreddy, Reatha Harps, MD      . amLODipine (NORVASC) tablet 2.5 mg  2.5 mg Oral Daily Pyreddy, Pavan, MD   2.5 mg at 10/31/16 0902  . enoxaparin (LOVENOX) injection 40 mg  40 mg Subcutaneous Q24H Saundra Shelling, MD   40 mg at 10/31/16 0903  . fluticasone (FLONASE) 50 MCG/ACT nasal spray 2 spray  2 spray Each Nare QHS Idelle Crouch, MD   2 spray at 10/30/16 2327  . HYDROcodone-acetaminophen (NORCO) 10-325 MG per tablet 1 tablet  1 tablet Oral Q6H PRN Saundra Shelling, MD   1 tablet at 10/31/16 1348  . insulin aspart (novoLOG) injection 0-15 Units  0-15 Units Subcutaneous TID WC Saundra Shelling, MD   3 Units at 10/31/16 1154  . insulin aspart (novoLOG) injection 0-5 Units  0-5 Units Subcutaneous QHS Saundra Shelling, MD   4 Units at 10/30/16 2328  . insulin aspart (novoLOG) injection 8 Units  8 Units Subcutaneous TID WC Sudini,  Srikar, MD   8 Units at 10/31/16 1402  . insulin glargine (LANTUS) injection 36 Units  36 Units Subcutaneous Daily Hillary Bow, MD   36 Units at 10/31/16 [phone]  . LORazepam (ATIVAN) tablet 0.5 mg  0.5 mg Oral Q8H PRN Saundra Shelling, MD   0.5 mg at 10/30/16 2327  . losartan (COZAAR) tablet 100 mg  100 mg Oral Daily Pyreddy, Reatha Harps, MD   100 mg at 10/31/16 0903  . ondansetron (ZOFRAN) tablet 4 mg  4 mg Oral Q6H PRN  Saundra Shelling, MD   4 mg at 10/29/16 2048   Or  . ondansetron (ZOFRAN) injection 4 mg  4 mg Intravenous Q6H PRN Pyreddy, Reatha Harps, MD      . risperiDONE (RISPERDAL M-TABS) disintegrating tablet 1 mg  1 mg Oral BID Remmy Riffe, Madie Reno, MD   1 mg at 10/31/16 0902  . senna-docusate (Senokot-S) tablet 1 tablet  1 tablet Oral QHS PRN Saundra Shelling, MD        Musculoskeletal: Strength & Muscle Tone: within normal limits Gait & Station: unsteady Patient leans: N/A  Psychiatric Specialty Exam: Physical Exam  Nursing note and vitals reviewed. Constitutional: He appears well-developed and well-nourished.  HENT:  Head: Normocephalic and atraumatic.  Eyes: Pupils are equal, round, and reactive to light. Conjunctivae are normal.  Neck: Normal range of motion.  Cardiovascular: Regular rhythm and normal heart sounds.   Respiratory: Effort normal. No respiratory distress.  GI: Soft.  Musculoskeletal: Normal range of motion.  Neurological: He is alert.  Skin: Skin is warm and dry.  Psychiatric: His affect is labile and inappropriate. His speech is rapid and/or pressured and tangential. He is agitated. Thought content is paranoid. Cognition and memory are impaired. He expresses impulsivity and inappropriate judgment. He expresses no homicidal and no suicidal ideation. He exhibits abnormal recent memory.    Review of Systems  Constitutional: Negative.   HENT: Negative.   Eyes: Negative.   Respiratory: Negative.   Cardiovascular: Negative.   Gastrointestinal: Negative.   Musculoskeletal: Negative.   Skin: Negative.   Neurological: Negative.   Psychiatric/Behavioral: Negative for depression, hallucinations, memory loss, substance abuse and suicidal ideas. The patient is not nervous/anxious and does not have insomnia.    [facility]   Reason for Consult:  Consult for 83 year old man who was brought back to the hospital almost immediately after discharge because of his agitated  behavior that prevented placement. Referring Physician:  Sudini Patient Identification: Jesus Holland MRN:  [id number] Principal Diagnosis: Psychosis Diagnosis:   Patient Active Problem List   Diagnosis Date Noted  . Psychosis [F29] 10/29/2016  . Hyperglycemia [R73.9] 10/23/2016  . Dementia, Alzheimer's, with behavior disturbance [G30.9, F02.81] 10/23/2016    Total Time spent with patient: 1 hour  Subjective:   Jesus Holland is a 83 y.o. male patient admitted with "get out of here! I want nothing to do with you! 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  HPI:  Patient interviewed chart reviewed. I saw this gentleman last week in the hospital as well. Last time he was here in the hospital he had presented with a very elevated blood sugar and with agitated oppositional behavior when the Department of Social Services tried to place him at the [facility]. My evaluation last week was that this was a patient with dementia probably mixed vascular and Alzheimer's with psychotic symptoms and behavioral disturbance. I had taken him off commitment and suggested he be discharged and placed. Apparently this was attempted and  once again the patient immediately became physically aggressive and disruptive to the point that it was impossible to place him and once again he came back to the hospital with a blood sugar of almost 700. I tried to speak with him this evening and the patient became agitated almost immediately upon seeing me. I tried to engage him in some conversation about his medical history and he just escalated more and more. Refused to listen to any reason from me or from any of the nursing aides patient appeared to be very confused at some points. 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 This appeared to refer to a conversation that he and the nurse had been having about a piece of music. He was able to have brief bits of conversation that were  lucid but then became emotionally very ramped up and I could not get him to calm down.  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 I have not spoken to any family and I'm not aware of any are currently involved. Patient is insisting that he will go back to his own home although it has been explained to him repeatedly that he is no longer able to make decisions for himself.  Medical history: Diabetes. Tried to get him to engage in some conversation about it but he gets so agitated I can't even tell how much he understands his illness.  Substance abuse history: Nonidentified  Past Psychiatric History: Patient does not have any known past psychiatric history that I have been able to identify although after interacting with him today and speaking with the hospitalist I am wondering more as to whether he might have an underlying bipolar disorder or other kind of mental illness since his behavior and presentation seems atypical for simply being demented.  Risk to Self: Is patient at risk for suicide?: No Risk to Others:   Prior Inpatient Therapy:   Prior Outpatient Therapy:    Past Medical History:  Past Medical History:  Diagnosis Date  . Arthritis   . Diabetes mellitus without complication [facility])     Past Surgical History:  Procedure Laterality Date  . none     Family History:  Family History  Problem Relation Age of Onset  . Family history unknown: Yes   Family Psychiatric  History: Unknown Social History:  History  Alcohol Use No     History  Drug Use No    Social History   Social History  . Marital status: Single    Spouse name: N/A  . Number of children: N/A  . Years of education: N/A   Occupational History  . retired    Social History Main Topics  . Smoking status: Never Smoker  . Smokeless tobacco: Never Used  . Alcohol use No  .  Drug use: No  . Sexual activity: Not Asked   Other Topics Concern  . None   Social History Narrative  . None   Additional Social History:    Allergies:   Allergies  Allergen Reactions  . Aspirin Nausea And Vomiting  . Escitalopram Other (See Comments)  . Metformin Other (See Comments)  . Quinine Nausea And Vomiting    Labs:  Results for orders placed or performed during the hospital encounter of 10/26/16 (from  the past 48 hour(s))  Glucose, capillary     Status: Abnormal   Collection Time: 10/29/16  8:50 PM  Result Value Ref Range   Glucose-Capillary 168 (H) 65 - 99 mg/dL  Glucose, capillary     Status: Abnormal   Collection Time: 10/30/16  7:53 AM  Result Value Ref Range   Glucose-Capillary 140 (H) 65 - 99 mg/dL  CBC     Status: Abnormal   Collection Time: 10/30/16  9:41 AM  Result Value Ref Range   WBC 8.6 3.8 - 10.6 K/uL   RBC 4.25 (L) 4.40 - 5.90 MIL/uL   Hemoglobin 13.1 13.0 - 18.0 g/dL   HCT 37.4 (L) 40.0 - 52.0 %   MCV 88.0 80.0 - 100.0 fL   MCH 30.8 26.0 - 34.0 pg   MCHC 35.0 32.0 - 36.0 g/dL   RDW 13.6 11.5 - 14.5 %   Platelets 238 150 - 440 K/uL  Basic metabolic panel     Status: Abnormal   Collection Time: 10/30/16  9:41 AM  Result Value Ref Range   Sodium 140 135 - 145 mmol/L   Potassium 4.5 3.5 - 5.1 mmol/L   Chloride 107 101 - 111 mmol/L   CO2 27 22 - 32 mmol/L   Glucose, Bld 186 (H) 65 - 99 mg/dL   BUN 27 (H) 6 - 20 mg/dL   Creatinine, Ser 0.98 0.61 - 1.24 mg/dL   Calcium 8.6 (L) 8.9 - 10.3 mg/dL   GFR calc non Af Amer >60 >60 mL/min   GFR calc Af Amer >60 >60 mL/min    Comment: (NOTE) The eGFR has been calculated using the CKD EPI equation. This calculation has not been validated in all clinical situations. eGFR's persistently <60 mL/min signify possible Chronic Kidney Disease.    Anion gap 6 5 - 15  Glucose, capillary     Status: Abnormal   Collection Time: 10/30/16 11:58 AM  Result Value Ref Range   Glucose-Capillary 358 (H) 65 - 99  mg/dL  Glucose, capillary     Status: Abnormal   Collection Time: 10/30/16  4:39 PM  Result Value Ref Range   Glucose-Capillary 151 (H) 65 - 99 mg/dL  Glucose, capillary     Status: Abnormal   Collection Time: 10/30/16  9:36 PM  Result Value Ref Range   Glucose-Capillary 327 (H) 65 - 99 mg/dL  Glucose, capillary     Status: Abnormal   Collection Time: 10/31/16  7:49 AM  Result Value Ref Range   Glucose-Capillary 244 (H) 65 - 99 mg/dL   Comment 1 Notify RN   Glucose, capillary     Status: Abnormal   Collection Time: 10/31/16 11:39 AM  Result Value Ref Range   Glucose-Capillary 179 (H) 65 - 99 mg/dL   Comment 1 Notify RN   Glucose, capillary     Status: Abnormal   Collection Time: 10/31/16  4:34 PM  Result Value Ref Range   Glucose-Capillary 148 (H) 65 - 99 mg/dL   Comment 1 Notify RN     Current Facility-Administered Medications  Medication Dose Route Frequency Provider Last Rate Last Dose  . acetaminophen (TYLENOL) tablet 650 mg  650 mg Oral Q6H PRN Saundra Shelling, MD       Or  . acetaminophen (TYLENOL) suppository 650 mg  650 mg Rectal Q6H PRN Pyreddy, Pavan, MD      . amLODipine (NORVASC) tablet 2.5 mg  2.5 mg Oral Daily Pyreddy, Pavan, MD   2.5 mg at  10/31/16 0902  . enoxaparin (LOVENOX) injection 40 mg  40 mg Subcutaneous Q24H Saundra Shelling, MD   40 mg at 10/31/16 0903  . fluticasone (FLONASE) 50 MCG/ACT nasal spray 2 spray  2 spray Each Nare QHS Idelle Crouch, MD   2 spray at 10/30/16 2327  . HYDROcodone-acetaminophen (NORCO) 10-325 MG per tablet 1 tablet  1 tablet Oral Q6H PRN Saundra Shelling, MD   1 tablet at 10/31/16 1348  . insulin aspart (novoLOG) injection 0-15 Units  0-15 Units Subcutaneous TID WC Saundra Shelling, MD   3 Units at 10/31/16 1154  . insulin aspart (novoLOG) injection 0-5 Units  0-5 Units Subcutaneous QHS Saundra Shelling, MD   4 Units at 10/30/16 2328  . insulin aspart (novoLOG) injection 8 Units  8 Units Subcutaneous TID WC Hillary Bow, MD   8 Units  at 10/31/16 1402  . insulin glargine (LANTUS) injection 36 Units  36 Units Subcutaneous Daily Hillary Bow, MD   36 Units at 10/31/16 [phone]  . LORazepam (ATIVAN) tablet 0.5 mg  0.5 mg Oral Q8H PRN Saundra Shelling, MD   0.5 mg at 10/30/16 2327  . losartan (COZAAR) tablet 100 mg  100 mg Oral Daily Pyreddy, Reatha Harps, MD   100 mg at 10/31/16 0903  . ondansetron (ZOFRAN) tablet 4 mg  4 mg Oral Q6H PRN Saundra Shelling, MD   4 mg at 10/29/16 2048   Or  . ondansetron (ZOFRAN) injection 4 mg  4 mg Intravenous Q6H PRN Pyreddy, Reatha Harps, MD      . risperiDONE (RISPERDAL M-TABS) disintegrating tablet 1 mg  1 mg Oral BID Dennys Guin, Madie Reno, MD   1 mg at 10/31/16 0902  . senna-docusate (Senokot-S) tablet 1 tablet  1 tablet Oral QHS PRN Saundra Shelling, MD        Musculoskeletal: Strength & Muscle Tone: within normal limits Gait & Station: unsteady Patient leans: N/A  Psychiatric Specialty Exam: Physical Exam  Nursing note and vitals reviewed. Constitutional: He appears well-developed and well-nourished.  HENT:  Head: Normocephalic and atraumatic.  Eyes: Pupils are equal, round, and reactive to light. Conjunctivae are normal.  Neck: Normal range of motion.  Cardiovascular: Regular rhythm and normal heart sounds.   Respiratory: Effort normal. No respiratory distress.  GI: Soft.  Musculoskeletal: Normal range of motion.  Neurological: He is alert.  Skin: Skin is warm and dry.  Psychiatric: His affect is labile and inappropriate. His speech is rapid and/or pressured and tangential. He is agitated. Thought content is paranoid. Cognition and memory are impaired. He expresses impulsivity and inappropriate judgment. He expresses no homicidal and no suicidal ideation. He exhibits abnormal recent memory.    Review of Systems  Constitutional: Negative.   HENT: Negative.   Eyes: Negative.   Respiratory: Negative.   Cardiovascular: Negative.   Gastrointestinal: Negative.   Musculoskeletal: Negative.   Skin:  Negative.   Neurological: Negative.   Psychiatric/Behavioral: Negative for depression, hallucinations, memory loss, substance abuse and suicidal ideas. The patient is not nervous/anxious and does not have insomnia.     Blood pressure 132/72, pulse 89, temperature 97.8 F (36.6 C), temperature source Oral, resp. rate (!) 21, height _0  (1.727 m), weight 76.4 kg (168 lb 6.4 oz), SpO2 98 %.Body mass index is 25.61 kg/m.  General Appearance: Casual  Eye Contact:  Fair  Speech:  Pressured  Volume:  Increased  Mood:  Angry  Affect:  Congruent and Inappropriate  Thought Process:  Disorganized  Orientation:  Negative  Thought Content:  Illogical, Paranoid Ideation and Tangential  Suicidal Thoughts:  No  Homicidal Thoughts:  No  Memory:  Negative  Judgement:  Poor  Insight:  Lacking  Psychomotor Activity:  Restlessness  Concentration:  Concentration: Poor  Recall:  Poor  Fund of Knowledge:  Poor  Language:  Fair  Akathisia:  No  Handed:  Right  AIMS (if indicated):     Assets:  Communication Skills  ADL's:  Impaired  Cognition:  Impaired,  Mild  Sleep:        Treatment Plan Summary: Daily contact with patient to assess and evaluate symptoms and progress in treatment, Medication management and Plan At this point I am more convinced that the patient probably does need psychiatric treatment. His behavior is irrational and not showing any signs of improving. If anything it is getting worse. I agreed with the hospitalist that the patient probably should be started on some medicine and have started risperidone. I started with a low dose but if he is not oversedated we can try to titrate that up. Under the current circumstances I can't see how we can discontinue the IVC given the failure to place him twice in a row. I appreciate social work's attempts to refer him to [facility] which I think is probably the best course for now. I will try to follow up as best I  can.  Disposition: Recommend psychiatric Inpatient admission when medically cleared. Supportive therapy provided about ongoing stressors.  Alethia Berthold, MD 10/31/2016 4:57 PM  Blood pressure 132/72, pulse 89, temperature 97.8 F (36.6 C), temperature source Oral, resp. rate (!) 21, height _0  (1.727 m), weight 76.4 kg (168 lb 6.4 oz), SpO2 98 %.Body mass index is 25.61 kg/m.  General Appearance: Casual  Eye Contact:  Fair  Speech:  Pressured  Volume:  Increased  Mood:  Angry  Affect:  Congruent and Inappropriate  Thought Process:  Disorganized  Orientation:  Negative  Thought Content:  Illogical, Paranoid Ideation and Tangential  Suicidal Thoughts:  No  Homicidal Thoughts:  No  Memory:  Negative  Judgement:  Poor  Insight:  Lacking  Psychomotor Activity:  Restlessness  Concentration:  Concentration: Poor  Recall:  Poor  Fund of Knowledge:  Poor  Language:  Fair  Akathisia:  No  Handed:  Right  AIMS (if indicated):     Assets:  Communication Skills  ADL's:  Impaired  Cognition:  Impaired,  Mild  Sleep:        Treatment Plan Summary: Daily contact with patient to assess and evaluate symptoms and progress in treatment, Medication management and Plan As of Wednesday the 19th this unfortunate gentleman continues to refuse to accept the reality of guardianship. It is clear that if we were to release him from the hospital even if he were to accompany DSS on leaving that as soon as they tried to place him in an assisted living he would once again rebel and become uncooperative and wind up back in the emergency room just continuing this cycle. Case reviewed with social work. Placement would be useful. Meantime I will try to press ahead with some of the antipsychotic medicine to see if his tractability improves.  Disposition: Recommend psychiatric Inpatient admission when medically cleared. Supportive therapy provided about ongoing stressors.  Alethia Berthold, MD 10/31/2016 4:57 PM

## 2016-10-31 NOTE — Progress Notes (Signed)
[facility] - [place] [facility]   PATIENT NAME: Jesus Holland    MR#:  [id number]  DATE OF BIRTH:  January 06, 1929  SUBJECTIVE:  CHIEF COMPLAINT:   Chief Complaint  Patient presents with  . Psychiatric Evaluation   Patient has a sitter at bedside. IVC in place. Waiting for placement Says he is not happy with Psych dr and would like to see someone else as he thinks I'm crazy - "I want to go home" REVIEW OF SYSTEMS:    Review of Systems  Constitutional: Negative for chills, fever and weight loss.  HENT: Negative for nosebleeds and sore throat.   Eyes: Negative for blurred vision.  Respiratory: Negative for cough, shortness of breath and wheezing.   Cardiovascular: Negative for chest pain, orthopnea, leg swelling and PND.  Gastrointestinal: Negative for abdominal pain, constipation, diarrhea, heartburn, nausea and vomiting.  Genitourinary: Negative for dysuria and urgency.  Musculoskeletal: Negative for back pain.  Skin: Negative for rash.  Neurological: Negative for dizziness, speech change, focal weakness and headaches.  Endo/Heme/Allergies: Does not bruise/bleed easily.  Psychiatric/Behavioral: Negative for depression.   DRUG ALLERGIES:   Allergies  Allergen Reactions  . Aspirin Nausea And Vomiting  . Escitalopram Other (See Comments)  . Metformin Other (See Comments)  . Quinine Nausea And Vomiting    VITALS:  Blood pressure 127/62, pulse 73, temperature 98.4 F (36.9 C), resp. rate (!) 21, height  (1.727 m), weight 76.4 kg (168 lb 6.4 oz), SpO2 99 %.  PHYSICAL EXAMINATION:  Physical Exam  Constitutional: He is oriented to person, place, and time and well-developed, well-nourished, and in no distress.  HENT:  Head: Normocephalic and atraumatic.  Eyes: Pupils are equal, round, and reactive to light. Conjunctivae and EOM are normal.  Neck: Normal range of motion. Neck supple. No tracheal deviation present. No thyromegaly present.   Cardiovascular: Normal rate, regular rhythm and normal heart sounds.   Pulmonary/Chest: Effort normal and breath sounds normal. No respiratory distress. He has no wheezes. He exhibits no tenderness.  Abdominal: Soft. Bowel sounds are normal. He exhibits no distension. There is no tenderness.  Musculoskeletal: Normal range of motion.  Neurological: He is alert and oriented to person, place, and time. No cranial nerve deficit.  Skin: Skin is warm and dry. No rash noted.  Psychiatric: Mood normal. His affect is inappropriate. He expresses impulsivity. He is concerned with wish fulfillment.   LABORATORY PANEL:   CBC  Recent Labs Lab 10/30/16 0941  WBC 8.6  HGB 13.1  HCT 37.4*  PLT 238   ------------------------------------------------------------------------------------------------------------------ Chemistries   Recent Labs Lab 10/26/16 2350 10/30/16 0941  NA 131* 140  K 4.9 4.5  CL 99* 107  CO2 22 27  GLUCOSE 681* 186*  BUN 22* 27*  CREATININE 1.15 0.98  CALCIUM 8.6* 8.6*  AST 22  --   ALT 15*  --   ALKPHOS 111  --   BILITOT 0.5  --    ------------------------------------------------------------------------------------------------------------------  Cardiac Enzymes No results for input(s): TROPONINI in the last 168 hours. ------------------------------------------------------------------------------------------------------------------  RADIOLOGY:  No results found.   ASSESSMENT AND PLAN:   * Uncontrolled DM with hyperglycemia: present on admission Due to missing insulin, now much better controlled - continue SSI On lantus and pre meal insulin  * Dementia with behavioral disturbance Has legal gaurdian and now in IVC with sitter due to patient refusing to agitation Difficult situation for placement. - [facility] - SW aware and working on  it. - Will need Geropsych - continue risperidone & ativan prn - Appreciate psychiatry  input  * HTN - continue losartan and norvasc  *DVT prophylaxis with Lovenox   All the records are reviewed and case discussed with Care Management/Social Worker Management plans discussed with the patient, nursing and they are in agreement.  CODE STATUS: FULL CODE  DVT Prophylaxis: SCDs  TOTAL TIME TAKING CARE OF THIS PATIENT: 20 minutes.   POSSIBLE D/C IN 1-2 DAYS, DEPENDING ON CLINICAL CONDITION. [facility]  Delfino Lovett M.D on 10/31/2016 at 7:09 AM  Between 7am to 6pm - Pager - [phone]  After 6pm go to [email or web address] - password [email or web address] [facility]  [facility]  Office  [phone]  CC: Primary care physician; Marguarite Arbour, MD  Note: This dictation was prepared with Dragon dictation along with smaller phrase technology. Any transcriptional errors that result from this process are unintentional.

## 2016-11-01 DIAGNOSIS — F29 Unspecified psychosis not due to a substance or known physiological condition: Secondary | ICD-10-CM

## 2016-11-01 LAB — GLUCOSE, CAPILLARY
GLUCOSE-CAPILLARY: 149 mg/dL — AB (ref 65–99)
GLUCOSE-CAPILLARY: 128 mg/dL — AB (ref 65–99)
Glucose-Capillary: 139 mg/dL — ABNORMAL HIGH (ref 65–99)
Glucose-Capillary: 237 mg/dL — ABNORMAL HIGH (ref 65–99)

## 2016-11-01 NOTE — Clinical Social Work Note (Signed)
Patient up at nursing station on and off all day, not following redirection regarding returning to his room and has been confused. He has also been asking for someone to take him to the [facility] as well. In addition, the sitter has reported that patient has been verbally abusive throughout the day.   [name] MSW,LCSW [phone]

## 2016-11-01 NOTE — Consult Note (Signed)
[facility] Face-to-Face Psychiatry Consult   Reason for Consult:  Consult for 83 year old man who was brought back to the hospital almost immediately after discharge because of his agitated behavior that prevented placement. Referring Physician:  Sudini Patient Identification: [facility] MRN:  [id number] Principal Diagnosis: Psychosis Diagnosis:   Patient Active Problem List   Diagnosis Date Noted  . Psychosis [F29] 10/29/2016  . Hyperglycemia [R73.9] 10/23/2016  . Dementia, Alzheimer's, with behavior disturbance [G30.9, F02.81] 10/23/2016    Total Time spent with patient: 20 minutes  Subjective:   [facility] is a 83 y.o. male patient admitted with "get out of here! I want nothing to do with you! 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  This is a follow-up for this patient with dementia and some degree of psychosis. When I saw him this evening he was drowsy and when he did wake up he was actually almost polite to me. Nevertheless review of the chart shows he has been abusive agitated and confused much of the day. Seems to be tolerating the Risperdal and partially having some improvement to it. Still very fixated on his dogs and unwilling to even consider talking about a discharge plan without getting agitated.  HPI:  Patient interviewed chart reviewed. I saw this gentleman last week in the hospital as well. Last time he was here in the hospital he had presented with a very elevated blood sugar and with agitated oppositional behavior when the Department of Social Services tried to place him at the [facility]. My evaluation last week was that this was a patient with dementia probably mixed vascular and Alzheimer's with psychotic symptoms and behavioral disturbance. I had taken him off commitment and suggested he be discharged and placed. Apparently this was attempted and once again the patient immediately became physically aggressive and disruptive to the point that it was impossible to place him and once  again he came back to the hospital with a blood sugar of almost 700. I tried to speak with him this evening and the patient became agitated almost immediately upon seeing me. I tried to engage him in some conversation about his medical history and he just escalated more and more. Refused to listen to any reason from me or from any of the nursing aides patient appeared to be very confused at some points. 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 This appeared to refer to a conversation that he and the nurse had been having about a piece of music. He was able to have brief bits of conversation that were lucid but then became emotionally very ramped up and I could not get him to calm down.  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 I have not spoken to any family and I'm not aware of any are currently involved. Patient is insisting that he will go back to his own home although it has been explained to him repeatedly that he is no longer able to make decisions for himself.  Medical history: Diabetes. Tried to get him to engage in some conversation about it but he gets so agitated I can't even tell how much he understands his illness.  Substance abuse history: Nonidentified  Past Psychiatric History: Patient does not have any known past psychiatric history that I  have been able to identify although after interacting with him today and speaking with the hospitalist I am wondering more as to whether he might have an underlying bipolar disorder or other kind of mental illness since his behavior and presentation seems atypical for simply being demented.  Risk to Self: Is patient at risk for suicide?: No Risk to Others:   Prior Inpatient Therapy:   Prior Outpatient Therapy:    Past Medical History:  Past Medical  History:  Diagnosis Date  . Arthritis   . Diabetes mellitus without complication [facility])     Past Surgical History:  Procedure Laterality Date  . none     Family History:  Family History  Problem Relation Age of Onset  . Family history unknown: Yes   Family Psychiatric  History: Unknown Social History:  History  Alcohol Use No     History  Drug Use No    Social History   Social History  . Marital status: Single    Spouse name: N/A  . Number of children: N/A  . Years of education: N/A   Occupational History  . retired    Social History Main Topics  . Smoking status: Never Smoker  . Smokeless tobacco: Never Used  . Alcohol use No  . Drug use: No  . Sexual activity: Not Asked   Other Topics Concern  . None   Social History Narrative  . None   Additional Social History:    Allergies:   Allergies  Allergen Reactions  . Aspirin Nausea And Vomiting  . Escitalopram Other (See Comments)  . Metformin Other (See Comments)  . Quinine Nausea And Vomiting    Labs:  Results for orders placed or performed during the hospital encounter of 10/26/16 (from the past 48 hour(s))  Glucose, capillary     Status: Abnormal   Collection Time: 10/30/16  9:36 PM  Result Value Ref Range   Glucose-Capillary 327 (H) 65 - 99 mg/dL  Glucose, capillary     Status: Abnormal   Collection Time: 10/31/16  7:49 AM  Result Value Ref Range   Glucose-Capillary 244 (H) 65 - 99 mg/dL   Comment 1 Notify RN   Glucose, capillary     Status: Abnormal   Collection Time: 10/31/16 11:39 AM  Result Value Ref Range   Glucose-Capillary 179 (H) 65 - 99 mg/dL   Comment 1 Notify RN   Glucose, capillary     Status: Abnormal   Collection Time: 10/31/16  4:34 PM  Result Value Ref Range   Glucose-Capillary 148 (H) 65 - 99 mg/dL   Comment 1 Notify RN   Glucose, capillary     Status: Abnormal   Collection Time: 10/31/16  7:58 PM  Result Value Ref Range   Glucose-Capillary 170 (H) 65 - 99 mg/dL   Glucose, capillary     Status: Abnormal   Collection Time: 11/01/16  8:04 AM  Result Value Ref Range   Glucose-Capillary 139 (H) 65 - 99 mg/dL  Glucose, capillary     Status: Abnormal   Collection Time: 11/01/16 11:44 AM  Result Value Ref Range   Glucose-Capillary 149 (H) 65 - 99 mg/dL  Glucose, capillary     Status: Abnormal   Collection Time: 11/01/16  5:13 PM  Result Value Ref Range   Glucose-Capillary 128 (H) 65 - 99 mg/dL    Current Facility-Administered Medications  Medication Dose Route Frequency Provider Last Rate Last Dose  . acetaminophen (TYLENOL) tablet 650 mg  650 mg  Oral Q6H PRN Ihor Austin, MD       Or  . acetaminophen (TYLENOL) suppository 650 mg  650 mg Rectal Q6H PRN Pyreddy, Vivien Rota, MD      . amLODipine (NORVASC) tablet 2.5 mg  2.5 mg Oral Daily Pyreddy, Pavan, MD   2.5 mg at 11/01/16 1015  . enoxaparin (LOVENOX) injection 40 mg  40 mg Subcutaneous Q24H Pyreddy, Vivien Rota, MD   40 mg at 11/01/16 1014  . fluticasone (FLONASE) 50 MCG/ACT nasal spray 2 spray  2 spray Each Nare QHS Marguarite Arbour, MD   2 spray at 10/30/16 2327  . HYDROcodone-acetaminophen (NORCO) 10-325 MG per tablet 1 tablet  1 tablet Oral Q6H PRN Ihor Austin, MD   1 tablet at 11/01/16 1537  . insulin aspart (novoLOG) injection 0-15 Units  0-15 Units Subcutaneous TID WC Ihor Austin, MD   2 Units at 11/01/16 1731  . insulin aspart (novoLOG) injection 0-5 Units  0-5 Units Subcutaneous QHS Ihor Austin, MD   4 Units at 10/30/16 2328  . insulin aspart (novoLOG) injection 8 Units  8 Units Subcutaneous TID WC Milagros Loll, MD   8 Units at 11/01/16 1752  . insulin glargine (LANTUS) injection 36 Units  36 Units Subcutaneous Daily Milagros Loll, MD   36 Units at 11/01/16 1025  . LORazepam (ATIVAN) tablet 0.5 mg  0.5 mg Oral Q8H PRN Ihor Austin, MD   0.5 mg at 10/31/16 2226  . losartan (COZAAR) tablet 100 mg  100 mg Oral Daily Pyreddy, Pavan, MD   100 mg at 11/01/16 1015  . ondansetron (ZOFRAN)  tablet 4 mg  4 mg Oral Q6H PRN Ihor Austin, MD   4 mg at 10/29/16 2048   Or  . ondansetron (ZOFRAN) injection 4 mg  4 mg Intravenous Q6H PRN Pyreddy, Vivien Rota, MD      . risperiDONE (RISPERDAL M-TABS) disintegrating tablet 1 mg  1 mg Oral TID Lyda Colcord, Jackquline Denmark, MD   1 mg at 11/01/16 2042  . senna-docusate (Senokot-S) tablet 1 tablet  1 tablet Oral QHS PRN Ihor Austin, MD        Musculoskeletal: Strength & Muscle Tone: within normal limits Gait & Station: unsteady Patient leans: N/A  Psychiatric Specialty Exam: Physical Exam  Nursing note and vitals reviewed. Constitutional: He appears well-developed and well-nourished.  HENT:  Head: Normocephalic and atraumatic.  Eyes: Pupils are equal, round, and reactive to light. Conjunctivae are normal.  Neck: Normal range of motion.  Cardiovascular: Regular rhythm and normal heart sounds.   Respiratory: Effort normal. No respiratory distress.  GI: Soft.  Musculoskeletal: Normal range of motion.  Neurological: He is alert.  Skin: Skin is warm and dry.  Psychiatric: His affect is inappropriate. His affect is not labile. His speech is tangential. His speech is not rapid and/or pressured. Thought content is paranoid. Cognition and memory are impaired. He expresses impulsivity and inappropriate judgment. He expresses no homicidal and no suicidal ideation. He exhibits abnormal recent memory.    Review of Systems  Constitutional: Negative.   HENT: Negative.   Eyes: Negative.   Respiratory: Negative.   Cardiovascular: Negative.   Gastrointestinal: Negative.   Musculoskeletal: Negative.   Skin: Negative.   Neurological: Negative.   Psychiatric/Behavioral: Negative for depression, hallucinations, memory loss, substance abuse and suicidal ideas. The patient is not nervous/anxious and does not have insomnia.    [facility] Face-to-Face Psychiatry Consult   Reason for Consult:  Consult for 83 year old man who was brought back to the hospital almost  immediately after discharge because of his agitated behavior that prevented placement. Referring Physician:  Sudini Patient Identification: [facility] MRN:  [id number] Principal Diagnosis: Psychosis Diagnosis:   Patient Active Problem List   Diagnosis Date Noted  . Psychosis [F29] 10/29/2016  . Hyperglycemia [R73.9] 10/23/2016  . Dementia, Alzheimer's, with behavior disturbance [G30.9, F02.81] 10/23/2016    Total Time spent with patient: 1 hour  Subjective:   [facility] is a 83 y.o. male patient admitted with "get out of here! I want nothing to do with you! 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  HPI:  Patient interviewed chart reviewed. I saw this gentleman last week in the hospital as well. Last time he was here in the hospital he had presented with a very elevated blood sugar and with agitated oppositional behavior when the Department of Social Services tried to place him at the [place]. My evaluation last week was that this was a patient with dementia probably mixed vascular and Alzheimer's with psychotic symptoms and behavioral disturbance. I had taken him off commitment and suggested he be discharged and placed. Apparently this was attempted and once again the patient immediately became physically aggressive and disruptive to the point that it was impossible to place him and once again he came back to the hospital with a blood sugar of almost 700. I tried to speak with him this evening and the patient became agitated almost immediately upon seeing me. I tried to engage him in some conversation about his medical history and he just escalated more and more. Refused to listen to any reason from me or from any of the nursing aides patient appeared to be very confused at some points. 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 This appeared to refer to a conversation that he and the nurse had been having about a piece of music. He  was able to have brief bits of conversation that were lucid but then became emotionally very ramped up and I could not get him to calm down.  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 I have not spoken to any family and I'm not aware of any are currently involved. Patient is insisting that he will go back to his own home although it has been explained to him repeatedly that he is no longer able to make decisions for himself.  Medical history: Diabetes. Tried to get him to engage in some conversation about it but he gets so agitated I can't even tell how much he understands his illness.  Substance abuse history: Nonidentified  Past Psychiatric History: Patient does not have any known past psychiatric history that I have been able to identify although after interacting with him today and speaking with the hospitalist I am wondering more as to whether he might have an underlying bipolar disorder or other kind of mental illness since his behavior and presentation seems atypical for simply being demented.  Risk to Self: Is patient at risk for suicide?: No Risk to Others:   Prior Inpatient Therapy:   Prior Outpatient Therapy:    Past Medical History:  Past Medical History:  Diagnosis Date  . Arthritis   . Diabetes mellitus without complication [facility])     Past Surgical History:  Procedure Laterality Date  .  none     Family History:  Family History  Problem Relation Age of Onset  . Family history unknown: Yes   Family Psychiatric  History: Unknown Social History:  History  Alcohol Use No     History  Drug Use No    Social History   Social History  . Marital status: Single    Spouse name: N/A  . Number of children: N/A  . Years of education: N/A   Occupational History  . retired    Social History Main Topics  . Smoking status: Never Smoker  .  Smokeless tobacco: Never Used  . Alcohol use No  . Drug use: No  . Sexual activity: Not Asked   Other Topics Concern  . None   Social History Narrative  . None   Additional Social History:    Allergies:   Allergies  Allergen Reactions  . Aspirin Nausea And Vomiting  . Escitalopram Other (See Comments)  . Metformin Other (See Comments)  . Quinine Nausea And Vomiting    Labs:  Results for orders placed or performed during the hospital encounter of 10/26/16 (from the past 48 hour(s))  Glucose, capillary     Status: Abnormal   Collection Time: 10/30/16  9:36 PM  Result Value Ref Range   Glucose-Capillary 327 (H) 65 - 99 mg/dL  Glucose, capillary     Status: Abnormal   Collection Time: 10/31/16  7:49 AM  Result Value Ref Range   Glucose-Capillary 244 (H) 65 - 99 mg/dL   Comment 1 Notify RN   Glucose, capillary     Status: Abnormal   Collection Time: 10/31/16 11:39 AM  Result Value Ref Range   Glucose-Capillary 179 (H) 65 - 99 mg/dL   Comment 1 Notify RN   Glucose, capillary     Status: Abnormal   Collection Time: 10/31/16  4:34 PM  Result Value Ref Range   Glucose-Capillary 148 (H) 65 - 99 mg/dL   Comment 1 Notify RN   Glucose, capillary     Status: Abnormal   Collection Time: 10/31/16  7:58 PM  Result Value Ref Range   Glucose-Capillary 170 (H) 65 - 99 mg/dL  Glucose, capillary     Status: Abnormal   Collection Time: 11/01/16  8:04 AM  Result Value Ref Range   Glucose-Capillary 139 (H) 65 - 99 mg/dL  Glucose, capillary     Status: Abnormal   Collection Time: 11/01/16 11:44 AM  Result Value Ref Range   Glucose-Capillary 149 (H) 65 - 99 mg/dL  Glucose, capillary     Status: Abnormal   Collection Time: 11/01/16  5:13 PM  Result Value Ref Range   Glucose-Capillary 128 (H) 65 - 99 mg/dL    Current Facility-Administered Medications  Medication Dose Route Frequency Provider Last Rate Last Dose  . acetaminophen (TYLENOL) tablet 650 mg  650 mg Oral Q6H PRN Ihor Austin, MD       Or  . acetaminophen (TYLENOL) suppository 650 mg  650 mg Rectal Q6H PRN Pyreddy, Vivien Rota, MD      . amLODipine (NORVASC) tablet 2.5 mg  2.5 mg Oral Daily Pyreddy, Pavan, MD   2.5 mg at 11/01/16 1015  . enoxaparin (LOVENOX) injection 40 mg  40 mg Subcutaneous Q24H Pyreddy, Vivien Rota, MD   40 mg at 11/01/16 1014  . fluticasone (FLONASE) 50 MCG/ACT nasal spray 2 spray  2 spray Each Nare QHS Marguarite Arbour, MD   2 spray at 10/30/16 2327  . HYDROcodone-acetaminophen (NORCO)  10-325 MG per tablet 1 tablet  1 tablet Oral Q6H PRN Ihor Austin, MD   1 tablet at 11/01/16 1537  . insulin aspart (novoLOG) injection 0-15 Units  0-15 Units Subcutaneous TID WC Ihor Austin, MD   2 Units at 11/01/16 1731  . insulin aspart (novoLOG) injection 0-5 Units  0-5 Units Subcutaneous QHS Ihor Austin, MD   4 Units at 10/30/16 2328  . insulin aspart (novoLOG) injection 8 Units  8 Units Subcutaneous TID WC Milagros Loll, MD   8 Units at 11/01/16 1752  . insulin glargine (LANTUS) injection 36 Units  36 Units Subcutaneous Daily Milagros Loll, MD   36 Units at 11/01/16 1025  . LORazepam (ATIVAN) tablet 0.5 mg  0.5 mg Oral Q8H PRN Ihor Austin, MD   0.5 mg at 10/31/16 2226  . losartan (COZAAR) tablet 100 mg  100 mg Oral Daily Pyreddy, Pavan, MD   100 mg at 11/01/16 1015  . ondansetron (ZOFRAN) tablet 4 mg  4 mg Oral Q6H PRN Ihor Austin, MD   4 mg at 10/29/16 2048   Or  . ondansetron (ZOFRAN) injection 4 mg  4 mg Intravenous Q6H PRN Pyreddy, Vivien Rota, MD      . risperiDONE (RISPERDAL M-TABS) disintegrating tablet 1 mg  1 mg Oral TID Darril Patriarca, Jackquline Denmark, MD   1 mg at 11/01/16 2042  . senna-docusate (Senokot-S) tablet 1 tablet  1 tablet Oral QHS PRN Ihor Austin, MD        Musculoskeletal: Strength & Muscle Tone: within normal limits Gait & Station: unsteady Patient leans: N/A  Psychiatric Specialty Exam: Physical Exam  Nursing note and vitals reviewed. Constitutional: He appears well-developed and  well-nourished.  HENT:  Head: Normocephalic and atraumatic.  Eyes: Pupils are equal, round, and reactive to light. Conjunctivae are normal.  Neck: Normal range of motion.  Cardiovascular: Regular rhythm and normal heart sounds.   Respiratory: Effort normal. No respiratory distress.  GI: Soft.  Musculoskeletal: Normal range of motion.  Neurological: He is alert.  Skin: Skin is warm and dry.  Psychiatric: His affect is labile and inappropriate. His speech is rapid and/or pressured and tangential. He is agitated. Thought content is paranoid. Cognition and memory are impaired. He expresses impulsivity and inappropriate judgment. He expresses no homicidal and no suicidal ideation. He exhibits abnormal recent memory.    Review of Systems  Constitutional: Negative.   HENT: Negative.   Eyes: Negative.   Respiratory: Negative.   Cardiovascular: Negative.   Gastrointestinal: Negative.   Musculoskeletal: Negative.   Skin: Negative.   Neurological: Negative.   Psychiatric/Behavioral: Negative for depression, hallucinations, memory loss, substance abuse and suicidal ideas. The patient is not nervous/anxious and does not have insomnia.     Blood pressure 129/76, pulse (!) 103, temperature 98.2 F (36.8 C), temperature source Oral, resp. rate 20, height  (1.727 m), weight 76.4 kg (168 lb 6.4 oz), SpO2 97 %.Body mass index is 25.61 kg/m.  General Appearance: Casual  Eye Contact:  Fair  Speech:  Pressured  Volume:  Increased  Mood:  Angry  Affect:  Congruent and Inappropriate  Thought Process:  Disorganized  Orientation:  Negative  Thought Content:  Illogical, Paranoid Ideation and Tangential  Suicidal Thoughts:  No  Homicidal Thoughts:  No  Memory:  Negative  Judgement:  Poor  Insight:  Lacking  Psychomotor Activity:  Restlessness  Concentration:  Concentration: Poor  Recall:  Poor  Fund of Knowledge:  Poor  Language:  Fair  Akathisia:  No  Handed:  Right  AIMS (if indicated):      Assets:  Communication Skills  ADL's:  Impaired  Cognition:  Impaired,  Mild  Sleep:        Treatment Plan Summary: Daily contact with patient to assess and evaluate symptoms and progress in treatment, Medication management and Plan At this point I am more convinced that the patient probably does need psychiatric treatment. His behavior is irrational and not showing any signs of improving. If anything it is getting worse. I agreed with the hospitalist that the patient probably should be started on some medicine and have started risperidone. I started with a low dose but if he is not oversedated we can try to titrate that up. Under the current circumstances I can't see how we can discontinue the IVC given the failure to place him twice in a row. I appreciate social work's attempts to refer him to [facility] which I think is probably the best course for now. I will try to follow up as best I can.  Disposition: Recommend psychiatric Inpatient admission when medically cleared. Supportive therapy provided about ongoing stressors.  Mordecai Rasmussen, MD 11/01/2016 9:28 PM  Blood pressure 129/76, pulse (!) 103, temperature 98.2 F (36.8 C), temperature source Oral, resp. rate 20, height  (1.727 m), weight 76.4 kg (168 lb 6.4 oz), SpO2 97 %.Body mass index is 25.61 kg/m.  General Appearance: Casual  Eye Contact:  Fair  Speech:  Pressured  Volume:  Increased  Mood:  Angry  Affect:  Congruent and Inappropriate  Thought Process:  Disorganized  Orientation:  Negative  Thought Content:  Illogical, Paranoid Ideation and Tangential  Suicidal Thoughts:  No  Homicidal Thoughts:  No  Memory:  Negative  Judgement:  Poor  Insight:  Lacking  Psychomotor Activity:  Restlessness  Concentration:  Concentration: Poor  Recall:  Poor  Fund of Knowledge:  Poor  Language:  Fair  Akathisia:  No  Handed:  Right  AIMS (if indicated):     Assets:  Communication Skills  ADL's:  Impaired   Cognition:  Impaired,  Mild  Sleep:        Treatment Plan Summary: Daily contact with patient to assess and evaluate symptoms and progress in treatment, Medication management and Plan No change to treatment plan. Continue antipsychotics. Unfortunately allowing him to be discharged would simply at this point lead to a repeat cycle of returning to the emergency room since he is making it clear that he will be abusive and uncooperative with placement. Appreciate all the work social work is putting into this.  Disposition: Recommend psychiatric Inpatient admission when medically cleared. Supportive therapy provided about ongoing stressors.  Mordecai Rasmussen, MD 11/01/2016 9:28 PM

## 2016-11-01 NOTE — Clinical Social Work Note (Addendum)
Patient's IVC will need renewing tomorrow, 9/21 as this will be the 7th day. CSW spoke with [facility] and patient is still on their waitlist. [name] MSW,LCSW [phone]

## 2016-11-01 NOTE — Progress Notes (Signed)
[facility] - [place] at [facility]   PATIENT NAME: Jesus Holland    MR#:  [id number]  DATE OF BIRTH:  01-06-1929  SUBJECTIVE:  CHIEF COMPLAINT:   Chief Complaint  Patient presents with  . Psychiatric Evaluation   Patient has a sitter at bedside. IVC in place. Waiting for [facility] -[facility], patient is requesting if his pets can join him at those facility, if not he would not go there REVIEW OF SYSTEMS:    Review of Systems  Constitutional: Negative for chills, fever and weight loss.  HENT: Negative for nosebleeds and sore throat.   Eyes: Negative for blurred vision.  Respiratory: Negative for cough, shortness of breath and wheezing.   Cardiovascular: Negative for chest pain, orthopnea, leg swelling and PND.  Gastrointestinal: Negative for abdominal pain, constipation, diarrhea, heartburn, nausea and vomiting.  Genitourinary: Negative for dysuria and urgency.  Musculoskeletal: Negative for back pain.  Skin: Negative for rash.  Neurological: Negative for dizziness, speech change, focal weakness and headaches.  Endo/Heme/Allergies: Does not bruise/bleed easily.  Psychiatric/Behavioral: Negative for depression.   DRUG ALLERGIES:   Allergies  Allergen Reactions  . Aspirin Nausea And Vomiting  . Escitalopram Other (See Comments)  . Metformin Other (See Comments)  . Quinine Nausea And Vomiting    VITALS:  Blood pressure 129/76, pulse (!) 103, temperature 98.2 F (36.8 C), temperature source Oral, resp. rate 20, height  (1.727 m), weight 76.4 kg (168 lb 6.4 oz), SpO2 97 %.  PHYSICAL EXAMINATION:  Physical Exam  Constitutional: He is oriented to person, place, and time and well-developed, well-nourished, and in no distress.  HENT:  Head: Normocephalic and atraumatic.  Eyes: Pupils are equal, round, and reactive to light. Conjunctivae and EOM are normal.  Neck: Normal range of motion. Neck supple. No tracheal deviation present. No  thyromegaly present.  Cardiovascular: Normal rate, regular rhythm and normal heart sounds.   Pulmonary/Chest: Effort normal and breath sounds normal. No respiratory distress. He has no wheezes. He exhibits no tenderness.  Abdominal: Soft. Bowel sounds are normal. He exhibits no distension. There is no tenderness.  Musculoskeletal: Normal range of motion.  Neurological: He is alert and oriented to person, place, and time. No cranial nerve deficit.  Skin: Skin is warm and dry. No rash noted.  Psychiatric: Mood normal. His affect is inappropriate. He expresses impulsivity. He is concerned with wish fulfillment.   LABORATORY PANEL:   CBC  Recent Labs Lab 10/30/16 0941  WBC 8.6  HGB 13.1  HCT 37.4*  PLT 238   ------------------------------------------------------------------------------------------------------------------ Chemistries   Recent Labs Lab 10/26/16 2350 10/30/16 0941  NA 131* 140  K 4.9 4.5  CL 99* 107  CO2 22 27  GLUCOSE 681* 186*  BUN 22* 27*  CREATININE 1.15 0.98  CALCIUM 8.6* 8.6*  AST 22  --   ALT 15*  --   ALKPHOS 111  --   BILITOT 0.5  --    ------------------------------------------------------------------------------------------------------------------   ASSESSMENT AND PLAN:   * Uncontrolled DM with hyperglycemia: present on admission Due to missing insulin, now much better controlled - continue SSI On lantus and pre meal insulin  * Dementia with behavioral disturbance Has legal gaurdian and now in IVC with sitter due to patient refusing to agitation Difficult situation for placement. - [facility] - SW aware and working on it. -Waiting for [facility] bed at [facility] - continue risperidone & ativan prn - Appreciate psychiatry input  *  HTN - continue losartan and norvasc  *DVT prophylaxis with Lovenox   All the records are reviewed and case discussed with Care Management/Social Worker Management plans  discussed with the patient, nursing and they are in agreement.  CODE STATUS: FULL CODE  DVT Prophylaxis: SCDs  TOTAL TIME TAKING CARE OF THIS PATIENT: 20 minutes.   POSSIBLE D/C IN 1-2 DAYS, DEPENDING ON CLINICAL CONDITION. [facility]  Delfino Lovett M.D on 11/01/2016 at 3:23 PM  Between 7am to 6pm - Pager - [phone]  After 6pm go to [email or web address] - password [email or web address] [facility]  [facility]  Office  [phone]  CC: Primary care physician; Marguarite Arbour, MD  Note: This dictation was prepared with Dragon dictation along with smaller phrase technology. Any transcriptional errors that result from this process are unintentional.

## 2016-11-02 LAB — GLUCOSE, CAPILLARY
GLUCOSE-CAPILLARY: 172 mg/dL — AB (ref 65–99)
GLUCOSE-CAPILLARY: 205 mg/dL — AB (ref 65–99)
GLUCOSE-CAPILLARY: 93 mg/dL (ref 65–99)
Glucose-Capillary: 177 mg/dL — ABNORMAL HIGH (ref 65–99)
Glucose-Capillary: 189 mg/dL — ABNORMAL HIGH (ref 65–99)

## 2016-11-02 NOTE — Clinical Social Work Note (Addendum)
CSW has spoken to [facility] and confirmed that patient is still on the waitlist for [facility]. CSW provided [facility] with the mental health authorization number:  [id number] as well. Charge nurse contacted Dr. Toni Amend and reminded him that patient's IVC expires this evening and will need to be renewed today.   Meanwhile, DSS Guardian, Marylene Land, is working on a plan that CSW recommended and is trying to see if arrangements can be made to get patient back to [facility] independent living with moderate assistance in the home to ensure he takes his medications correctly, remains clean and environment remains sanitary, and that he is eating appropriately. She is not sure that this will happen but she is trying as this might be more appealing to  Patient is not yet to be told that this is being worked on as it may not be a possibility.  [name] MSW,LCSW [phone]

## 2016-11-02 NOTE — Consult Note (Signed)
[facility] Face-to-Face Psychiatry Consult   Reason for Consult:  Consult for 83 year old man who was brought back to the hospital almost immediately after discharge because of his agitated behavior that prevented placement. Referring Physician:  Sudini Patient Identification: Jesus Holland MRN:  [id number] Principal Diagnosis: Psychosis Diagnosis:   Patient Active Problem List   Diagnosis Date Noted  . Psychosis [F29] 10/29/2016  . Hyperglycemia [R73.9] 10/23/2016  . Dementia, Alzheimer's, with behavior disturbance [G30.9, F02.81] 10/23/2016    Total Time spent with patient: 20 minutes  Subjective:   Jesus Holland is a 83 y.o. male patient admitted with "get out of here! I want nothing to do with you! 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  Follow-up Friday the 21st. Patient was awake and alert and interactive today. Not nearly as angry as he has been in the past but still completely refusing any idea of placement. Does not appear to be having any bad reaction to the antipsychotic medicine.  HPI:  Patient interviewed chart reviewed. I saw this gentleman last week in the hospital as well. Last time he was here in the hospital he had presented with a very elevated blood sugar and with agitated oppositional behavior when the Department of Social Services tried to place him at the [facility]. My evaluation last week was that this was a patient with dementia probably mixed vascular and Alzheimer's with psychotic symptoms and behavioral disturbance. I had taken him off commitment and suggested he be discharged and placed. Apparently this was attempted and once again the patient immediately became physically aggressive and disruptive to the point that it was impossible to place him and once again he came back to the hospital with a blood sugar of almost 700. I tried to speak with him this evening and the patient became agitated almost immediately upon seeing me. I tried to engage him in some conversation about his  medical history and he just escalated more and more. Refused to listen to any reason from me or from any of the nursing aides patient appeared to be very confused at some points. 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 This appeared to refer to a conversation that he and the nurse had been having about a piece of music. He was able to have brief bits of conversation that were lucid but then became emotionally very ramped up and I could not get him to calm down.  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 I have not spoken to any family and I'm not aware of any are currently involved. Patient is insisting that he will go back to his own home although it has been explained to him repeatedly that he is no longer able to make decisions for himself.  Medical history: Diabetes. Tried to get him to engage in some conversation about it but he gets so agitated I can't even tell how much he understands his illness.  Substance abuse history: Nonidentified  Past Psychiatric History: Patient does not have any known past psychiatric history that I have been able to identify although after interacting with him today and speaking with the hospitalist I am wondering more as to whether he might have an underlying bipolar disorder or other kind of mental illness since his behavior and presentation  seems atypical for simply being demented.  Risk to Self: Is patient at risk for suicide?: No Risk to Others:   Prior Inpatient Therapy:   Prior Outpatient Therapy:    Past Medical History:  Past Medical History:  Diagnosis Date  . Arthritis   . Diabetes mellitus without complication [facility])     Past Surgical History:  Procedure Laterality Date  . none     Family History:  Family History  Problem Relation Age of Onset   . Family history unknown: Yes   Family Psychiatric  History: Unknown Social History:  History  Alcohol Use No     History  Drug Use No    Social History   Social History  . Marital status: Single    Spouse name: N/A  . Number of children: N/A  . Years of education: N/A   Occupational History  . retired    Social History Main Topics  . Smoking status: Never Smoker  . Smokeless tobacco: Never Used  . Alcohol use No  . Drug use: No  . Sexual activity: Not Asked   Other Topics Concern  . None   Social History Narrative  . None   Additional Social History:    Allergies:   Allergies  Allergen Reactions  . Aspirin Nausea And Vomiting  . Escitalopram Other (See Comments)  . Metformin Other (See Comments)  . Quinine Nausea And Vomiting    Labs:  Results for orders placed or performed during the hospital encounter of 10/26/16 (from the past 48 hour(s))  Glucose, capillary     Status: Abnormal   Collection Time: 10/31/16  7:58 PM  Result Value Ref Range   Glucose-Capillary 170 (H) 65 - 99 mg/dL  Glucose, capillary     Status: Abnormal   Collection Time: 11/01/16  8:04 AM  Result Value Ref Range   Glucose-Capillary 139 (H) 65 - 99 mg/dL  Glucose, capillary     Status: Abnormal   Collection Time: 11/01/16 11:44 AM  Result Value Ref Range   Glucose-Capillary 149 (H) 65 - 99 mg/dL  Glucose, capillary     Status: Abnormal   Collection Time: 11/01/16  5:13 PM  Result Value Ref Range   Glucose-Capillary 128 (H) 65 - 99 mg/dL  Glucose, capillary     Status: Abnormal   Collection Time: 11/01/16  9:29 PM  Result Value Ref Range   Glucose-Capillary 237 (H) 65 - 99 mg/dL   Comment 1 Notify RN   Glucose, capillary     Status: Abnormal   Collection Time: 11/02/16  7:27 AM  Result Value Ref Range   Glucose-Capillary 172 (H) 65 - 99 mg/dL  Glucose, capillary     Status: Abnormal   Collection Time: 11/02/16  8:53 AM  Result Value Ref Range   Glucose-Capillary 205 (H)  65 - 99 mg/dL  Glucose, capillary     Status: Abnormal   Collection Time: 11/02/16 11:37 AM  Result Value Ref Range   Glucose-Capillary 177 (H) 65 - 99 mg/dL   Comment 1 Notify RN   Glucose, capillary     Status: None   Collection Time: 11/02/16  5:13 PM  Result Value Ref Range   Glucose-Capillary 93 65 - 99 mg/dL    Current Facility-Administered Medications  Medication Dose Route Frequency Provider Last Rate Last Dose  . acetaminophen (TYLENOL) tablet 650 mg  650 mg Oral Q6H PRN Ihor Austin, MD       Or  . acetaminophen (TYLENOL) suppository  650 mg  650 mg Rectal Q6H PRN Pyreddy, Pavan, MIhor AustinLODipine (NORVASC) tablet 2.5 mg  2.5 mg Oral Daily Pyreddy, Pavan, MD   2.5 mg at 11/02/16 1055  . enoxaparin (LOVENOX) injection 40 mg  40 mg Subcutaneous Q24H Pyreddy, Vivien Rota, MD   40 mg at 11/02/16 1055  . fluticasone (FLONASE) 50 MCG/ACT nasal spray 2 spray  2 spray Each Nare QHS Marguarite Arbour, MD   2 spray at 11/01/16 2200  . HYDROcodone-acetaminophen (NORCO) 10-325 MG per tablet 1 tablet  1 tablet Oral Q6H PRN Ihor Austin, MD   1 tablet at 11/02/16 1621  . insulin aspart (novoLOG) injection 0-15 Units  0-15 Units Subcutaneous TID WC Ihor Austin, MD   3 Units at 11/02/16 1232  . insulin aspart (novoLOG) injection 0-5 Units  0-5 Units Subcutaneous QHS Ihor Austin, MD   2 Units at 11/01/16 2200  . insulin aspart (novoLOG) injection 8 Units  8 Units Subcutaneous TID WC Milagros Loll, MD   8 Units at 11/02/16 1803  . insulin glargine (LANTUS) injection 36 Units  36 Units Subcutaneous Daily Milagros Loll, MD   36 Units at 11/02/16 1056  . LORazepam (ATIVAN) tablet 0.5 mg  0.5 mg Oral Q8H PRN Ihor Austin, MD   0.5 mg at 10/31/16 2226  . losartan (COZAAR) tablet 100 mg  100 mg Oral Daily Pyreddy, Pavan, MD   100 mg at 11/02/16 1055  . ondansetron (ZOFRAN) tablet 4 mg  4 mg Oral Q6H PRN Ihor Austin, MD   4 mg at 10/29/16 2048   Or  . ondansetron (ZOFRAN) injection 4 mg  4  mg Intravenous Q6H PRN Pyreddy, Vivien Rota, MD      . risperiDONE (RISPERDAL M-TABS) disintegrating tablet 1 mg  1 mg Oral TID Shalicia Craghead, Jackquline Denmark, MD   1 mg at 11/02/16 1713  . senna-docusate (Senokot-S) tablet 1 tablet  1 tablet Oral QHS PRN Ihor Austin, MD        Musculoskeletal: Strength & Muscle Tone: within normal limits Gait & Station: unsteady Patient leans: N/A  Psychiatric Specialty Exam: Physical Exam  Nursing note and vitals reviewed. Constitutional: He appears well-developed and well-nourished.  HENT:  Head: Normocephalic and atraumatic.  Eyes: Pupils are equal, round, and reactive to light. Conjunctivae are normal.  Neck: Normal range of motion.  Cardiovascular: Regular rhythm and normal heart sounds.   Respiratory: Effort normal. No respiratory distress.  GI: Soft.  Musculoskeletal: Normal range of motion.  Neurological: He is alert.  Skin: Skin is warm and dry.  Psychiatric: His affect is inappropriate. His affect is not labile. His speech is tangential. His speech is not rapid and/or pressured. Thought content is paranoid. Cognition and memory are impaired. He expresses impulsivity and inappropriate judgment. He expresses no homicidal and no suicidal ideation. He exhibits abnormal recent memory.    Review of Systems  Constitutional: Negative.   HENT: Negative.   Eyes: Negative.   Respiratory: Negative.   Cardiovascular: Negative.   Gastrointestinal: Negative.   Musculoskeletal: Negative.   Skin: Negative.   Neurological: Negative.   Psychiatric/Behavioral: Negative for depression, hallucinations, memory loss, substance abuse and suicidal ideas. The patient is not nervous/anxious and does not have insomnia.    [facility] Face-to-Face Psychiatry Consult   Reason for Consult:  Consult for 83 year old man who was brought back to the hospital almost immediately after discharge because of his agitated behavior that prevented placement. Referring Physician:  Sudini Patient  Identification:  Jesus Holland MRN:  [id number] Principal Diagnosis: Psychosis Diagnosis:   Patient Active Problem List   Diagnosis Date Noted  . Psychosis [F29] 10/29/2016  . Hyperglycemia [R73.9] 10/23/2016  . Dementia, Alzheimer's, with behavior disturbance [G30.9, F02.81] 10/23/2016    Total Time spent with patient: 1 hour  Subjective:   Jesus Holland is a 83 y.o. male patient admitted with "get out of here! I want nothing to do with you! 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  HPI:  Patient interviewed chart reviewed. I saw this gentleman last week in the hospital as well. Last time he was here in the hospital he had presented with a very elevated blood sugar and with agitated oppositional behavior when the Department of Social Services tried to place him at the [facility]. My evaluation last week was that this was a patient with dementia probably mixed vascular and Alzheimer's with psychotic symptoms and behavioral disturbance. I had taken him off commitment and suggested he be discharged and placed. Apparently this was attempted and once again the patient immediately became physically aggressive and disruptive to the point that it was impossible to place him and once again he came back to the hospital with a blood sugar of almost 700. I tried to speak with him this evening and the patient became agitated almost immediately upon seeing me. I tried to engage him in some conversation about his medical history and he just escalated more and more. Refused to listen to any reason from me or from any of the nursing aides patient appeared to be very confused at some points. 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 This appeared to refer to a conversation that he and the nurse had been having about a piece of music. He was able to have brief bits of conversation that were lucid but then became emotionally very ramped up and I could not get him  to calm down.  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 I have not spoken to any family and I'm not aware of any are currently involved. Patient is insisting that he will go back to his own home although it has been explained to him repeatedly that he is no longer able to make decisions for himself.  Medical history: Diabetes. Tried to get him to engage in some conversation about it but he gets so agitated I can't even tell how much he understands his illness.  Substance abuse history: Nonidentified  Past Psychiatric History: Patient does not have any known past psychiatric history that I have been able to identify although after interacting with him today and speaking with the hospitalist I am wondering more as to whether he might have an underlying bipolar disorder or other kind of mental illness since his behavior and presentation seems atypical for simply being demented.  Risk to Self: Is patient at risk for suicide?: No Risk to Others:   Prior Inpatient Therapy:   Prior Outpatient Therapy:    Past Medical History:  Past Medical History:  Diagnosis Date  . Arthritis   . Diabetes mellitus without complication [facility])     Past Surgical History:  Procedure Laterality Date  . none     Family History:  Family History  Problem Relation Age of Onset  .  Family history unknown: Yes   Family Psychiatric  History: Unknown Social History:  History  Alcohol Use No     History  Drug Use No    Social History   Social History  . Marital status: Single    Spouse name: N/A  . Number of children: N/A  . Years of education: N/A   Occupational History  . retired    Social History Main Topics  . Smoking status: Never Smoker  . Smokeless tobacco: Never Used  . Alcohol use No  . Drug use: No  . Sexual activity: Not Asked   Other Topics Concern  .  None   Social History Narrative  . None   Additional Social History:    Allergies:   Allergies  Allergen Reactions  . Aspirin Nausea And Vomiting  . Escitalopram Other (See Comments)  . Metformin Other (See Comments)  . Quinine Nausea And Vomiting    Labs:  Results for orders placed or performed during the hospital encounter of 10/26/16 (from the past 48 hour(s))  Glucose, capillary     Status: Abnormal   Collection Time: 10/31/16  7:58 PM  Result Value Ref Range   Glucose-Capillary 170 (H) 65 - 99 mg/dL  Glucose, capillary     Status: Abnormal   Collection Time: 11/01/16  8:04 AM  Result Value Ref Range   Glucose-Capillary 139 (H) 65 - 99 mg/dL  Glucose, capillary     Status: Abnormal   Collection Time: 11/01/16 11:44 AM  Result Value Ref Range   Glucose-Capillary 149 (H) 65 - 99 mg/dL  Glucose, capillary     Status: Abnormal   Collection Time: 11/01/16  5:13 PM  Result Value Ref Range   Glucose-Capillary 128 (H) 65 - 99 mg/dL  Glucose, capillary     Status: Abnormal   Collection Time: 11/01/16  9:29 PM  Result Value Ref Range   Glucose-Capillary 237 (H) 65 - 99 mg/dL   Comment 1 Notify RN   Glucose, capillary     Status: Abnormal   Collection Time: 11/02/16  7:27 AM  Result Value Ref Range   Glucose-Capillary 172 (H) 65 - 99 mg/dL  Glucose, capillary     Status: Abnormal   Collection Time: 11/02/16  8:53 AM  Result Value Ref Range   Glucose-Capillary 205 (H) 65 - 99 mg/dL  Glucose, capillary     Status: Abnormal   Collection Time: 11/02/16 11:37 AM  Result Value Ref Range   Glucose-Capillary 177 (H) 65 - 99 mg/dL   Comment 1 Notify RN   Glucose, capillary     Status: None   Collection Time: 11/02/16  5:13 PM  Result Value Ref Range   Glucose-Capillary 93 65 - 99 mg/dL    Current Facility-Administered Medications  Medication Dose Route Frequency Provider Last Rate Last Dose  . acetaminophen (TYLENOL) tablet 650 mg  650 mg Oral Q6H PRN Ihor Austin, MD        Or  . acetaminophen (TYLENOL) suppository 650 mg  650 mg Rectal Q6H PRN Pyreddy, Vivien Rota, MD      . amLODipine (NORVASC) tablet 2.5 mg  2.5 mg Oral Daily Pyreddy, Pavan, MD   2.5 mg at 11/02/16 1055  . enoxaparin (LOVENOX) injection 40 mg  40 mg Subcutaneous Q24H Pyreddy, Vivien Rota, MD   40 mg at 11/02/16 1055  . fluticasone (FLONASE) 50 MCG/ACT nasal spray 2 spray  2 spray Each Nare QHS Marguarite Arbour, MD   2 spray at 11/01/16  2200  . HYDROcodone-acetaminophen (NORCO) 10-325 MG per tablet 1 tablet  1 tablet Oral Q6H PRN Ihor Austin, MD   1 tablet at 11/02/16 1621  . insulin aspart (novoLOG) injection 0-15 Units  0-15 Units Subcutaneous TID WC Ihor Austin, MD   3 Units at 11/02/16 1232  . insulin aspart (novoLOG) injection 0-5 Units  0-5 Units Subcutaneous QHS Ihor Austin, MD   2 Units at 11/01/16 2200  . insulin aspart (novoLOG) injection 8 Units  8 Units Subcutaneous TID WC Milagros Loll, MD   8 Units at 11/02/16 1803  . insulin glargine (LANTUS) injection 36 Units  36 Units Subcutaneous Daily Milagros Loll, MD   36 Units at 11/02/16 1056  . LORazepam (ATIVAN) tablet 0.5 mg  0.5 mg Oral Q8H PRN Ihor Austin, MD   0.5 mg at 10/31/16 2226  . losartan (COZAAR) tablet 100 mg  100 mg Oral Daily Pyreddy, Pavan, MD   100 mg at 11/02/16 1055  . ondansetron (ZOFRAN) tablet 4 mg  4 mg Oral Q6H PRN Ihor Austin, MD   4 mg at 10/29/16 2048   Or  . ondansetron (ZOFRAN) injection 4 mg  4 mg Intravenous Q6H PRN Pyreddy, Vivien Rota, MD      . risperiDONE (RISPERDAL M-TABS) disintegrating tablet 1 mg  1 mg Oral TID Kymberlyn Eckford, Jackquline Denmark, MD   1 mg at 11/02/16 1713  . senna-docusate (Senokot-S) tablet 1 tablet  1 tablet Oral QHS PRN Ihor Austin, MD        Musculoskeletal: Strength & Muscle Tone: within normal limits Gait & Station: unsteady Patient leans: N/A  Psychiatric Specialty Exam: Physical Exam  Nursing note and vitals reviewed. Constitutional: He appears well-developed and well-nourished.   HENT:  Head: Normocephalic and atraumatic.  Eyes: Pupils are equal, round, and reactive to light. Conjunctivae are normal.  Neck: Normal range of motion.  Cardiovascular: Regular rhythm and normal heart sounds.   Respiratory: Effort normal. No respiratory distress.  GI: Soft.  Musculoskeletal: Normal range of motion.  Neurological: He is alert.  Skin: Skin is warm and dry.  Psychiatric: His affect is labile and inappropriate. His speech is rapid and/or pressured and tangential. He is agitated. Thought content is paranoid. Cognition and memory are impaired. He expresses impulsivity and inappropriate judgment. He expresses no homicidal and no suicidal ideation. He exhibits abnormal recent memory.    Review of Systems  Constitutional: Negative.   HENT: Negative.   Eyes: Negative.   Respiratory: Negative.   Cardiovascular: Negative.   Gastrointestinal: Negative.   Musculoskeletal: Negative.   Skin: Negative.   Neurological: Negative.   Psychiatric/Behavioral: Negative for depression, hallucinations, memory loss, substance abuse and suicidal ideas. The patient is not nervous/anxious and does not have insomnia.     Blood pressure 140/64, pulse 84, temperature 98.2 F (36.8 C), temperature source Oral, resp. rate 20, height 5\' 8"  (1.727 m), weight 76.4 kg (168 lb 6.4 oz), SpO2 95 %.Body mass index is 25.61 kg/m.  General Appearance: Casual  Eye Contact:  Fair  Speech:  Pressured  Volume:  Increased  Mood:  Angry  Affect:  Congruent and Inappropriate  Thought Process:  Disorganized  Orientation:  Negative  Thought Content:  Illogical, Paranoid Ideation and Tangential  Suicidal Thoughts:  No  Homicidal Thoughts:  No  Memory:  Negative  Judgement:  Poor  Insight:  Lacking  Psychomotor Activity:  Restlessness  Concentration:  Concentration: Poor  Recall:  Poor  Fund of Knowledge:  Poor  Language:  Fair  Akathisia:  No  Handed:  Right  AIMS (if indicated):     Assets:   Communication Skills  ADL's:  Impaired  Cognition:  Impaired,  Mild  Sleep:        Treatment Plan Summary: Daily contact with patient to assess and evaluate symptoms and progress in treatment, Medication management and Plan At this point I am more convinced that the patient probably does need psychiatric treatment. His behavior is irrational and not showing any signs of improving. If anything it is getting worse. I agreed with the hospitalist that the patient probably should be started on some medicine and have started risperidone. I started with a low dose but if he is not oversedated we can try to titrate that up. Under the current circumstances I can't see how we can discontinue the IVC given the failure to place him twice in a row. I appreciate social work's attempts to refer him to geriatric psychiatry hospitals which I think is probably the best course for now. I will try to follow up as best I can.  Disposition: Recommend psychiatric Inpatient admission when medically cleared. Supportive therapy provided about ongoing stressors.  Mordecai Rasmussen, MD 11/02/2016 7:34 PM  Blood pressure 140/64, pulse 84, temperature 98.2 F (36.8 C), temperature source Oral, resp. rate 20, height  (1.727 m), weight 76.4 kg (168 lb 6.4 oz), SpO2 95 %.Body mass index is 25.61 kg/m.  General Appearance: Casual  Eye Contact:  Fair  Speech:  Pressured  Volume:  Increased  Mood:  Angry  Affect:  Congruent and Inappropriate  Thought Process:  Disorganized  Orientation:  Negative  Thought Content:  Illogical, Paranoid Ideation and Tangential  Suicidal Thoughts:  No  Homicidal Thoughts:  No  Memory:  Negative  Judgement:  Poor  Insight:  Lacking  Psychomotor Activity:  Restlessness  Concentration:  Concentration: Poor  Recall:  Poor  Fund of Knowledge:  Poor  Language:  Fair  Akathisia:  No  Handed:  Right  AIMS (if indicated):     Assets:  Communication Skills  ADL's:  Impaired  Cognition:   Impaired,  Mild  Sleep:        Treatment Plan Summary: Daily contact with patient to assess and evaluate symptoms and progress in treatment, Medication management and Plan Commitment has expired. Patient was re-committed based on his refusal to cooperate with safe treatment planning or take care of himself. Continues to be delusional about his guardianship. Patient continues to be a good candidate for geriatric psychiatry hospitalization if possible. Doesn't seem to be any progress on any negotiation for placement at this point. No change to medicine.  Disposition: Recommend psychiatric Inpatient admission when medically cleared. Supportive therapy provided about ongoing stressors.  Mordecai Rasmussen, MD 11/02/2016 7:34 PM

## 2016-11-02 NOTE — Plan of Care (Signed)
Problem: Education: Goal: Knowledge of [facility] Education information/materials will improve Outcome: Not Progressing Pt not receptive to education due to dementia and difficulty remembering or understanding.  Problem: Health Behavior/Discharge Planning: Goal: Ability to manage health-related needs will improve Outcome: Not Progressing Pt dependent on others for management of diabetes though he is unaware and insists he can handle himself.

## 2016-11-02 NOTE — Progress Notes (Signed)
SOUND Physicians - [place] at [facility]   PATIENT NAME: Jesus Holland    MR#:  [id number]  DATE OF BIRTH:  Oct 14, 1928  SUBJECTIVE:  CHIEF COMPLAINT:   Chief Complaint  Patient presents with  . Psychiatric Evaluation   Patient has a sitter at bedside. IVC in place.  REVIEW OF SYSTEMS:    Review of Systems  Constitutional: Negative for chills, fever and weight loss.  HENT: Negative for nosebleeds and sore throat.   Eyes: Negative for blurred vision.  Respiratory: Negative for cough, shortness of breath and wheezing.   Cardiovascular: Negative for chest pain, orthopnea, leg swelling and PND.  Gastrointestinal: Negative for abdominal pain, constipation, diarrhea, heartburn, nausea and vomiting.  Genitourinary: Negative for dysuria and urgency.  Musculoskeletal: Negative for back pain.  Skin: Negative for rash.  Neurological: Negative for dizziness, speech change, focal weakness and headaches.  Endo/Heme/Allergies: Does not bruise/bleed easily.  Psychiatric/Behavioral: Negative for depression.   DRUG ALLERGIES:   Allergies  Allergen Reactions  . Aspirin Nausea And Vomiting  . Escitalopram Other (See Comments)  . Metformin Other (See Comments)  . Quinine Nausea And Vomiting    VITALS:  Blood pressure 140/64, pulse 84, temperature 98.2 F (36.8 C), temperature source Oral, resp. rate 20, height  (1.727 m), weight 168 lb 6.4 oz (76.4 kg), SpO2 95 %.  PHYSICAL EXAMINATION:  Physical Exam  Constitutional: He is oriented to person, place, and time and well-developed, well-nourished, and in no distress.  HENT:  Head: Normocephalic and atraumatic.  Eyes: Pupils are equal, round, and reactive to light. Conjunctivae and EOM are normal.  Neck: Normal range of motion. Neck supple. No tracheal deviation present. No thyromegaly present.  Cardiovascular: Normal rate, regular rhythm and normal heart sounds.   Pulmonary/Chest: Effort normal and breath sounds normal.  No respiratory distress. He has no wheezes. He exhibits no tenderness.  Abdominal: Soft. Bowel sounds are normal. He exhibits no distension. There is no tenderness.  Musculoskeletal: Normal range of motion.  Neurological: He is alert and oriented to person, place, and time. No cranial nerve deficit.  Skin: Skin is warm and dry. No rash noted.  Psychiatric: Mood normal. His affect is inappropriate. He expresses impulsivity. He is concerned with wish fulfillment.   LABORATORY PANEL:   CBC  Recent Labs Lab 10/30/16 0941  WBC 8.6  HGB 13.1  HCT 37.4*  PLT 238   ------------------------------------------------------------------------------------------------------------------ Chemistries   Recent Labs Lab 10/26/16 2350 10/30/16 0941  NA 131* 140  K 4.9 4.5  CL 99* 107  CO2 22 27  GLUCOSE 681* 186*  BUN 22* 27*  CREATININE 1.15 0.98  CALCIUM 8.6* 8.6*  AST 22  --   ALT 15*  --   ALKPHOS 111  --   BILITOT 0.5  --    ------------------------------------------------------------------------------------------------------------------   ASSESSMENT AND PLAN:   * Uncontrolled DM with hyperglycemia: present on admission Due to missing insulin, now much better controlled - continue SSI On lantus and pre meal insulin:Blood sugar overall labile but stable  * Dementia with behavioral disturbance Has legal gaurdian and now in IVC with sitter due to patient refusing to agitation Difficult situation for placement. - [facility] and working on it. -Waiting for [facility] bed at [facility] - continue risperidone & ativan prn - Appreciate psychiatry input  * HTN - continue losartan and norvasc  *DVT prophylaxis with Lovenox   All the records are reviewed and case discussed  with Care Management/Social Worker Management plans discussed with the patient, nursing and they are in agreement.  CODE STATUS: FULL CODE  DVT Prophylaxis:  SCDs  TOTAL TIME TAKING CARE OF THIS PATIENT: 20 minutes.   POSSIBLE D/C IN 1-2 DAYS, DEPENDING ON CLINICAL CONDITION. [facility]  Auburn Bilberry M.D on 11/02/2016 at 2:31 PM  Between 7am to 6pm - Pager - [phone]  After 6pm go to [email or web address] - password [email or web address] [facility]  [facility] [place] [facility]  Office  [phone]  CC: Primary care physician; Marguarite Arbour, MD  Note: This dictation was prepared with Dragon dictation along with smaller phrase technology. Any transcriptional errors that result from this process are unintentional.

## 2016-11-03 LAB — CREATININE, SERUM
CREATININE: 0.93 mg/dL (ref 0.61–1.24)
GFR calc Af Amer: >60 mL/min (ref >60)
GFR calc non Af Amer: >60 mL/min (ref >60)

## 2016-11-03 LAB — GLUCOSE, CAPILLARY
GLUCOSE-CAPILLARY: 67 mg/dL (ref 65–99)
GLUCOSE-CAPILLARY: 120 mg/dL — AB (ref 65–99)
GLUCOSE-CAPILLARY: 51 mg/dL — AB (ref 65–99)
GLUCOSE-CAPILLARY: 61 mg/dL — AB (ref 65–99)
Glucose-Capillary: 100 mg/dL — ABNORMAL HIGH (ref 65–99)
Glucose-Capillary: 215 mg/dL — ABNORMAL HIGH (ref 65–99)
Glucose-Capillary: 114 mg/dL — ABNORMAL HIGH (ref 65–99)

## 2016-11-03 NOTE — Progress Notes (Signed)
SOUND Phys[facility] - [place] at [facility]   PATIENT NAME: Jesus Holland    MR#:  [id number]  DATE OF BIRTH:  12/16/1928  SUBJECTIVE:  CHIEF COMPLAINT:   Chief Complaint  Patient presents with  . Psychiatric Evaluation   Patient walking around in the room appears in no distress  REVIEW OF SYSTEMS:    Review of Systems  Constitutional: Negative for chills, fever and weight loss.  HENT: Negative for nosebleeds and sore throat.   Eyes: Negative for blurred vision.  Respiratory: Negative for cough, shortness of breath and wheezing.   Cardiovascular: Negative for chest pain, orthopnea, leg swelling and PND.  Gastrointestinal: Negative for abdominal pain, constipation, diarrhea, heartburn, nausea and vomiting.  Genitourinary: Negative for dysuria and urgency.  Musculoskeletal: Negative for back pain.  Skin: Negative for rash.  Neurological: Negative for dizziness, speech change, focal weakness and headaches.  Endo/Heme/Allergies: Does not bruise/bleed easily.  Psychiatric/Behavioral: Negative for depression.   DRUG ALLERGIES:   Allergies  Allergen Reactions  . Aspirin Nausea And Vomiting  . Escitalopram Other (See Comments)  . Metformin Other (See Comments)  . Quinine Nausea And Vomiting    VITALS:  Blood pressure (!) 140/59, pulse 97, temperature (!) 97.5 F (36.4 C), temperature source Oral, resp. rate 16, height  (1.727 m), weight 168 lb 6.4 oz (76.4 kg), SpO2 100 %.  PHYSICAL EXAMINATION:  Physical Exam  Constitutional: He is oriented to person, place, and time and well-developed, well-nourished, and in no distress.  HENT:  Head: Normocephalic and atraumatic.  Eyes: Pupils are equal, round, and reactive to light. Conjunctivae and EOM are normal.  Neck: Normal range of motion. Neck supple. No tracheal deviation present. No thyromegaly present.  Cardiovascular: Normal rate, regular rhythm and normal heart sounds.   Pulmonary/Chest: Effort normal and  breath sounds normal. No respiratory distress. He has no wheezes. He exhibits no tenderness.  Abdominal: Soft. Bowel sounds are normal. He exhibits no distension. There is no tenderness.  Musculoskeletal: Normal range of motion.  Neurological: He is alert and oriented to person, place, and time. No cranial nerve deficit.  Skin: Skin is warm and dry. No rash noted.  Psychiatric: Mood normal. His affect is inappropriate. He expresses impulsivity. He is concerned with wish fulfillment.   LABORATORY PANEL:   CBC  Recent Labs Lab 10/30/16 0941  WBC 8.6  HGB 13.1  HCT 37.4*  PLT 238   ------------------------------------------------------------------------------------------------------------------ Chemistries   Recent Labs Lab 10/30/16 0941 11/03/16 0548  NA 140  --   K 4.5  --   CL 107  --   CO2 27  --   GLUCOSE 186*  --   BUN 27*  --   CREATININE 0.98 0.93  CALCIUM 8.6*  --    ------------------------------------------------------------------------------------------------------------------   ASSESSMENT AND PLAN:   * Uncontrolled DM with hyperglycemia: present on admission Due to missing insulin, now much better controlled - continue SSI On lantus and pre meal insulin:Blood sugar overall labile but stable  * Dementia with behavioral disturbance Has legal gaurdian and now in IVC with sitter due to patient refusing to agitation Difficult situation for placement. - [facility] - [facility] aware and working on it. -Waiting for [facility] bed at [facility] - continue risperidone & ativan prn - Appreciate psychiatry input  * HTN - continue losartan and norvasc  *DVT prophylaxis with Lovenox   All the records are reviewed and case discussed with Care Management/Social Worker Management plans discussed  with the patient, nursing and they are in agreement.  CODE STATUS: FULL CODE  DVT Prophylaxis: SCDs  TOTAL TIME TAKING CARE OF THIS  PATIENT: 20 minutes.   POSSIBLE D/C IN 1-2 DAYS, DEPENDING ON CLINICAL CONDITION. [facility]  Auburn Bilberry M.D on 11/03/2016 at 2:48 PM  Between 7am to 6pm - Pager - [phone]  After 6pm go to [email or web address] - password [email or web address] [facility]  [facility] [place] [facility]  Office  [phone]  CC: Primary care physician; Marguarite Arbour, MD  Note: This dictation was prepared with Dragon dictation along with smaller phrase technology. Any transcriptional errors that result from this process are unintentional.

## 2016-11-03 NOTE — Plan of Care (Signed)
Problem: Education: Goal: Knowledge of [facility] Education information/materials will improve Outcome: Not Progressing Pt has dementia, often refuses teaching or forgets information after presentation

## 2016-11-04 LAB — GLUCOSE, CAPILLARY
GLUCOSE-CAPILLARY: 81 mg/dL (ref 65–99)
GLUCOSE-CAPILLARY: 62 mg/dL — AB (ref 65–99)
GLUCOSE-CAPILLARY: 98 mg/dL (ref 65–99)
GLUCOSE-CAPILLARY: 66 mg/dL (ref 65–99)
GLUCOSE-CAPILLARY: 152 mg/dL — AB (ref 65–99)
Glucose-Capillary: 290 mg/dL — ABNORMAL HIGH (ref 65–99)

## 2016-11-04 NOTE — Progress Notes (Signed)
S[facility] Phys[facility] - [place] at [facility]   PATIENT NAME: Jesus Holland    MR#:  [id number]  DATE OF BIRTH:  26-Jan-1929  SUBJECTIVE:  CHIEF COMPLAINT:   Chief Complaint  Patient presents with  . Psychiatric Evaluation  wants to know when he will be able to leave  REVIEW OF SYSTEMS:    Review of Systems  Constitutional: Negative for chills, fever and weight loss.  HENT: Negative for nosebleeds and sore throat.   Eyes: Negative for blurred vision.  Respiratory: Negative for cough, shortness of breath and wheezing.   Cardiovascular: Negative for chest pain, orthopnea, leg swelling and PND.  Gastrointestinal: Negative for abdominal pain, constipation, diarrhea, heartburn, nausea and vomiting.  Genitourinary: Negative for dysuria and urgency.  Musculoskeletal: Negative for back pain.  Skin: Negative for rash.  Neurological: Negative for dizziness, speech change, focal weakness and headaches.  Endo/Heme/Allergies: Does not bruise/bleed easily.  Psychiatric/Behavioral: Negative for depression.   DRUG ALLERGIES:   Allergies  Allergen Reactions  . Aspirin Nausea And Vomiting  . Escitalopram Other (See Comments)  . Metformin Other (See Comments)  . Quinine Nausea And Vomiting    VITALS:  Blood pressure (!) 141/68, pulse 76, temperature 98.5 F (36.9 C), temperature source Oral, resp. rate 18, height  (1.727 m), weight 168 lb 6.4 oz (76.4 kg), SpO2 99 %.  PHYSICAL EXAMINATION:  Physical Exam  Constitutional: He is oriented to person, place, and time and well-developed, well-nourished, and in no distress.  HENT:  Head: Normocephalic and atraumatic.  Eyes: Pupils are equal, round, and reactive to light. Conjunctivae and EOM are normal.  Neck: Normal range of motion. Neck supple. No tracheal deviation present. No thyromegaly present.  Cardiovascular: Normal rate, regular rhythm and normal heart sounds.   Pulmonary/Chest: Effort normal and breath sounds normal. No  respiratory distress. He has no wheezes. He exhibits no tenderness.  Abdominal: Soft. Bowel sounds are normal. He exhibits no distension. There is no tenderness.  Musculoskeletal: Normal range of motion.  Neurological: He is alert and oriented to person, place, and time. No cranial nerve deficit.  Skin: Skin is warm and dry. No rash noted.  Psychiatric: Mood normal. His affect is inappropriate. He expresses impulsivity. He is concerned with wish fulfillment.   LABORATORY PANEL:   CBC  Recent Labs Lab 10/30/16 0941  WBC 8.6  HGB 13.1  HCT 37.4*  PLT 238   ------------------------------------------------------------------------------------------------------------------ Chemistries   Recent Labs Lab 10/30/16 0941 11/03/16 0548  NA 140  --   K 4.5  --   CL 107  --   CO2 27  --   GLUCOSE 186*  --   BUN 27*  --   CREATININE 0.98 0.93  CALCIUM 8.6*  --    ------------------------------------------------------------------------------------------------------------------   ASSESSMENT AND PLAN:   * Uncontrolled DM with hyperglycemia: present on admission Due to missing insulin, now much better controlled - continue SSI On lantus and pre meal insulin:Blood sugar overall labile but stable  * Dementia with behavioral disturbance Has legal gaurdian and now in IVC with sitter due to patient refusing to agitation Difficult situation for placement. - [facility] - [facility] aware and working on it. -Waiting for [facility] bed at [facility] - continue risperidone & ativan prn - Appreciate psychiatry input  * HTN - continue losartan and norvasc  *DVT prophylaxis with Lovenox   All the records are reviewed and case discussed with Care Management/Social Worker Management plans discussed with the  patient, nursing and they are in agreement.  CODE STATUS: FULL CODE  DVT Prophylaxis: SCDs  TOTAL TIME TAKING CARE OF THIS PATIENT: 20 minutes.    POSSIBLE D/C IN 1-2 DAYS, DEPENDING ON CLINICAL CONDITION. [facility]  Auburn Bilberry M.D on 11/04/2016 at 1:59 PM  Between 7am to 6pm - Pager - [phone]  After 6pm go to [email or web address] - password [email or web address] [facility]  SOUND  [facility]ospital[facility]  Office  [phone]  CC: Primary care physician; Marguarite Arbour, MD  Note: This dictation was prepared with Dragon dictation along with smaller phrase technology. Any transcriptional errors that result from this process are unintentional.

## 2016-11-05 LAB — GLUCOSE, CAPILLARY
GLUCOSE-CAPILLARY: 71 mg/dL (ref 65–99)
GLUCOSE-CAPILLARY: 126 mg/dL — AB (ref 65–99)
Glucose-Capillary: 80 mg/dL (ref 65–99)
Glucose-Capillary: 306 mg/dL — ABNORMAL HIGH (ref 65–99)

## 2016-11-05 LAB — CBC
HEMATOCRIT: 36.4 % — AB (ref 40.0–52.0)
Hemoglobin: 12.6 g/dL — ABNORMAL LOW (ref 13.0–18.0)
MCH: 30.8 pg (ref 26.0–34.0)
MCHC: 34.6 g/dL (ref 32.0–36.0)
MCV: 88.8 fL (ref 80.0–100.0)
PLATELETS: 254 10*3/uL (ref 150–440)
RBC: 4.09 MIL/uL — AB (ref 4.40–5.90)
RDW: 13.6 % (ref 11.5–14.5)
WBC: 8.4 10*3/uL (ref 3.8–10.6)

## 2016-11-05 LAB — CREATININE, SERUM: Creatinine, Ser: 1.02 mg/dL (ref 0.61–1.24)

## 2016-11-05 MED ORDER — INSULIN ASPART 100 UNIT/ML ~~LOC~~ SOLN
5.0000 [IU] | Freq: Three times a day (TID) | SUBCUTANEOUS | Status: DC
  Administered 2016-11-05 – 2016-11-07 (×5): 5 [IU] via SUBCUTANEOUS
  Filled 2016-11-05 (×5): qty 1

## 2016-11-05 NOTE — Clinical Social Work Note (Signed)
Patient still on wait list for [facility]. CSW spoke with [facility] and patient was denied at their [place] facility but his referral is being reviewed for their [facility] outside of [place]. [name] MSW,LCSW [phone]

## 2016-11-05 NOTE — Progress Notes (Signed)
Inpatient Diabetes Program Recommendations  AACE/ADA: New Consensus Statement on Inpatient Glycemic Control (2015)  Target Ranges:  Prepandial:   less than 140 mg/dL      Peak postprandial:   less than 180 mg/dL (1-2 hours)      Critically ill patients:  140 - 180 mg/dL   Results for Jesus Holland, Jesus Holland (MRN [id number]) as of 11/05/2016 12:48  Ref. Range 11/03/2016 08:13 11/03/2016 11:59 11/03/2016 16:58 11/03/2016 17:30 11/03/2016 21:29 11/03/2016 21:48 11/03/2016 22:11  Glucose-Capillary Latest Ref Range: 65 - 99 mg/dL 409 (H) 811 (H) 67 914 (H) 51 (L) 61 (L) 114 (H)   Results for Jesus Holland, Jesus Holland (MRN [id number]) as of 11/05/2016 12:48  Ref. Range 11/04/2016 08:31 11/04/2016 11:37 11/04/2016 12:10 11/04/2016 16:35 11/04/2016 21:18 11/04/2016 22:04  Glucose-Capillary Latest Ref Range: 65 - 99 mg/dL 81 62 (L) 98 086 (H) 66 152 (H)   Results for Jesus Holland, Jesus Holland (MRN [id number]) as of 11/05/2016 12:48  Ref. Range 11/05/2016 07:42 11/05/2016 11:45  Glucose-Capillary Latest Ref Range: 65 - 99 mg/dL 71 80    Home DM Meds: Lantus 30 units daily  Current Insulin Orders: Lantus 36 units daily      Novolog Moderate Correction Scale/ SSI (0-15 units) TID AC + HS      Novolog 8 units TID with meals     MD- Note patient having multiple episodes of Hypoglycemia since 09/22.  Please consider the following:  1. Reduce Lantus to 30 units daily (home dose)  2. Reduce Novolog Meal Coverage to: Novolog 6 units TID with meals (hold if pt eats <50% of meal)   3. Reduce Novolog SSI to Sensitive scale (0-9 units) TID AC + HS     --Will follow patient during hospitalization--  Ambrose Finland RN, MSN, CDE Diabetes Coordinator Inpatient Glycemic Control Team Team Pager: [phone] (8a-5p)

## 2016-11-05 NOTE — Clinical Social Work Note (Signed)
CSW contacted [facility] and sent updated information. They will review this information and stated that they have 5 Geri/Psych beds available so they will let me know. CSW confirmed that patient is still on waitlist at [facility]. [place] MSW,LCSW [phone]

## 2016-11-05 NOTE — Progress Notes (Signed)
Patient asked Lindsay to visit with him. CH met with pt, pt talked about his interpersonal relationship, loss of his wife, and also talked about his gods. Pt said he missed his dogs. Pt was delightful and talked about things that are important to him. Royal listened to pt and provided pastoral presence.   11/05/16 1600  Clinical Encounter Type  Visited With Patient;Health care provider  Visit Type Initial;Other (Comment)  Referral From [place];Other (Comment)

## 2016-11-05 NOTE — Progress Notes (Signed)
SOUND Physicians - [place] at [facility]   PATIENT NAME: Jesus Holland    MR#:  [id number]  DATE OF BIRTH:  May 16, 1928  SUBJECTIVE:  CHIEF COMPLAINT:   Chief Complaint  Patient presents with  . Psychiatric Evaluation  Patient walking around in the hallway no other complaints once again leave from here  REVIEW OF SYSTEMS:    Review of Systems  Constitutional: Negative for chills, fever and weight loss.  HENT: Negative for nosebleeds and sore throat.   Eyes: Negative for blurred vision.  Respiratory: Negative for cough, shortness of breath and wheezing.   Cardiovascular: Negative for chest pain, orthopnea, leg swelling and PND.  Gastrointestinal: Negative for abdominal pain, constipation, diarrhea, heartburn, nausea and vomiting.  Genitourinary: Negative for dysuria and urgency. Flank pain:   Musculoskeletal: Negative for back pain.  Skin: Negative for rash.  Neurological: Negative for dizziness, speech change, focal weakness and headaches.  Endo/Heme/Allergies: Does not bruise/bleed easily.  Psychiatric/Behavioral: Negative for depression.   DRUG ALLERGIES:   Allergies  Allergen Reactions  . Aspirin Nausea And Vomiting  . Escitalopram Other (See Comments)  . Metformin Other (See Comments)  . Quinine Nausea And Vomiting    VITALS:  Blood pressure 122/65, pulse 76, temperature 97.9 F (36.6 C), temperature source Oral, resp. rate 12, height  (1.727 m), weight 168 lb 6.4 oz (76.4 kg), SpO2 96 %.  PHYSICAL EXAMINATION:  Physical Exam  Constitutional: He is oriented to person, place, and time and well-developed, well-nourished, and in no distress.  HENT:  Head: Normocephalic and atraumatic.  Eyes: Pupils are equal, round, and reactive to light. Conjunctivae and EOM are normal.  Neck: Normal range of motion. Neck supple. No tracheal deviation present. No thyromegaly present.  Cardiovascular: Normal rate, regular rhythm and normal heart sounds.    Pulmonary/Chest: Effort normal and breath sounds normal. No respiratory distress. He has no wheezes. He exhibits no tenderness.  Abdominal: Soft. Bowel sounds are normal. He exhibits no distension. There is no tenderness.  Musculoskeletal: Normal range of motion.  Neurological: He is alert and oriented to person, place, and time. No cranial nerve deficit.  Skin: Skin is warm and dry. No rash noted.  Psychiatric: Mood normal. His affect is inappropriate. He expresses impulsivity. He is concerned with wish fulfillment.   LABORATORY PANEL:   CBC  Recent Labs Lab 11/05/16 0646  WBC 8.4  HGB 12.6*  HCT 36.4*  PLT 254   ------------------------------------------------------------------------------------------------------------------ Chemistries   Recent Labs Lab 10/30/16 0941  11/05/16 0646  NA 140  --   --   K 4.5  --   --   CL 107  --   --   CO2 27  --   --   GLUCOSE 186*  --   --   BUN 27*  --   --   CREATININE 0.98  < > 1.02  CALCIUM 8.6*  --   --   < > = values in this interval not displayed. ------------------------------------------------------------------------------------------------------------------   ASSESSMENT AND PLAN:   * Uncontrolled DM with hyperglycemia: present on admission Due to missing insulin, now much better controlled, patient having episodes of hypoglycemia - continue SSI On lantus , decrease dose pre meal insulin:Blood sugar overall labile but stable  * Dementia with behavioral disturbance Has legal gaurdian and now in IVC with sitter due to patient refusing to agitation Difficult situation for placement. - [facility] - [facility] aware and working on it. -[facility] - continue risperidone & ativan prn - Appreciate psychiatry input  * HTN - continue losartan and norvasc  *DVT prophylaxis with Lovenox   All the records are reviewed and case discussed with Care Management/Social  Worker Management plans discussed with the patient, nursing and they are in agreement.  CODE STATUS: FULL CODE  DVT Prophylaxis: SCDs  TOTAL TIME TAKING CARE OF THIS PATIENT: 20 minutes.   POSSIBLE D/C IN 1-2 DAYS, DEPENDING ON CLINICAL CONDITION. [facility]  Auburn Bilberry M.D on 11/05/2016 at 2:45 PM  Between 7am to 6pm - Pager - [phone]  After 6pm go to [email or web address] - password [email or web address] [facility]  [facility] [place] [facility]  Office  [phone]  CC: Primary care physician; Marguarite Arbour, MD  Note: This dictation was prepared with Dragon dictation along with smaller phrase technology. Any transcriptional errors that result from this process are unintentional.

## 2016-11-06 LAB — GLUCOSE, CAPILLARY
GLUCOSE-CAPILLARY: 50 mg/dL — AB (ref 65–99)
GLUCOSE-CAPILLARY: 96 mg/dL (ref 65–99)
GLUCOSE-CAPILLARY: 260 mg/dL — AB (ref 65–99)
Glucose-Capillary: 114 mg/dL — ABNORMAL HIGH (ref 65–99)
Glucose-Capillary: 156 mg/dL — ABNORMAL HIGH (ref 65–99)
Glucose-Capillary: 215 mg/dL — ABNORMAL HIGH (ref 65–99)

## 2016-11-06 MED ORDER — DOCUSATE SODIUM 100 MG PO CAPS
100.0000 mg | ORAL_CAPSULE | Freq: Two times a day (BID) | ORAL | Status: DC
  Administered 2016-11-06 – 2016-11-15 (×17): 100 mg via ORAL
  Filled 2016-11-06 (×20): qty 1

## 2016-11-06 NOTE — Progress Notes (Signed)
[facility]   PATIENT NAME: Jesus Holland    MR#:  [id number]  DATE OF BIRTH:  [birth year]  SUBJECTIVE:  CHIEF COMPLAINT:   Chief Complaint  Patient presents with  . Psychiatric Evaluation   No complaints   REVIEW OF SYSTEMS:    Review of Systems  Constitutional: Negative for chills, fever and weight loss.  HENT: Negative for nosebleeds and sore throat.   Eyes: Negative for blurred vision.  Respiratory: Negative for cough, shortness of breath and wheezing.   Cardiovascular: Negative for chest pain, orthopnea, leg swelling and PND.  Gastrointestinal: Negative for abdominal pain, constipation, diarrhea, heartburn, nausea and vomiting.  Genitourinary: Negative for dysuria and urgency. Flank pain:   Musculoskeletal: Negative for back pain.  Skin: Negative for rash.  Neurological: Negative for dizziness, speech change, focal weakness and headaches.  Endo/Heme/Allergies: Does not bruise/bleed easily.  Psychiatric/Behavioral: Negative for depression.   DRUG ALLERGIES:   Allergies  Allergen Reactions  . Aspirin Nausea And Vomiting  . Escitalopram Other (See Comments)  . Metformin Other (See Comments)  . Quinine Nausea And Vomiting    VITALS:  Blood pressure (!) 146/61, pulse 81, temperature 98.1 F (36.7 C), temperature source Oral, resp. rate 18, height  (1.727 m), weight 168 lb 6.4 oz (76.4 kg), SpO2 99 %.  PHYSICAL EXAMINATION:  Physical Exam  Constitutional: He is oriented to person, place, and time and well-developed, well-nourished, and in no distress.  HENT:  Head: Normocephalic and atraumatic.  Eyes: Pupils are equal, round, and reactive to light. Conjunctivae and EOM are normal.  Neck: Normal range of motion. Neck supple. No tracheal deviation present. No thyromegaly present.  Cardiovascular: Normal rate, regular rhythm and normal heart sounds.   Pulmonary/Chest: Effort normal and breath sounds normal. No respiratory  distress. He has no wheezes. He exhibits no tenderness.  Abdominal: Soft. Bowel sounds are normal. He exhibits no distension. There is no tenderness.  Musculoskeletal: Normal range of motion.  Neurological: He is alert and oriented to person, place, and time. No cranial nerve deficit.  Skin: Skin is warm and dry. No rash noted.  Psychiatric: Mood normal. His affect is inappropriate. He expresses impulsivity. He is concerned with wish fulfillment.   LABORATORY PANEL:   CBC  Recent Labs Lab 11/05/16 0646  WBC 8.4  HGB 12.6*  HCT 36.4*  PLT 254   ------------------------------------------------------------------------------------------------------------------ Chemistries   Recent Labs Lab 11/05/16 0646  CREATININE 1.02   ------------------------------------------------------------------------------------------------------------------   ASSESSMENT AND PLAN:   * Uncontrolled DM with hyperglycemia: present on admission Due to missing insulin, now much better controlled, patient having episodes of hypoglycemia - continue SSI On lantus , continue pre-meal insulin   * Dementia with behavioral disturbance Has legal gaurdian and now in IVC with sitter due to patient refusing to agitation Difficult situation for placement. - waiting for placement , SW aware and working on it. -Waiting for Geropsych bed  - continue risperidone & ativan prn - Appreciate psychiatry input  * HTN - continue losartan and norvasc  *DVT prophylaxis with Lovenox   All the records are reviewed and case discussed with Care Management/Social Worker Management plans discussed with the patient, nursing and they are in agreement.  CODE STATUS: FULL CODE  DVT Prophylaxis: SCDs  TOTAL TIME TAKING CARE OF THIS PATIENT: 20 minutes.   POSSIBLE D/C IN 1-2 DAYS, DEPENDING ON CLINICAL CONDITION. [facility]  Auburn Bilberry M.D on 11/06/2016 at 2:51 PM  Between 7am to 6pm - Pager -  [phone]  After 6pm go to [email or web address] - password [email or web address] [facility]  [facility]  Office  [phone]  CC: Primary care physician; Marguarite Arbour, MD  Note: This dictation was prepared with Dragon dictation along with smaller phrase technology. Any transcriptional errors that result from this process are unintentional.

## 2016-11-06 NOTE — Clinical Social Work Note (Signed)
CSW has left message for [employer] Guardian, Marylene Land (who apparently left sick from work today) and her supervisor, Jenel Lucks, this afternoon to see what progress they have made. CSW contacted [employer] and spoke with Joni Reining who stated that they were still reviewing patient's information. [name] MSW,LCSW [phone]

## 2016-11-06 NOTE — Clinical Social Work Note (Signed)
Intake at [facility] called this CSW about 7pm last evening and was asking for latest vitals and blood sugars. When CSW asked if this meant that they will have a bed for patient, CSW was informed that more than likely they will have a bed but it will not be until 9/25. When CSW called the facility this morning, CSW was told that their MD was not yet in and that they would review and give CSW a call. [name] MSW,LCSW [phone]

## 2016-11-06 NOTE — Consult Note (Signed)
[facility] Consult   Reason for Consult:  Consult for 83 year old man who was brought back to the hospital almost immediately after discharge because of his agitated behavior that prevented placement. Referring Physician:  Sudini Patient Identification: Courtland Reas MRN:  [id number] Principal Diagnosis: Psychosis Diagnosis:   Patient Active Problem List   Diagnosis Date Noted  . Psychosis [F29] 10/29/2016  . Hyperglycemia [R73.9] 10/23/2016  . Dementia, Alzheimer's, with behavior disturbance [G30.9, F02.81] 10/23/2016    Total Time spent with patient: 20 minutes  Subjective:   Malcome Ambrocio is a 83 y.o. male patient admitted with "get out of here! I want nothing to do with you! 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  This is a follow-up for this 83 year old man with a history of dementia and inappropriate behavior. I saw the patient this evening and he was unusually pleasant to me. Not sure if he really then remembered our previous interactions. Patient seemed to be very vague in his understanding of his current situation. He is saying now that he is looking [facility] but I'm not sure he understands exactly what that means. Situation reviewed with social work. Apparently [facility] may have a bed available within a couple weeks.  HPI:  Patient interviewed chart reviewed. I saw this gentleman last week in the hospital as well. Last time he was here in the hospital he had presented with a very elevated blood sugar and with agitated oppositional behavior when the Department of Social Services tried to place him at the [place]. My evaluation last week was that this was a patient with dementia probably mixed vascular and Alzheimer's with psychotic symptoms and behavioral disturbance. I had taken him off commitment and suggested he be discharged and placed. Apparently this was attempted and once again the patient immediately became physically  aggressive and disruptive to the point that it was impossible to place him and once again he came back to the hospital with a blood sugar of almost 700. I tried to speak with him this evening and the patient became agitated almost immediately upon seeing me. I tried to engage him in some conversation about his medical history and he just escalated more and more. Refused to listen to any reason from me or from any of the nursing aides patient appeared to be very confused at some points. 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 This appeared to refer to a conversation that he and the nurse had been having about a piece of music. He was able to have brief bits of conversation that were lucid but then became emotionally very ramped up and I could not get him to calm down.  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 I have not spoken to any family and I'm not aware of any are currently involved. Patient is insisting that he will go back to his own home although it has been explained to him repeatedly that he is no longer able to make decisions for himself.  Medical history: Diabetes. Tried to get him to engage in some conversation about it but he gets so agitated I can't even tell how much he understands his illness.  Substance abuse history: Nonidentified  Past Psychiatric History: Patient does not  have any known past psychiatric history that I have been able to identify although after interacting with him today and speaking with the hospitalist I am wondering more as to whether he might have an underlying bipolar disorder or other kind of mental illness since his behavior and presentation seems atypical for simply being demented.  Risk to Self: Is patient at risk for suicide?: No Risk to Others:   Prior  Inpatient Therapy:   Prior Outpatient Therapy:    Past Medical History:  Past Medical History:  Diagnosis Date  . Arthritis   . Diabetes mellitus without complication [facility])     Past Surgical History:  Procedure Laterality Date  . none     Family History:  Family History  Problem Relation Age of Onset  . Family history unknown: Yes   Family Psychiatric  History: Unknown Social History:  History  Alcohol Use No     History  Drug Use No    Social History   Social History  . Marital status: Single    Spouse name: N/A  . Number of children: N/A  . Years of education: N/A   Occupational History  . retired    Social History Main Topics  . Smoking status: Never Smoker  . Smokeless tobacco: Never Used  . Alcohol use No  . Drug use: No  . Sexual activity: Not Asked   Other Topics Concern  . None   Social History Narrative  . None   Additional Social History:    Allergies:   Allergies  Allergen Reactions  . Aspirin Nausea And Vomiting  . Escitalopram Other (See Comments)  . Metformin Other (See Comments)  . Quinine Nausea And Vomiting    Labs:  Results for orders placed or performed during the hospital encounter of 10/26/16 (from the past 48 hour(s))  Glucose, capillary     Status: None   Collection Time: 11/04/16  9:18 PM  Result Value Ref Range   Glucose-Capillary 66 65 - 99 mg/dL  Glucose, capillary     Status: Abnormal   Collection Time: 11/04/16 10:04 PM  Result Value Ref Range   Glucose-Capillary 152 (H) 65 - 99 mg/dL  CBC     Status: Abnormal   Collection Time: 11/05/16  6:46 AM  Result Value Ref Range   WBC 8.4 3.8 - 10.6 K/uL   RBC 4.09 (L) 4.40 - 5.90 MIL/uL   Hemoglobin 12.6 (L) 13.0 - 18.0 g/dL   HCT 36.4 (L) 40.0 - 52.0 %   MCV 88.8 80.0 - 100.0 fL   MCH 30.8 26.0 - 34.0 pg   MCHC 34.6 32.0 - 36.0 g/dL   RDW 13.6 11.5 - 14.5 %   Platelets 254 150 - 440 K/uL  Creatinine, serum     Status: None   Collection Time: 11/05/16  6:46 AM   Result Value Ref Range   Creatinine, Ser 1.02 0.61 - 1.24 mg/dL   GFR calc non Af Amer >60 >60 mL/min   GFR calc Af Amer >60 >60 mL/min    Comment: (NOTE) The eGFR has been calculated using the CKD EPI equation. This calculation has not been validated in all clinical situations. eGFR's persistently <60 mL/min signify possible Chronic Kidney Disease.   Glucose, capillary     Status: None   Collection Time: 11/05/16  7:42 AM  Result Value Ref Range   Glucose-Capillary 71 65 - 99 mg/dL  Glucose, capillary     Status: None   Collection Time: 11/05/16  11:45 AM  Result Value Ref Range   Glucose-Capillary 80 65 - 99 mg/dL   Comment 1 Notify RN   Glucose, capillary     Status: Abnormal   Collection Time: 11/05/16  4:50 PM  Result Value Ref Range   Glucose-Capillary 126 (H) 65 - 99 mg/dL   Comment 1 Notify RN   Glucose, capillary     Status: Abnormal   Collection Time: 11/05/16  9:33 PM  Result Value Ref Range   Glucose-Capillary 306 (H) 65 - 99 mg/dL   Comment 1 Notify RN   Glucose, capillary     Status: Abnormal   Collection Time: 11/06/16 12:38 AM  Result Value Ref Range   Glucose-Capillary 215 (H) 65 - 99 mg/dL   Comment 1 Notify RN   Glucose, capillary     Status: Abnormal   Collection Time: 11/06/16  7:55 AM  Result Value Ref Range   Glucose-Capillary 114 (H) 65 - 99 mg/dL  Glucose, capillary     Status: Abnormal   Collection Time: 11/06/16 11:58 AM  Result Value Ref Range   Glucose-Capillary 156 (H) 65 - 99 mg/dL  Glucose, capillary     Status: Abnormal   Collection Time: 11/06/16  4:19 PM  Result Value Ref Range   Glucose-Capillary 50 (L) 65 - 99 mg/dL  Glucose, capillary     Status: None   Collection Time: 11/06/16  4:45 PM  Result Value Ref Range   Glucose-Capillary 96 65 - 99 mg/dL    Current Facility-Administered Medications  Medication Dose Route Frequency Provider Last Rate Last Dose  . acetaminophen (TYLENOL) tablet 650 mg  650 mg Oral Q6H PRN Saundra Shelling, MD   650 mg at 11/05/16 2000   Or  . acetaminophen (TYLENOL) suppository 650 mg  650 mg Rectal Q6H PRN Saundra Shelling, MD      . amLODipine (NORVASC) tablet 2.5 mg  2.5 mg Oral Daily Pyreddy, Pavan, MD   2.5 mg at 11/06/16 0952  . docusate sodium (COLACE) capsule 100 mg  100 mg Oral BID Dustin Flock, MD      . enoxaparin (LOVENOX) injection 40 mg  40 mg Subcutaneous Q24H Saundra Shelling, MD   40 mg at 11/06/16 0950  . fluticasone (FLONASE) 50 MCG/ACT nasal spray 2 spray  2 spray Each Nare QHS Idelle Crouch, MD   2 spray at 11/05/16 2141  . HYDROcodone-acetaminophen (NORCO) 10-325 MG per tablet 1 tablet  1 tablet Oral Q6H PRN Saundra Shelling, MD   1 tablet at 11/06/16 1249  . insulin aspart (novoLOG) injection 0-15 Units  0-15 Units Subcutaneous TID WC Saundra Shelling, MD   3 Units at 11/06/16 1249  . insulin aspart (novoLOG) injection 0-5 Units  0-5 Units Subcutaneous QHS Saundra Shelling, MD   4 Units at 11/05/16 2140  . insulin aspart (novoLOG) injection 5 Units  5 Units Subcutaneous TID WC Dustin Flock, MD   5 Units at 11/06/16 1249  . insulin glargine (LANTUS) injection 36 Units  36 Units Subcutaneous Daily Hillary Bow, MD   36 Units at 11/06/16 [phone]  . LORazepam (ATIVAN) tablet 0.5 mg  0.5 mg Oral Q8H PRN Saundra Shelling, MD   0.5 mg at 11/04/16 0031  . losartan (COZAAR) tablet 100 mg  100 mg Oral Daily Pyreddy, Reatha Harps, MD   100 mg at 11/06/16 0951  . ondansetron (ZOFRAN) tablet 4 mg  4 mg Oral Q6H PRN Saundra Shelling, MD   4 mg at 10/29/16 2048  Or  . ondansetron (ZOFRAN) injection 4 mg  4 mg Intravenous Q6H PRN Pyreddy, Pavan, MD      . risperiDONE (RISPERDAL M-TABS) disintegrating tablet 1 mg  1 mg Oral TID Craig Wisnewski, Madie Reno, MD   1 mg at 11/06/16 1616  . senna-docusate (Senokot-S) tablet 1 tablet  1 tablet Oral QHS PRN Saundra Shelling, MD        Musculoskeletal: Strength & Muscle Tone: within normal limits Gait & Station: unsteady Patient leans: N/A  Psychiatric Specialty  Exam: Physical Exam  Nursing note and vitals reviewed. Constitutional: He appears well-developed and well-nourished.  HENT:  Head: Normocephalic and atraumatic.  Eyes: Pupils are equal, round, and reactive to light. Conjunctivae are normal.  Neck: Normal range of motion.  Cardiovascular: Regular rhythm and normal heart sounds.   Respiratory: Effort normal. No respiratory distress.  GI: Soft.  Musculoskeletal: Normal range of motion.  Neurological: He is alert.  Skin: Skin is warm and dry.  Psychiatric: His affect is not labile and not inappropriate. His speech is tangential. His speech is not rapid and/or pressured. Thought content is paranoid. Cognition and memory are impaired. He expresses impulsivity and inappropriate judgment. He expresses no homicidal and no suicidal ideation. He exhibits abnormal recent memory.    Review of Systems  Constitutional: Negative.   HENT: Negative.   Eyes: Negative.   Respiratory: Negative.   Cardiovascular: Negative.   Gastrointestinal: Negative.   Musculoskeletal: Negative.   Skin: Negative.   Neurological: Negative.   Psychiatric/Behavioral: Negative for depression, hallucinations, memory loss, substance abuse and suicidal ideas. The patient is not nervous/anxious and does not have insomnia.    [facility] Consult   Reason for Consult:  Consult for 83 year old man who was brought back to the hospital almost immediately after discharge because of his agitated behavior that prevented placement. Referring Physician:  Sudini Patient Identification: Asiel Chrostowski MRN:  [id number] Principal Diagnosis: Psychosis Diagnosis:   Patient Active Problem List   Diagnosis Date Noted  . Psychosis [F29] 10/29/2016  . Hyperglycemia [R73.9] 10/23/2016  . Dementia, Alzheimer's, with behavior disturbance [G30.9, F02.81] 10/23/2016    Total Time spent with patient: 1 hour  Subjective:   Layn Kye is a 83 y.o. male patient admitted with  "get out of here! I want nothing to do with you! 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  HPI:  Patient interviewed chart reviewed. I saw this gentleman last week in the hospital as well. Last time he was here in the hospital he had presented with a very elevated blood sugar and with agitated oppositional behavior when the Department of Social Services tried to place him at the [place]. My evaluation last week was that this was a patient with dementia probably mixed vascular and Alzheimer's with psychotic symptoms and behavioral disturbance. I had taken him off commitment and suggested he be discharged and placed. Apparently this was attempted and once again the patient immediately became physically aggressive and disruptive to the point that it was impossible to place him and once again he came back to the hospital with a blood sugar of almost 700. I tried to speak with him this evening and the patient became agitated almost immediately upon seeing me. I tried to engage him in some conversation about his medical history and he just escalated more and more. Refused to listen to any reason from me or from any of the nursing aides patient appeared to be very confused at some points. He insisted that  he was in a jail and that he was here to teach music to the daughter of one of the nurses. This appeared to refer to a conversation that he and the nurse had been having about a piece of music. He was able to have brief bits of conversation that were lucid but then became emotionally very ramped up and I could not get him to calm down.  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 I have not spoken to any family and I'm not aware of any are currently involved. Patient is insisting that he will go back to his own home although it has been explained to him repeatedly that he is no longer able to  make decisions for himself.  Medical history: Diabetes. Tried to get him to engage in some conversation about it but he gets so agitated I can't even tell how much he understands his illness.  Substance abuse history: Nonidentified  Past Psychiatric History: Patient does not have any known past psychiatric history that I have been able to identify although after interacting with him today and speaking with the hospitalist I am wondering more as to whether he might have an underlying bipolar disorder or other kind of mental illness since his behavior and presentation seems atypical for simply being demented.  Risk to Self: Is patient at risk for suicide?: No Risk to Others:   Prior Inpatient Therapy:   Prior Outpatient Therapy:    Past Medical History:  Past Medical History:  Diagnosis Date  . Arthritis   . Diabetes mellitus without complication [facility])     Past Surgical History:  Procedure Laterality Date  . none     Family History:  Family History  Problem Relation Age of Onset  . Family history unknown: Yes   Family Psychiatric  History: Unknown Social History:  History  Alcohol Use No     History  Drug Use No    Social History   Social History  . Marital status: Single    Spouse name: N/A  . Number of children: N/A  . Years of education: N/A   Occupational History  . retired    Social History Main Topics  . Smoking status: Never Smoker  . Smokeless tobacco: Never Used  . Alcohol use No  . Drug use: No  . Sexual activity: Not Asked   Other Topics Concern  . None   Social History Narrative  . None   Additional Social History:    Allergies:   Allergies  Allergen Reactions  . Aspirin Nausea And Vomiting  . Escitalopram Other (See Comments)  . Metformin Other (See Comments)  . Quinine Nausea And Vomiting    Labs:  Results for orders placed or performed during the hospital encounter of 10/26/16 (from the past 48 hour(s))  Glucose, capillary      Status: None   Collection Time: 11/04/16  9:18 PM  Result Value Ref Range   Glucose-Capillary 66 65 - 99 mg/dL  Glucose, capillary     Status: Abnormal   Collection Time: 11/04/16 10:04 PM  Result Value Ref Range   Glucose-Capillary 152 (H) 65 - 99 mg/dL  CBC     Status: Abnormal   Collection Time: 11/05/16  6:46 AM  Result Value Ref Range   WBC 8.4 3.8 - 10.6 K/uL   RBC 4.09 (L) 4.40 -  5.90 MIL/uL   Hemoglobin 12.6 (L) 13.0 - 18.0 g/dL   HCT 36.4 (L) 40.0 - 52.0 %   MCV 88.8 80.0 - 100.0 fL   MCH 30.8 26.0 - 34.0 pg   MCHC 34.6 32.0 - 36.0 g/dL   RDW 13.6 11.5 - 14.5 %   Platelets 254 150 - 440 K/uL  Creatinine, serum     Status: None   Collection Time: 11/05/16  6:46 AM  Result Value Ref Range   Creatinine, Ser 1.02 0.61 - 1.24 mg/dL   GFR calc non Af Amer >60 >60 mL/min   GFR calc Af Amer >60 >60 mL/min    Comment: (NOTE) The eGFR has been calculated using the CKD EPI equation. This calculation has not been validated in all clinical situations. eGFR's persistently <60 mL/min signify possible Chronic Kidney Disease.   Glucose, capillary     Status: None   Collection Time: 11/05/16  7:42 AM  Result Value Ref Range   Glucose-Capillary 71 65 - 99 mg/dL  Glucose, capillary     Status: None   Collection Time: 11/05/16 11:45 AM  Result Value Ref Range   Glucose-Capillary 80 65 - 99 mg/dL   Comment 1 Notify RN   Glucose, capillary     Status: Abnormal   Collection Time: 11/05/16  4:50 PM  Result Value Ref Range   Glucose-Capillary 126 (H) 65 - 99 mg/dL   Comment 1 Notify RN   Glucose, capillary     Status: Abnormal   Collection Time: 11/05/16  9:33 PM  Result Value Ref Range   Glucose-Capillary 306 (H) 65 - 99 mg/dL   Comment 1 Notify RN   Glucose, capillary     Status: Abnormal   Collection Time: 11/06/16 12:38 AM  Result Value Ref Range   Glucose-Capillary 215 (H) 65 - 99 mg/dL   Comment 1 Notify RN   Glucose, capillary     Status: Abnormal   Collection Time:  11/06/16  7:55 AM  Result Value Ref Range   Glucose-Capillary 114 (H) 65 - 99 mg/dL  Glucose, capillary     Status: Abnormal   Collection Time: 11/06/16 11:58 AM  Result Value Ref Range   Glucose-Capillary 156 (H) 65 - 99 mg/dL  Glucose, capillary     Status: Abnormal   Collection Time: 11/06/16  4:19 PM  Result Value Ref Range   Glucose-Capillary 50 (L) 65 - 99 mg/dL  Glucose, capillary     Status: None   Collection Time: 11/06/16  4:45 PM  Result Value Ref Range   Glucose-Capillary 96 65 - 99 mg/dL    Current Facility-Administered Medications  Medication Dose Route Frequency Provider Last Rate Last Dose  . acetaminophen (TYLENOL) tablet 650 mg  650 mg Oral Q6H PRN Saundra Shelling, MD   650 mg at 11/05/16 2000   Or  . acetaminophen (TYLENOL) suppository 650 mg  650 mg Rectal Q6H PRN Saundra Shelling, MD      . amLODipine (NORVASC) tablet 2.5 mg  2.5 mg Oral Daily Pyreddy, Pavan, MD   2.5 mg at 11/06/16 0952  . docusate sodium (COLACE) capsule 100 mg  100 mg Oral BID Dustin Flock, MD      . enoxaparin (LOVENOX) injection 40 mg  40 mg Subcutaneous Q24H Saundra Shelling, MD   40 mg at 11/06/16 0950  . fluticasone (FLONASE) 50 MCG/ACT nasal spray 2 spray  2 spray Each Nare QHS Idelle Crouch, MD   2 spray at 11/05/16 2141  .  HYDROcodone-acetaminophen (NORCO) 10-325 MG per tablet 1 tablet  1 tablet Oral Q6H PRN Saundra Shelling, MD   1 tablet at 11/06/16 1249  . insulin aspart (novoLOG) injection 0-15 Units  0-15 Units Subcutaneous TID WC Saundra Shelling, MD   3 Units at 11/06/16 1249  . insulin aspart (novoLOG) injection 0-5 Units  0-5 Units Subcutaneous QHS Saundra Shelling, MD   4 Units at 11/05/16 2140  . insulin aspart (novoLOG) injection 5 Units  5 Units Subcutaneous TID WC Dustin Flock, MD   5 Units at 11/06/16 1249  . insulin glargine (LANTUS) injection 36 Units  36 Units Subcutaneous Daily Hillary Bow, MD   36 Units at 11/06/16 [phone]  . LORazepam (ATIVAN) tablet 0.5 mg  0.5 mg  Oral Q8H PRN Saundra Shelling, MD   0.5 mg at 11/04/16 0031  . losartan (COZAAR) tablet 100 mg  100 mg Oral Daily Pyreddy, Reatha Harps, MD   100 mg at 11/06/16 0951  . ondansetron (ZOFRAN) tablet 4 mg  4 mg Oral Q6H PRN Saundra Shelling, MD   4 mg at 10/29/16 2048   Or  . ondansetron (ZOFRAN) injection 4 mg  4 mg Intravenous Q6H PRN Pyreddy, Reatha Harps, MD      . risperiDONE (RISPERDAL M-TABS) disintegrating tablet 1 mg  1 mg Oral TID Demetric Dunnaway, Madie Reno, MD   1 mg at 11/06/16 1616  . senna-docusate (Senokot-S) tablet 1 tablet  1 tablet Oral QHS PRN Saundra Shelling, MD        Musculoskeletal: Strength & Muscle Tone: within normal limits Gait & Station: unsteady Patient leans: N/A  Psychiatric Specialty Exam: Physical Exam  Nursing note and vitals reviewed. Constitutional: He appears well-developed and well-nourished.  HENT:  Head: Normocephalic and atraumatic.  Eyes: Pupils are equal, round, and reactive to light. Conjunctivae are normal.  Neck: Normal range of motion.  Cardiovascular: Regular rhythm and normal heart sounds.   Respiratory: Effort normal. No respiratory distress.  GI: Soft.  Musculoskeletal: Normal range of motion.  Neurological: He is alert.  Skin: Skin is warm and dry.  Psychiatric: His affect is labile and inappropriate. His speech is rapid and/or pressured and tangential. He is agitated. Thought content is paranoid. Cognition and memory are impaired. He expresses impulsivity and inappropriate judgment. He expresses no homicidal and no suicidal ideation. He exhibits abnormal recent memory.    Review of Systems  Constitutional: Negative.   HENT: Negative.   Eyes: Negative.   Respiratory: Negative.   Cardiovascular: Negative.   Gastrointestinal: Negative.   Musculoskeletal: Negative.   Skin: Negative.   Neurological: Negative.   Psychiatric/Behavioral: Negative for depression, hallucinations, memory loss, substance abuse and suicidal ideas. The patient is not nervous/anxious and  does not have insomnia.     Blood pressure 135/69, pulse 79, temperature 98 F (36.7 C), temperature source Oral, resp. rate 18, height 5' 8"  (1.727 m), weight 76.4 kg (168 lb 6.4 oz), SpO2 98 %.Body mass index is 25.61 kg/m.  General Appearance: Casual  Eye Contact:  Fair  Speech:  Pressured  Volume:  Increased  Mood:  Angry  Affect:  Congruent and Inappropriate  Thought Process:  Disorganized  Orientation:  Negative  Thought Content:  Illogical, Paranoid Ideation and Tangential  Suicidal Thoughts:  No  Homicidal Thoughts:  No  Memory:  Negative  Judgement:  Poor  Insight:  Lacking  Psychomotor Activity:  Restlessness  Concentration:  Concentration: Poor  Recall:  Poor  Fund of Knowledge:  Poor  Language:  Fair  Akathisia:  No  Handed:  Right  AIMS (if indicated):     Assets:  Communication Skills  ADL's:  Impaired  Cognition:  Impaired,  Mild  Sleep:        Treatment Plan Summary: Daily contact with patient to assess and evaluate symptoms and progress in treatment, Medication management and Plan At this point I am more convinced that the patient probably does need psychiatric treatment. His behavior is irrational and not showing any signs of improving. If anything it is getting worse. I agreed with the hospitalist that the patient probably should be started on some medicine and have started risperidone. I started with a low dose but if he is not oversedated we can try to titrate that up. Under the current circumstances I can't see how we can discontinue the IVC given the failure to place him twice in a row. I appreciate social work's attempts to refer him to [facility] which I think is probably the best course for now. I will try to follow up as best I can.  Disposition: Recommend psychiatric Inpatient admission when medically cleared. Supportive therapy provided about ongoing stressors.  Alethia Berthold, MD 11/06/2016 8:06 PM  Blood pressure 135/69, pulse  79, temperature 98 F (36.7 C), temperature source Oral, resp. rate 18, height 5' 8"  (1.727 m), weight 76.4 kg (168 lb 6.4 oz), SpO2 98 %.Body mass index is 25.61 kg/m.  General Appearance: Casual  Eye Contact:  Fair  Speech:  Pressured  Volume:  Increased  Mood:  Angry  Affect:  Congruent and Inappropriate  Thought Process:  Disorganized  Orientation:  Negative  Thought Content:  Illogical, Paranoid Ideation and Tangential  Suicidal Thoughts:  No  Homicidal Thoughts:  No  Memory:  Negative  Judgement:  Poor  Insight:  Lacking  Psychomotor Activity:  Restlessness  Concentration:  Concentration: Poor  Recall:  Poor  Fund of Knowledge:  Poor  Language:  Fair  Akathisia:  No  Handed:  Right  AIMS (if indicated):     Assets:  Communication Skills  ADL's:  Impaired  Cognition:  Impaired,  Mild  Sleep:        Treatment Plan Summary: Daily contact with patient to assess and evaluate symptoms and progress in treatment, Medication management and Plan Patient has been cooperative and has been taking modest doses of antipsychotics which I think have improved his mood and irritability. He seems to be tolerating them without significant side effects. Still high risk for acting out or uncooperative behavior if placed against his will. Recommend that we continue the IVC at this point and try to get him to a psychiatric hospitalization for best stabilization before considering discharge.  Disposition: Recommend psychiatric Inpatient admission when medically cleared. Supportive therapy provided about ongoing stressors.  Alethia Berthold, MD 11/06/2016 8:06 PM

## 2016-11-07 LAB — GLUCOSE, CAPILLARY
GLUCOSE-CAPILLARY: 125 mg/dL — AB (ref 65–99)
GLUCOSE-CAPILLARY: 156 mg/dL — AB (ref 65–99)
Glucose-Capillary: 192 mg/dL — ABNORMAL HIGH (ref 65–99)
Glucose-Capillary: 269 mg/dL — ABNORMAL HIGH (ref 65–99)

## 2016-11-07 NOTE — Clinical Social Work Note (Addendum)
Patient remains on waitlist for [facility]. CSW spoke with Jenel Lucks, DSS Guardianship Supervisor, this morning and she informed CSW that they got approval to pay for patient to return to [facility] with nursing aids and caregivers in place. Kylie and Marylene Land are working to get this in place. Patient is not to be told this information at this time as it is not finalized. [name] MSW,LCSW [phone]

## 2016-11-07 NOTE — Progress Notes (Signed)
[facility] - [place] at [facility]   PATIENT NAME: Jesus Holland    MR#:  [id number]  DATE OF BIRTH:  February 02, 1929  SUBJECTIVE:  CHIEF COMPLAINT:   Chief Complaint  Patient presents with  . Psychiatric Evaluation   Patient wants to go home   REVIEW OF SYSTEMS:    Review of Systems  Constitutional: Negative for chills, fever and weight loss.  HENT: Negative for nosebleeds and sore throat.   Eyes: Negative for blurred vision.  Respiratory: Negative for cough, shortness of breath and wheezing.   Cardiovascular: Negative for chest pain, orthopnea, leg swelling and PND.  Gastrointestinal: Negative for abdominal pain, constipation, diarrhea, heartburn, nausea and vomiting.  Genitourinary: Negative for dysuria and urgency. Flank pain:   Musculoskeletal: Negative for back pain.  Skin: Negative for rash.  Neurological: Negative for dizziness, speech change, focal weakness and headaches.  Endo/Heme/Allergies: Does not bruise/bleed easily.  Psychiatric/Behavioral: Negative for depression.   DRUG ALLERGIES:   Allergies  Allergen Reactions  . Aspirin Nausea And Vomiting  . Escitalopram Other (See Comments)  . Metformin Other (See Comments)  . Quinine Nausea And Vomiting    VITALS:  Blood pressure (!) 151/73, pulse 92, temperature 98.2 F (36.8 C), temperature source Oral, resp. rate 16, height  (1.727 m), weight 168 lb 6.4 oz (76.4 kg), SpO2 99 %.  PHYSICAL EXAMINATION:  Physical Exam  Constitutional: He is oriented to person, place, and time and well-developed, well-nourished, and in no distress.  HENT:  Head: Normocephalic and atraumatic.  Eyes: Pupils are equal, round, and reactive to light. Conjunctivae and EOM are normal.  Neck: Normal range of motion. Neck supple. No tracheal deviation present. No thyromegaly present.  Cardiovascular: Normal rate, regular rhythm and normal heart sounds.   Pulmonary/Chest: Effort normal and breath sounds normal. No  respiratory distress. He has no wheezes. He exhibits no tenderness.  Abdominal: Soft. Bowel sounds are normal. He exhibits no distension. There is no tenderness.  Musculoskeletal: Normal range of motion.  Neurological: He is alert and oriented to person, place, and time. No cranial nerve deficit.  Skin: Skin is warm and dry. No rash noted.  Psychiatric: Mood normal. His affect is inappropriate. He expresses impulsivity. He is concerned with wish fulfillment.   LABORATORY PANEL:   CBC  Recent Labs Lab 11/05/16 0646  WBC 8.4  HGB 12.6*  HCT 36.4*  PLT 254   ------------------------------------------------------------------------------------------------------------------ Chemistries   Recent Labs Lab 11/05/16 0646  CREATININE 1.02   ------------------------------------------------------------------------------------------------------------------   ASSESSMENT AND PLAN:   * Uncontrolled DM with hyperglycemia: present on admission Due to missing insulin,  Due to patient's labile eating habit blood sugars dropping Also there may be arrangement for patient to go independent living where he will not be able to take pre-meal insulin I will stop Continue Lantus   * Dementia with behavioral disturbance Has legal gaurdian and now in IVC with sitter due to patient refusing to agitation Difficult situation for placement. - waiting for placement , SW aware and working on it. -Waiting for Geropsych bed  - continue risperidone & ativan prn - Appreciate psychiatry input  * HTN - continue losartan and norvasc  *DVT prophylaxis with Lovenox   All the records are reviewed and case discussed with Care Management/Social Worker Management plans discussed with the patient, nursing and they are in agreement.  CODE STATUS: FULL CODE  DVT Prophylaxis: SCDs  TOTAL TIME TAKING CARE OF THIS PATIENT: 20 minutes.   POSSIBLE D/C IN  1-2 DAYS, DEPENDING ON CLINICAL CONDITION. [facility]  Auburn Bilberry M.D on 11/07/2016 at 1:34 PM  Between 7am to 6pm - Pager - [phone]  After 6pm go to [email or web address] - password [email or web address] [facility]  [facility]  Office  [phone]  CC: Primary care physician; Marguarite Arbour, MD  Note: This dictation was prepared with Dragon dictation along with smaller phrase technology. Any transcriptional errors that result from this process are unintentional.

## 2016-11-07 NOTE — Progress Notes (Addendum)
Inpatient Diabetes Program Recommendations  AACE/ADA: New Consensus Statement on Inpatient Glycemic Control (2015)  Target Ranges:  Prepandial:   less than 140 mg/dL      Peak postprandial:   less than 180 mg/dL (1-2 hours)      Critically ill patients:  140 - 180 mg/dL   Results for Jesus Holland, Jesus Holland (MRN [id number]) as of 11/07/2016 09:21  Ref. Range 11/06/2016 07:55 11/06/2016 11:58 11/06/2016 16:19 11/06/2016 16:45 11/06/2016 21:26  Glucose-Capillary Latest Ref Range: 65 - 99 mg/dL 409 (H) 811 (H) 50 (L) 96 260 (H)    Home DM Meds: Lantus 30 units daily  Current Insulin Orders: Lantus 36 units daily                                       Novolog Moderate Correction Scale/ SSI (0-15 units) TID AC + HS                                       Novolog 5 units TID with meals     MD- Note patient had Hypoglycemia yesterday at 4pm after receiving 8 units Novolog at 12pm (3 units SSI + 5 units Meal Coverage).  Please consider the following:  1. Reduce Novolog Meal Coverage to: Novolog 4 units TID with meals (hold if pt eats <50% of meal)   2. Reduce Novolog SSI to Sensitive scale (0-9 units) TID AC + HS      --Will follow patient during hospitalization--  Ambrose Finland RN, MSN, CDE Diabetes Coordinator Inpatient Glycemic Control Team Team Pager: [phone] (8a-5p)

## 2016-11-08 LAB — GLUCOSE, CAPILLARY
GLUCOSE-CAPILLARY: 132 mg/dL — AB (ref 65–99)
Glucose-Capillary: 297 mg/dL — ABNORMAL HIGH (ref 65–99)
Glucose-Capillary: 133 mg/dL — ABNORMAL HIGH (ref 65–99)
Glucose-Capillary: 184 mg/dL — ABNORMAL HIGH (ref 65–99)

## 2016-11-08 MED ORDER — INSULIN GLARGINE 100 UNIT/ML ~~LOC~~ SOLN
40.0000 [IU] | Freq: Every day | SUBCUTANEOUS | Status: DC
  Administered 2016-11-08 – 2016-11-11 (×4): 40 [IU] via SUBCUTANEOUS
  Filled 2016-11-08 (×4): qty 0.4

## 2016-11-08 NOTE — Consult Note (Signed)
[facility] Face-to-Face Psychiatry Consult   Reason for Consult:  Consult for 83 year old man who was brought back to the hospital almost immediately after discharge because of his agitated behavior that prevented placement. Referring Physician:  Sudini Patient Identification: Mckyle Solanki MRN:  [id number] Principal Diagnosis: Psychosis [facility]) Diagnosis:   Patient Active Problem List   Diagnosis Date Noted  . Psychosis [F29] 10/29/2016  . Hyperglycemia [R73.9] 10/23/2016  . Dementia, Alzheimer's, with behavior disturbance [G30.9, F02.81] 10/23/2016    Total Time spent with patient: 15 minutes  Subjective:   Andranik Jeune is a 83 y.o. male patient admitted with "get out of here! I want nothing to do with you! 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  This is a follow-up for this 83 year old man with a history of dementia and inappropriate behavior. I saw the patient this evening and he was unusually pleasant to me. Not sure if he really then remembered our previous interactions. Patient seemed to be very vague in his understanding of his current situation. He is saying now that he is looking [facility] but I'm not sure he understands exactly what that means. Situation reviewed with social work. Apparently [facility] may have a bed available within a couple weeks.  Follow-up Thursday the 27th. Patient seen. Patient was walking around the unit today. Fairly neatly dressed. Appropriate interaction. Still confused and has very little understanding of why he is here or what is going on. Affect was upbeat and smiling. Still unable to make any real decisions for himself about his discharge plan. Evidently no clear or safe discharge plan has yet been arrived at  HPI:  Patient interviewed chart reviewed. I saw this gentleman last week in the hospital as well. Last time he was here in the hospital he had presented with a very elevated blood sugar and with agitated oppositional  behavior when the Department of Social Services tried to place him at the [place]. My evaluation last week was that this was a patient with dementia probably mixed vascular and Alzheimer's with psychotic symptoms and behavioral disturbance. I had taken him off commitment and suggested he be discharged and placed. Apparently this was attempted and once again the patient immediately became physically aggressive and disruptive to the point that it was impossible to place him and once again he came back to the hospital with a blood sugar of almost 700. I tried to speak with him this evening and the patient became agitated almost immediately upon seeing me. I tried to engage him in some conversation about his medical history and he just escalated more and more. Refused to listen to any reason from me or from any of the nursing aides patient appeared to be very confused at some points. 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 This appeared to refer to a conversation that he and the nurse had been having about a piece of music. He was able to have brief bits of conversation that were lucid but then became emotionally very ramped up and I could not get him to calm down.  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 I have not spoken to any family and I'm not aware of any are currently involved. Patient is insisting that he will  go back to his own home although it has been explained to him repeatedly that he is no longer able to make decisions for himself.  Medical history: Diabetes. Tried to get him to engage in some conversation about it but he gets so agitated I can't even tell how much he understands his illness.  Substance abuse history: Nonidentified  Past Psychiatric History: Patient does not have any known past  psychiatric history that I have been able to identify although after interacting with him today and speaking with the hospitalist I am wondering more as to whether he might have an underlying bipolar disorder or other kind of mental illness since his behavior and presentation seems atypical for simply being demented.  Risk to Self: Is patient at risk for suicide?: No Risk to Others:   Prior Inpatient Therapy:   Prior Outpatient Therapy:    Past Medical History:  Past Medical History:  Diagnosis Date  . Arthritis   . Diabetes mellitus without complication [facility])     Past Surgical History:  Procedure Laterality Date  . none     Family History:  Family History  Problem Relation Age of Onset  . Family history unknown: Yes   Family Psychiatric  History: Unknown Social History:  History  Alcohol Use No     History  Drug Use No    Social History   Social History  . Marital status: Single    Spouse name: N/A  . Number of children: N/A  . Years of education: N/A   Occupational History  . retired    Social History Main Topics  . Smoking status: Never Smoker  . Smokeless tobacco: Never Used  . Alcohol use No  . Drug use: No  . Sexual activity: Not Asked   Other Topics Concern  . None   Social History Narrative  . None   Additional Social History:    Allergies:   Allergies  Allergen Reactions  . Aspirin Nausea And Vomiting  . Escitalopram Other (See Comments)  . Metformin Other (See Comments)  . Quinine Nausea And Vomiting    Labs:  Results for orders placed or performed during the hospital encounter of 10/26/16 (from the past 48 hour(s))  Glucose, capillary     Status: Abnormal   Collection Time: 11/06/16  9:26 PM  Result Value Ref Range   Glucose-Capillary 260 (H) 65 - 99 mg/dL  Glucose, capillary     Status: Abnormal   Collection Time: 11/07/16  8:04 AM  Result Value Ref Range   Glucose-Capillary 125 (H) 65 - 99 mg/dL   Comment 1 Notify RN   Glucose,  capillary     Status: Abnormal   Collection Time: 11/07/16 12:08 PM  Result Value Ref Range   Glucose-Capillary 192 (H) 65 - 99 mg/dL   Comment 1 Notify RN   Glucose, capillary     Status: Abnormal   Collection Time: 11/07/16  5:13 PM  Result Value Ref Range   Glucose-Capillary 156 (H) 65 - 99 mg/dL   Comment 1 Notify RN   Glucose, capillary     Status: Abnormal   Collection Time: 11/07/16  8:51 PM  Result Value Ref Range   Glucose-Capillary 269 (H) 65 - 99 mg/dL  Glucose, capillary     Status: Abnormal   Collection Time: 11/08/16  7:53 AM  Result Value Ref Range   Glucose-Capillary 297 (H) 65 - 99 mg/dL   Comment 1 Notify RN   Glucose, capillary  Status: Abnormal   Collection Time: 11/08/16 11:56 AM  Result Value Ref Range   Glucose-Capillary 132 (H) 65 - 99 mg/dL   Comment 1 Notify RN   Glucose, capillary     Status: Abnormal   Collection Time: 11/08/16  4:34 PM  Result Value Ref Range   Glucose-Capillary 133 (H) 65 - 99 mg/dL   Comment 1 Notify RN     Current Facility-Administered Medications  Medication Dose Route Frequency Provider Last Rate Last Dose  . acetaminophen (TYLENOL) tablet 650 mg  650 mg Oral Q6H PRN Ihor Austin, MD   650 mg at 11/05/16 2000   Or  . acetaminophen (TYLENOL) suppository 650 mg  650 mg Rectal Q6H PRN Ihor Austin, MD      . amLODipine (NORVASC) tablet 2.5 mg  2.5 mg Oral Daily Pyreddy, Pavan, MD   2.5 mg at 11/08/16 1036  . docusate sodium (COLACE) capsule 100 mg  100 mg Oral BID Auburn Bilberry, MD   100 mg at 11/08/16 1037  . enoxaparin (LOVENOX) injection 40 mg  40 mg Subcutaneous Q24H Pyreddy, Vivien Rota, MD   40 mg at 11/08/16 1034  . fluticasone (FLONASE) 50 MCG/ACT nasal spray 2 spray  2 spray Each Nare QHS Marguarite Arbour, MD   2 spray at 11/07/16 2110  . HYDROcodone-acetaminophen (NORCO) 10-325 MG per tablet 1 tablet  1 tablet Oral Q6H PRN Ihor Austin, MD   1 tablet at 11/08/16 1348  . insulin aspart (novoLOG) injection 0-15  Units  0-15 Units Subcutaneous TID WC Ihor Austin, MD   2 Units at 11/08/16 1707  . insulin aspart (novoLOG) injection 0-5 Units  0-5 Units Subcutaneous QHS Ihor Austin, MD   2 Units at 11/07/16 2111  . insulin glargine (LANTUS) injection 40 Units  40 Units Subcutaneous Daily Auburn Bilberry, MD   40 Units at 11/08/16 1043  . LORazepam (ATIVAN) tablet 0.5 mg  0.5 mg Oral Q8H PRN Ihor Austin, MD   0.5 mg at 11/04/16 0031  . losartan (COZAAR) tablet 100 mg  100 mg Oral Daily Pyreddy, Pavan, MD   100 mg at 11/08/16 1036  . ondansetron (ZOFRAN) tablet 4 mg  4 mg Oral Q6H PRN Ihor Austin, MD   4 mg at 10/29/16 2048   Or  . ondansetron (ZOFRAN) injection 4 mg  4 mg Intravenous Q6H PRN Pyreddy, Vivien Rota, MD      . risperiDONE (RISPERDAL M-TABS) disintegrating tablet 1 mg  1 mg Oral TID Aybree Lanyon, Jackquline Denmark, MD   1 mg at 11/08/16 1710  . senna-docusate (Senokot-S) tablet 1 tablet  1 tablet Oral QHS PRN Ihor Austin, MD        Musculoskeletal: Strength & Muscle Tone: within normal limits Gait & Station: unsteady Patient leans: N/A  Psychiatric Specialty Exam: Physical Exam  Nursing note and vitals reviewed. Constitutional: He appears well-developed and well-nourished.  HENT:  Head: Normocephalic and atraumatic.  Eyes: Pupils are equal, round, and reactive to light. Conjunctivae are normal.  Neck: Normal range of motion.  Cardiovascular: Regular rhythm and normal heart sounds.   Respiratory: Effort normal. No respiratory distress.  GI: Soft.  Musculoskeletal: Normal range of motion.  Neurological: He is alert.  Skin: Skin is warm and dry.  Psychiatric: His affect is not labile and not inappropriate. His speech is tangential. His speech is not rapid and/or pressured. Thought content is paranoid. Cognition and memory are impaired. He expresses impulsivity and inappropriate judgment. He expresses no homicidal and no suicidal ideation.  He exhibits abnormal recent memory.    Review of Systems   Constitutional: Negative.   HENT: Negative.   Eyes: Negative.   Respiratory: Negative.   Cardiovascular: Negative.   Gastrointestinal: Negative.   Musculoskeletal: Negative.   Skin: Negative.   Neurological: Negative.   Psychiatric/Behavioral: Negative for depression, hallucinations, memory loss, substance abuse and suicidal ideas. The patient is not nervous/anxious and does not have insomnia.    [facility] Face-to-Face Psychiatry Consult   Reason for Consult:  Consult for 83 year old man who was brought back to the hospital almost immediately after discharge because of his agitated behavior that prevented placement. Referring Physician:  Sudini Patient Identification: Derrich Gaby MRN:  [id number] Principal Diagnosis: Psychosis [facility]) Diagnosis:   Patient Active Problem List   Diagnosis Date Noted  . Psychosis [F29] 10/29/2016  . Hyperglycemia [R73.9] 10/23/2016  . Dementia, Alzheimer's, with behavior disturbance [G30.9, F02.81] 10/23/2016    Total Time spent with patient: 15 minutes  Subjective:   Ayce Pietrzyk is a 83 y.o. male patient admitted with "get out of here! I want nothing to do with you! 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  HPI:  Patient interviewed chart reviewed. I saw this gentleman last week in the hospital as well. Last time he was here in the hospital he had presented with a very elevated blood sugar and with agitated oppositional behavior when the Department of Social Services tried to place him at the [place]. My evaluation last week was that this was a patient with dementia probably mixed vascular and Alzheimer's with psychotic symptoms and behavioral disturbance. I had taken him off commitment and suggested he be discharged and placed. Apparently this was attempted and once again the patient immediately became physically aggressive and disruptive to the point that it was impossible to place him and once again he came back to the hospital with a blood sugar of almost  700. I tried to speak with him this evening and the patient became agitated almost immediately upon seeing me. I tried to engage him in some conversation about his medical history and he just escalated more and more. Refused to listen to any reason from me or from any of the nursing aides patient appeared to be very confused at some points. 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 This appeared to refer to a conversation that he and the nurse had been having about a piece of music. He was able to have brief bits of conversation that were lucid but then became emotionally very ramped up and I could not get him to calm down.  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 I have not spoken to any family and I'm not aware of any are currently involved. Patient is insisting that he will go back to his own home although it has been explained to him repeatedly that he is no longer able to make decisions for himself.  Medical history: Diabetes. Tried to get him to engage in some conversation about it but he gets so agitated I can't even tell how much he understands his illness.  Substance abuse history: Nonidentified  Past Psychiatric History: Patient does not have any known past psychiatric history that I have been able to identify although after interacting with him today  and speaking with the hospitalist I am wondering more as to whether he might have an underlying bipolar disorder or other kind of mental illness since his behavior and presentation seems atypical for simply being demented.  Risk to Self: Is patient at risk for suicide?: No Risk to Others:   Prior Inpatient Therapy:   Prior Outpatient Therapy:    Past Medical History:  Past Medical History:  Diagnosis Date  . Arthritis   . Diabetes mellitus  without complication [facility])     Past Surgical History:  Procedure Laterality Date  . none     Family History:  Family History  Problem Relation Age of Onset  . Family history unknown: Yes   Family Psychiatric  History: Unknown Social History:  History  Alcohol Use No     History  Drug Use No    Social History   Social History  . Marital status: Single    Spouse name: N/A  . Number of children: N/A  . Years of education: N/A   Occupational History  . retired    Social History Main Topics  . Smoking status: Never Smoker  . Smokeless tobacco: Never Used  . Alcohol use No  . Drug use: No  . Sexual activity: Not Asked   Other Topics Concern  . None   Social History Narrative  . None   Additional Social History:    Allergies:   Allergies  Allergen Reactions  . Aspirin Nausea And Vomiting  . Escitalopram Other (See Comments)  . Metformin Other (See Comments)  . Quinine Nausea And Vomiting    Labs:  Results for orders placed or performed during the hospital encounter of 10/26/16 (from the past 48 hour(s))  Glucose, capillary     Status: Abnormal   Collection Time: 11/06/16  9:26 PM  Result Value Ref Range   Glucose-Capillary 260 (H) 65 - 99 mg/dL  Glucose, capillary     Status: Abnormal   Collection Time: 11/07/16  8:04 AM  Result Value Ref Range   Glucose-Capillary 125 (H) 65 - 99 mg/dL   Comment 1 Notify RN   Glucose, capillary     Status: Abnormal   Collection Time: 11/07/16 12:08 PM  Result Value Ref Range   Glucose-Capillary 192 (H) 65 - 99 mg/dL   Comment 1 Notify RN   Glucose, capillary     Status: Abnormal   Collection Time: 11/07/16  5:13 PM  Result Value Ref Range   Glucose-Capillary 156 (H) 65 - 99 mg/dL   Comment 1 Notify RN   Glucose, capillary     Status: Abnormal   Collection Time: 11/07/16  8:51 PM  Result Value Ref Range   Glucose-Capillary 269 (H) 65 - 99 mg/dL  Glucose, capillary     Status: Abnormal   Collection Time:  11/08/16  7:53 AM  Result Value Ref Range   Glucose-Capillary 297 (H) 65 - 99 mg/dL   Comment 1 Notify RN   Glucose, capillary     Status: Abnormal   Collection Time: 11/08/16 11:56 AM  Result Value Ref Range   Glucose-Capillary 132 (H) 65 - 99 mg/dL   Comment 1 Notify RN   Glucose, capillary     Status: Abnormal   Collection Time: 11/08/16  4:34 PM  Result Value Ref Range   Glucose-Capillary 133 (H) 65 - 99 mg/dL   Comment 1 Notify RN     Current Facility-Administered Medications  Medication Dose Route Frequency Provider Last Rate Last Dose  .  acetaminophen (TYLENOL) tablet 650 mg  650 mg Oral Q6H PRN Ihor Austin, MD   650 mg at 11/05/16 2000   Or  . acetaminophen (TYLENOL) suppository 650 mg  650 mg Rectal Q6H PRN Ihor Austin, MD      . amLODipine (NORVASC) tablet 2.5 mg  2.5 mg Oral Daily Pyreddy, Pavan, MD   2.5 mg at 11/08/16 1036  . docusate sodium (COLACE) capsule 100 mg  100 mg Oral BID Auburn Bilberry, MD   100 mg at 11/08/16 1037  . enoxaparin (LOVENOX) injection 40 mg  40 mg Subcutaneous Q24H Pyreddy, Vivien Rota, MD   40 mg at 11/08/16 1034  . fluticasone (FLONASE) 50 MCG/ACT nasal spray 2 spray  2 spray Each Nare QHS Marguarite Arbour, MD   2 spray at 11/07/16 2110  . HYDROcodone-acetaminophen (NORCO) 10-325 MG per tablet 1 tablet  1 tablet Oral Q6H PRN Ihor Austin, MD   1 tablet at 11/08/16 1348  . insulin aspart (novoLOG) injection 0-15 Units  0-15 Units Subcutaneous TID WC Ihor Austin, MD   2 Units at 11/08/16 1707  . insulin aspart (novoLOG) injection 0-5 Units  0-5 Units Subcutaneous QHS Ihor Austin, MD   2 Units at 11/07/16 2111  . insulin glargine (LANTUS) injection 40 Units  40 Units Subcutaneous Daily Auburn Bilberry, MD   40 Units at 11/08/16 1043  . LORazepam (ATIVAN) tablet 0.5 mg  0.5 mg Oral Q8H PRN Ihor Austin, MD   0.5 mg at 11/04/16 0031  . losartan (COZAAR) tablet 100 mg  100 mg Oral Daily Pyreddy, Pavan, MD   100 mg at 11/08/16 1036  .  ondansetron (ZOFRAN) tablet 4 mg  4 mg Oral Q6H PRN Ihor Austin, MD   4 mg at 10/29/16 2048   Or  . ondansetron (ZOFRAN) injection 4 mg  4 mg Intravenous Q6H PRN Pyreddy, Vivien Rota, MD      . risperiDONE (RISPERDAL M-TABS) disintegrating tablet 1 mg  1 mg Oral TID Marifer Hurd, Jackquline Denmark, MD   1 mg at 11/08/16 1710  . senna-docusate (Senokot-S) tablet 1 tablet  1 tablet Oral QHS PRN Ihor Austin, MD        Musculoskeletal: Strength & Muscle Tone: within normal limits Gait & Station: unsteady Patient leans: N/A  Psychiatric Specialty Exam: Physical Exam  Nursing note and vitals reviewed. Constitutional: He appears well-developed and well-nourished.  HENT:  Head: Normocephalic and atraumatic.  Eyes: Pupils are equal, round, and reactive to light. Conjunctivae are normal.  Neck: Normal range of motion.  Cardiovascular: Regular rhythm and normal heart sounds.   Respiratory: Effort normal. No respiratory distress.  GI: Soft.  Musculoskeletal: Normal range of motion.  Neurological: He is alert.  Skin: Skin is warm and dry.  Psychiatric: His affect is labile and inappropriate. His speech is rapid and/or pressured and tangential. He is agitated. Thought content is paranoid. Cognition and memory are impaired. He expresses impulsivity and inappropriate judgment. He expresses no homicidal and no suicidal ideation. He exhibits abnormal recent memory.    Review of Systems  Constitutional: Negative.   HENT: Negative.   Eyes: Negative.   Respiratory: Negative.   Cardiovascular: Negative.   Gastrointestinal: Negative.   Musculoskeletal: Negative.   Skin: Negative.   Neurological: Negative.   Psychiatric/Behavioral: Negative for depression, hallucinations, memory loss, substance abuse and suicidal ideas. The patient is not nervous/anxious and does not have insomnia.     Blood pressure (!) 143/55, pulse 91, temperature 98 F (36.7 C), temperature source Oral,  resp. rate 16, height  (1.727 m),  weight 76.4 kg (168 lb 6.4 oz), SpO2 98 %.Body mass index is 25.61 kg/m.  General Appearance: Casual  Eye Contact:  Fair  Speech:  Pressured  Volume:  Increased  Mood:  Angry  Affect:  Congruent and Inappropriate  Thought Process:  Disorganized  Orientation:  Negative  Thought Content:  Illogical, Paranoid Ideation and Tangential  Suicidal Thoughts:  No  Homicidal Thoughts:  No  Memory:  Negative  Judgement:  Poor  Insight:  Lacking  Psychomotor Activity:  Restlessness  Concentration:  Concentration: Poor  Recall:  Poor  Fund of Knowledge:  Poor  Language:  Fair  Akathisia:  No  Handed:  Right  AIMS (if indicated):     Assets:  Communication Skills  ADL's:  Impaired  Cognition:  Impaired,  Mild  Sleep:        Treatment Plan Summary: Daily contact with patient to assess and evaluate symptoms and progress in treatment, Medication management and Plan At this point I am more convinced that the patient probably does need psychiatric treatment. His behavior is irrational and not showing any signs of improving. If anything it is getting worse. I agreed with the hospitalist that the patient probably should be started on some medicine and have started risperidone. I started with a low dose but if he is not oversedated we can try to titrate that up. Under the current circumstances I can't see how we can discontinue the IVC given the failure to place him twice in a row. I appreciate social work's attempts to refer him to [facility] psychiat[facility] hospitals which I think is probably the best course for now. I will try to follow up as best I can.  Disposition: Recommend psychiatric Inpatient admission when medically cleared. Supportive therapy provided about ongoing stressors.  Mordecai Rasmussen, MD 11/08/2016 6:23 PM  Blood pressure (!) 143/55, pulse 91, temperature 98 F (36.7 C), temperature source Oral, resp. rate 16, height  (1.727 m), weight 76.4 kg (168 lb 6.4 oz), SpO2 98 %.Body mass  index is 25.61 kg/m.  General Appearance: Casual  Eye Contact:  Fair  Speech:  Pressured  Volume:  Increased  Mood:  Angry  Affect:  Congruent and Inappropriate  Thought Process:  Disorganized  Orientation:  Negative  Thought Content:  Illogical, Paranoid Ideation and Tangential  Suicidal Thoughts:  No  Homicidal Thoughts:  No  Memory:  Negative  Judgement:  Poor  Insight:  Lacking  Psychomotor Activity:  Restlessness  Concentration:  Concentration: Poor  Recall:  Poor  Fund of Knowledge:  Poor  Language:  Fair  Akathisia:  No  Handed:  Right  AIMS (if indicated):     Assets:  Communication Skills  ADL's:  Impaired  Cognition:  Impaired,  Mild  Sleep:        Treatment Plan Summary: Daily contact with patient to assess and evaluate symptoms and progress in treatment, Medication management and Plan Patient appears to be calm her and no longer prone to angry outbursts for the most part however he still is uncooperative with discharge planning. Continue hospitalization while his guardians work on finding a safe discharge plan.  Disposition: Recommend psychiatric Inpatient admission when medically cleared. Supportive therapy provided about ongoing stressors.  Mordecai Rasmussen, MD 11/08/2016 6:23 PM

## 2016-11-08 NOTE — Progress Notes (Signed)
Inpatient Diabetes Program Recommendations  AACE/ADA: New Consensus Statement on Inpatient Glycemic Control (2015)  Target Ranges:  Prepandial:   less than 140 mg/dL      Peak postprandial:   less than 180 mg/dL (1-2 hours)      Critically ill patients:  140 - 180 mg/dL   Lab Results  Component Value Date   GLUCAP 132 (H) 11/08/2016   HGBA1C 8.0 (H) 06/25/2013   Care everywhere A1C 11.5% on 07/06/2016   Recommendations for discharge: Based on the most recent A1C, patient would benefit from dual therapy to control blood sugars.   Consider Lantus 30 units qhs, and add Trulicity 0.75mg  once weekly or Farxiga  qday.(GFR  >60, no history of bladder cancer).  Both medications will require the patient drink plenty of fluids.  Farxiga-watch for UTI, hypotension and genital infections.  Susette Racer, RN, BA, MHA, CDE Diabetes Coordinator Inpatient Diabetes Program  [phone] (Team Pager) [phone] [facility] Office) 11/08/2016 2:26 PM

## 2016-11-08 NOTE — Clinical Social Work Note (Signed)
At this time, it has been coordinated with the Diabetes Coordinator an acceptable outpatient plan to manage his diabetes. CSW has spoken to DSS guardian, Marylene Land, and she has stated that DSS has signed a lease with [facility] for patient to have an apartment. Due to payment and move in date discharge date DSS will plan to get patient on Monday. Meanwhile patient is on wait list at [facility]. Plan will be discussed with patient once plans are known for sure. [name] MSW,LCSW [phone]

## 2016-11-08 NOTE — Progress Notes (Signed)
Inpatient Diabetes Program Recommendations  AACE/ADA: New Consensus Statement on Inpatient Glycemic Control (2015)  Target Ranges:  Prepandial:   less than 140 mg/dL      Peak postprandial:   less than 180 mg/dL (1-2 hours)      Critically ill patients:  140 - 180 mg/dL   Lab Results  Component Value Date   GLUCAP 297 (H) 11/08/2016   HGBA1C 8.0 (H) 06/25/2013   Results for DAGMAWI, VENABLE (MRN [id number]) as of 11/08/2016 12:21  Ref. Range 11/07/2016 08:04 11/07/2016 12:08 11/07/2016 17:13 11/07/2016 20:51 11/08/2016 07:53  Glucose-Capillary Latest Ref Range: 65 - 99 mg/dL 119 (H) 147 (H) 829 (H) 269 (H) 297 (H)   Home DM Meds: Lantus 30 units daily  Current Insulin Orders: Lantus 40 units daily Novolog Moderate Correction Scale/ SSI (0-15 units) TID AC    Novolog mealtime insulin d/c yesterday prior to supper, resulting in elevated fasting CBG this am.   Lantus increased to 40 units qday.   Novolog correction brought am CBG from /dl this am to /dl now.   Susette Racer, RN, BA, MHA, CDE Diabetes Coordinator Inpatient Diabetes Program  [phone] (Team Pager) [phone] [facility] Office) 11/08/2016 12:22 PM

## 2016-11-08 NOTE — Progress Notes (Signed)
[facility] - [place] at [facility]   PATIENT NAME: Jesus Holland    MR#:  [id number]  DATE OF BIRTH:  1928/04/05  SUBJECTIVE:  CHIEF COMPLAINT:   Chief Complaint  Patient presents with  . Psychiatric Evaluation   No new complaints   REVIEW OF SYSTEMS:    Review of Systems  Constitutional: Negative for chills, fever and weight loss.  HENT: Negative for nosebleeds and sore throat.   Eyes: Negative for blurred vision.  Respiratory: Negative for cough, shortness of breath and wheezing.   Cardiovascular: Negative for chest pain, orthopnea, leg swelling and PND.  Gastrointestinal: Negative for abdominal pain, constipation, diarrhea, heartburn, nausea and vomiting.  Genitourinary: Negative for dysuria and urgency. Flank pain:   Musculoskeletal: Negative for back pain.  Skin: Negative for rash.  Neurological: Negative for dizziness, speech change, focal weakness and headaches.  Endo/Heme/Allergies: Does not bruise/bleed easily.  Psychiatric/Behavioral: Negative for depression.   DRUG ALLERGIES:   Allergies  Allergen Reactions  . Aspirin Nausea And Vomiting  . Escitalopram Other (See Comments)  . Metformin Other (See Comments)  . Quinine Nausea And Vomiting    VITALS:  Blood pressure (!) 158/86, pulse 88, temperature 97.7 F (36.5 C), temperature source Oral, resp. rate 16, height  (1.727 m), weight 168 lb 6.4 oz (76.4 kg), SpO2 100 %.  PHYSICAL EXAMINATION:  Physical Exam  Constitutional: He is oriented to person, place, and time and well-developed, well-nourished, and in no distress.  HENT:  Head: Normocephalic and atraumatic.  Eyes: Pupils are equal, round, and reactive to light. Conjunctivae and EOM are normal.  Neck: Normal range of motion. Neck supple. No tracheal deviation present. No thyromegaly present.  Cardiovascular: Normal rate, regular rhythm and normal heart sounds.   Pulmonary/Chest: Effort normal and breath sounds normal. No  respiratory distress. He has no wheezes. He exhibits no tenderness.  Abdominal: Soft. Bowel sounds are normal. He exhibits no distension. There is no tenderness.  Musculoskeletal: Normal range of motion.  Neurological: He is alert and oriented to person, place, and time. No cranial nerve deficit.  Skin: Skin is warm and dry. No rash noted.  Psychiatric: Mood normal. His affect is inappropriate. He expresses impulsivity. He is concerned with wish fulfillment.   LABORATORY PANEL:   CBC  Recent Labs Lab 11/05/16 0646  WBC 8.4  HGB 12.6*  HCT 36.4*  PLT 254   ------------------------------------------------------------------------------------------------------------------ Chemistries   Recent Labs Lab 11/05/16 0646  CREATININE 1.02   ------------------------------------------------------------------------------------------------------------------   ASSESSMENT AND PLAN:   * Uncontrolled DM with hyperglycemia: present on admission Due to missing insulin,  Due to patient's labile eating habit blood sugars labile Also there may be arrangement for patient to go independent living where he will not be able to take pre-meal insulin  has been stopped yesterday Blood sugars elevated Lantus   * Dementia with behavioral disturbance Has legal gaurdian and now in IVC with sitter due to patient refusing to agitation Difficult situation for placement. - waiting for placement , SW aware and working on it. -Waiting for Geropsych bed  - continue risperidone & ativan prn - Appreciate psychiatry input  * HTN - continue losartan and norvasc  *DVT prophylaxis with Lovenox   All the records are reviewed and case discussed with Care Management/Social Worker Management plans discussed with the patient, nursing and they are in agreement.  CODE STATUS: FULL CODE  DVT Prophylaxis: SCDs  TOTAL TIME TAKING CARE OF THIS PATIENT: 20 minutes.   POSSIBLE  D/C IN 1-2 DAYS, DEPENDING ON CLINICAL  CONDITION. Waiting for  disposition Auburn Bilberry M.D on 11/08/2016 at 2:09 PM  Between 7am to 6pm - Pager - [phone]  After 6pm go to [email or web address] - password [email or web address] [facility]  [facility] [place] [facility]  Office  [phone]  CC: Primary care physician; Marguarite Arbour, MD  Note: This dictation was prepared with Dragon dictation along with smaller phrase technology. Any transcriptional errors that result from this process are unintentional.

## 2016-11-08 NOTE — Progress Notes (Signed)
Patient successfully self administered his dinner time insulin into his inner left thigh.

## 2016-11-08 NOTE — Progress Notes (Signed)
In door way

## 2016-11-09 LAB — GLUCOSE, CAPILLARY
GLUCOSE-CAPILLARY: 68 mg/dL (ref 65–99)
GLUCOSE-CAPILLARY: 132 mg/dL — AB (ref 65–99)
GLUCOSE-CAPILLARY: 133 mg/dL — AB (ref 65–99)
GLUCOSE-CAPILLARY: 270 mg/dL — AB (ref 65–99)
Glucose-Capillary: 76 mg/dL (ref 65–99)

## 2016-11-09 LAB — CBC
HCT: 37.8 % — ABNORMAL LOW (ref 40.0–52.0)
Hemoglobin: 12.9 g/dL — ABNORMAL LOW (ref 13.0–18.0)
MCH: 30.5 pg (ref 26.0–34.0)
MCHC: 34.2 g/dL (ref 32.0–36.0)
MCV: 89.2 fL (ref 80.0–100.0)
PLATELETS: 275 10*3/uL (ref 150–440)
RBC: 4.24 MIL/uL — AB (ref 4.40–5.90)
RDW: 13.6 % (ref 11.5–14.5)
WBC: 6.9 10*3/uL (ref 3.8–10.6)

## 2016-11-09 LAB — CREATININE, SERUM
CREATININE: 0.94 mg/dL (ref 0.61–1.24)
GFR calc Af Amer: >60 mL/min (ref >60)
GFR calc non Af Amer: >60 mL/min (ref >60)

## 2016-11-09 NOTE — Progress Notes (Signed)
[facility] - [place] at [facility]   PATIENT NAME: Jesus Holland    MR#:  [id number]  DATE OF BIRTH:  01/29/1929  SUBJECTIVE:  CHIEF COMPLAINT:   Chief Complaint  Patient presents with  . Psychiatric Evaluation   Pt wating placement   REVIEW OF SYSTEMS:    Review of Systems  Constitutional: Negative for chills, fever and weight loss.  HENT: Negative for nosebleeds and sore throat.   Eyes: Negative for blurred vision.  Respiratory: Negative for cough, shortness of breath and wheezing.   Cardiovascular: Negative for chest pain, orthopnea, leg swelling and PND.  Gastrointestinal: Negative for abdominal pain, constipation, diarrhea, heartburn, nausea and vomiting.  Genitourinary: Negative for dysuria and urgency. Flank pain:   Musculoskeletal: Negative for back pain.  Skin: Negative for rash.  Neurological: Negative for dizziness, speech change, focal weakness and headaches.  Endo/Heme/Allergies: Does not bruise/bleed easily.  Psychiatric/Behavioral: Negative for depression.   DRUG ALLERGIES:   Allergies  Allergen Reactions  . Aspirin Nausea And Vomiting  . Escitalopram Other (See Comments)  . Metformin Other (See Comments)  . Quinine Nausea And Vomiting    VITALS:  Blood pressure (!) 136/59, pulse 81, temperature 98.1 F (36.7 C), temperature source Oral, resp. rate 16, height  (1.727 m), weight 168 lb 6.4 oz (76.4 kg), SpO2 95 %.  PHYSICAL EXAMINATION:  Physical Exam  Constitutional: He is oriented to person, place, and time and well-developed, well-nourished, and in no distress.  HENT:  Head: Normocephalic and atraumatic.  Eyes: Pupils are equal, round, and reactive to light. Conjunctivae and EOM are normal.  Neck: Normal range of motion. Neck supple. No tracheal deviation present. No thyromegaly present.  Cardiovascular: Normal rate, regular rhythm and normal heart sounds.   Pulmonary/Chest: Effort normal and breath sounds normal. No  respiratory distress. He has no wheezes. He exhibits no tenderness.  Abdominal: Soft. Bowel sounds are normal. He exhibits no distension. There is no tenderness.  Musculoskeletal: Normal range of motion.  Neurological: He is alert and oriented to person, place, and time. No cranial nerve deficit.  Skin: Skin is warm and dry. No rash noted.  Psychiatric: Mood normal. His affect is not inappropriate. He is not concerned with wish fulfillment.   LABORATORY PANEL:   CBC  Recent Labs Lab 11/09/16 0345  WBC 6.9  HGB 12.9*  HCT 37.8*  PLT 275   ------------------------------------------------------------------------------------------------------------------ Chemistries   Recent Labs Lab 11/09/16 0345  CREATININE 0.94   ------------------------------------------------------------------------------------------------------------------   ASSESSMENT AND PLAN:   * Uncontrolled DM with hyperglycemia: present on admission Due to missing insulin,  Due to patient's labile eating habit blood sugars labile Also there may be arrangement for patient to go independent living where he will not be able to take pre-meal insulin  has been stopped  Blood sugars improved with increase in Lantus dose  * Dementia with behavioral disturbance Has legal gaurdian and now in IVC with sitter due to patient refusing to agitation Difficult situation for placement. - waiting for placement , SW aware and working on it. -Waiting for Geropsych bed vs going to assited living - continue risperidone & ativan prn - Appreciate psychiatry input  * HTN - continue losartan and norvasc  *DVT prophylaxis with Lovenox   All the records are reviewed and case discussed with Care Management/Social Worker Management plans discussed with the patient, nursing and they are in agreement.  CODE STATUS: FULL CODE  DVT Prophylaxis: SCDs  TOTAL TIME TAKING CARE OF THIS  PATIENT: 20 minutes.   POSSIBLE D/C IN 1-2 DAYS,  DEPENDING ON CLINICAL CONDITION. Waiting for  disposition Auburn Bilberry M.D on 11/09/2016 at 2:57 PM  Between 7am to 6pm - Pager - [phone]  After 6pm go to [email or web address] - password [email or web address] [facility]  [facility] [place] [facility]  Office  [phone]  CC: Primary care physician; Marguarite Arbour, MD  Note: This dictation was prepared with Dragon dictation along with smaller phrase technology. Any transcriptional errors that result from this process are unintentional.

## 2016-11-09 NOTE — Progress Notes (Signed)
MD stated he would re evaluate IVC orders that need to be renewed.

## 2016-11-10 LAB — GLUCOSE, CAPILLARY
GLUCOSE-CAPILLARY: 73 mg/dL (ref 65–99)
GLUCOSE-CAPILLARY: 54 mg/dL — AB (ref 65–99)
Glucose-Capillary: 304 mg/dL — ABNORMAL HIGH (ref 65–99)
Glucose-Capillary: 120 mg/dL — ABNORMAL HIGH (ref 65–99)
Glucose-Capillary: 230 mg/dL — ABNORMAL HIGH (ref 65–99)

## 2016-11-10 LAB — CREATININE, SERUM: Creatinine, Ser: 1.03 mg/dL (ref 0.61–1.24)

## 2016-11-10 NOTE — Progress Notes (Signed)
[facility] - [place] at [facility]   PATIENT NAME: Jesus Holland    MR#:  [id number]  DATE OF BIRTH:  1928/02/18   denies any complaints   CHIEF COMPLAINT:   Chief Complaint  Patient presents with  . Psychiatric Evaluation   No new complaints   REVIEW OF SYSTEMS:    Review of Systems  Constitutional: Negative for chills, fever and weight loss.  HENT: Negative for nosebleeds and sore throat.   Eyes: Negative for blurred vision.  Respiratory: Negative for cough, shortness of breath and wheezing.   Cardiovascular: Negative for chest pain, orthopnea, leg swelling and PND.  Gastrointestinal: Negative for abdominal pain, constipation, diarrhea, heartburn, nausea and vomiting.  Genitourinary: Negative for dysuria and urgency. Flank pain:   Musculoskeletal: Negative for back pain.  Skin: Negative for rash.  Neurological: Negative for dizziness, speech change, focal weakness and headaches.  Endo/Heme/Allergies: Does not bruise/bleed easily.  Psychiatric/Behavioral: Negative for depression.   DRUG ALLERGIES:   Allergies  Allergen Reactions  . Aspirin Nausea And Vomiting  . Escitalopram Other (See Comments)  . Metformin Other (See Comments)  . Quinine Nausea And Vomiting    VITALS:  Blood pressure (!) 155/77, pulse 83, temperature 97.9 F (36.6 C), temperature source Oral, resp. rate 18, height  (1.727 m), weight 76.4 kg (168 lb 6.4 oz), SpO2 100 %.  PHYSICAL EXAMINATION:  Physical Exam  Constitutional: He is oriented to person, place, and time and well-developed, well-nourished, and in no distress.  HENT:  Head: Normocephalic and atraumatic.  Eyes: Pupils are equal, round, and reactive to light. Conjunctivae and EOM are normal.  Neck: Normal range of motion. Neck supple. No tracheal deviation present. No thyromegaly present.  Cardiovascular: Normal rate, regular rhythm and normal heart sounds.   Pulmonary/Chest: Effort normal and breath sounds  normal. No respiratory distress. He has no wheezes. He exhibits no tenderness.  Abdominal: Soft. Bowel sounds are normal. He exhibits no distension. There is no tenderness.  Musculoskeletal: Normal range of motion.  Neurological: He is alert and oriented to person, place, and time. No cranial nerve deficit.  Skin: Skin is warm and dry. No rash noted.  Psychiatric: Mood normal. His affect is inappropriate. He expresses impulsivity. He is concerned with wish fulfillment.   LABORATORY PANEL:   CBC  Recent Labs Lab 11/09/16 0345  WBC 6.9  HGB 12.9*  HCT 37.8*  PLT 275   ------------------------------------------------------------------------------------------------------------------ Chemistries   Recent Labs Lab 11/10/16 0607  CREATININE 1.03   ------------------------------------------------------------------------------------------------------------------   ASSESSMENT AND PLAN:   * Uncontrolled DM with hyperglycemia: present on admission Due to missing insulin,  Due to patient's labile eating habit blood sugars labile Now better  * Dementia with behavioral disturbance Has legal gaurdian and now in IVC with sitter due to patient agitation Sitter at bedside. Difficult situation for placement. - waiting for placement , SW aware and working on it. -Waiting for Geropsych bed  - continue risperidone & ativan prn - Appreciate psychiatry input  * HTN - continue losartan and norvasc  *DVT prophylaxis with Lovenox   All the records are reviewed and case discussed with Care Management/Social Worker Management plans discussed with the patient, nursing and they are in agreement.  CODE STATUS: FULL CODE  DVT Prophylaxis: SCDs  TOTAL TIME TAKING CARE OF THIS PATIENT: 20 minutes.   POSSIBLE D/C IN 1-2 DAYS, DEPENDING ON CLINICAL CONDITION. Waiting for  disposition Marcella Dunnaway M.D on 11/10/2016 at 11:00 AM  Between 7am to 6pm -  Pager - [phone]  After 6pm go to  [email or web address] - password [email or web address] [facility]  [facility] [place] [facility]  Office  [phone]  CC: Primary care physician; Marguarite Arbour, MD  Note: This dictation was prepared with Dragon dictation along with smaller phrase technology. Any transcriptional errors that result from this process are unintentional.

## 2016-11-10 NOTE — Progress Notes (Signed)
Called Dr. Tobi Bastos regarding order for renewal of IVC.  Appropriate orders were placed.  Jesus Holland  11/10/2016  5:05 AM

## 2016-11-11 LAB — GLUCOSE, CAPILLARY
GLUCOSE-CAPILLARY: 157 mg/dL — AB (ref 65–99)
Glucose-Capillary: 194 mg/dL — ABNORMAL HIGH (ref 65–99)
Glucose-Capillary: 72 mg/dL (ref 65–99)
Glucose-Capillary: 102 mg/dL — ABNORMAL HIGH (ref 65–99)

## 2016-11-11 MED ORDER — INSULIN GLARGINE 100 UNIT/ML ~~LOC~~ SOLN
35.0000 [IU] | Freq: Every day | SUBCUTANEOUS | Status: DC
  Administered 2016-11-12: 35 [IU] via SUBCUTANEOUS
  Filled 2016-11-11 (×2): qty 0.35

## 2016-11-11 NOTE — Progress Notes (Signed)
No medical issueseported. Waiting for placement.  No new labs.vitals are stable. Hypoglycemia yesterday evening.`  #Uncontrolled DM with hyperglycemia: present on admission Due to missing insulin,  Had hypoglycemia yesterday so I decreased the dose of Lantus in the morning.  * Dementia with behavioral disturbance Has legal gaurdian and now in IVC with sitter due to patient agitation Sitter at bedside. Difficult situation for placement. - waiting for placement , SW aware and working on it. -Waiting for Geropsych bed  - continue risperidone & ativan prn - Appreciate psychiatry input  * HTN - continue losartan and norvasc  *DVT prophylaxis with Lovenox Time spent;25 min

## 2016-11-11 NOTE — Care Management Note (Signed)
Case Management Note  Patient Details  Name: Jesus Holland MRN: [id number] Date of Birth: October 16, 1928  Subjective/Objective:                    Action/Plan:   Expected Discharge Date:                  Expected Discharge Plan:     In-House Referral:     Discharge planning Services     Post Acute Care Choice:    Choice offered to:     DME Arranged:    DME Agency:     HH Arranged:    HH Agency:     Status of Service:     If discussed at [employer], dates discussed:    Additional Comments:aviodable days added 9.29-9.30.18 pending DC to facility.  Caren Macadam, RN 11/11/2016, 4:51 PM

## 2016-11-12 LAB — GLUCOSE, CAPILLARY
GLUCOSE-CAPILLARY: 160 mg/dL — AB (ref 65–99)
GLUCOSE-CAPILLARY: 147 mg/dL — AB (ref 65–99)
GLUCOSE-CAPILLARY: 126 mg/dL — AB (ref 65–99)
Glucose-Capillary: 143 mg/dL — ABNORMAL HIGH (ref 65–99)
Glucose-Capillary: 173 mg/dL — ABNORMAL HIGH (ref 65–99)

## 2016-11-12 MED ORDER — AMLODIPINE BESYLATE 2.5 MG PO TABS: 2.5000 mg | ORAL_TABLET | Freq: Every day | ORAL | 0 refills | Status: DC

## 2016-11-12 MED ORDER — RISPERIDONE 1 MG PO TBDP: 1.0000 mg | ORAL_TABLET | Freq: Three times a day (TID) | ORAL | 0 refills | Status: DC

## 2016-11-12 MED ORDER — LOSARTAN POTASSIUM 100 MG PO TABS: 100.0000 mg | ORAL_TABLET | Freq: Every day | ORAL | 0 refills | Status: DC

## 2016-11-12 MED ORDER — INSULIN ASPART 100 UNIT/ML ~~LOC~~ SOLN: 0.0000 [IU] | Freq: Three times a day (TID) | SUBCUTANEOUS | 11 refills | Status: DC

## 2016-11-12 MED ORDER — INSULIN ASPART 100 UNIT/ML ~~LOC~~ SOLN: 0.0000 [IU] | Freq: Every day | SUBCUTANEOUS | 11 refills | Status: DC

## 2016-11-12 MED ORDER — LORAZEPAM 0.5 MG PO TABS: 0.5000 mg | ORAL_TABLET | Freq: Three times a day (TID) | ORAL | 0 refills | Status: DC | PRN

## 2016-11-12 MED ORDER — INSULIN GLARGINE 100 UNIT/ML ~~LOC~~ SOLN: 35.0000 [IU] | Freq: Every day | SUBCUTANEOUS | 11 refills | Status: DC

## 2016-11-12 NOTE — Clinical Social Work Note (Signed)
CSW has worked with DSS Guardian, Cleophas Dunker, today and coordinated getting her patient's prescriptions to get them filled. Patient was going to discharge to [facility] today however DSS discovered that the apartment was not furnished. They are currently looking for a bed for patient in the short term. Discharge anticipated for tomorrow unless there are more glitches. Patient still not yet informed and will not be until plans are certain. [name] MSW,LCSW [phone]

## 2016-11-12 NOTE — Progress Notes (Signed)
[facility] - [place] at [facility]   PATIENT NAME: Jesus Holland    MR#:  [id number]  DATE OF BIRTH:  March 23, 1928   denies any complaints Possible discharge today.  CHIEF COMPLAINT:   Chief Complaint  Patient presents with  . Psychiatric Evaluation   No new complaints   REVIEW OF SYSTEMS:    Review of Systems  Constitutional: Negative for chills, fever and weight loss.  HENT: Negative for nosebleeds and sore throat.   Eyes: Negative for blurred vision.  Respiratory: Negative for cough, shortness of breath and wheezing.   Cardiovascular: Negative for chest pain, orthopnea, leg swelling and PND.  Gastrointestinal: Negative for abdominal pain, constipation, diarrhea, heartburn, nausea and vomiting.  Genitourinary: Negative for dysuria and urgency. Flank pain:   Musculoskeletal: Negative for back pain.  Skin: Negative for rash.  Neurological: Negative for dizziness, speech change, focal weakness and headaches.  Endo/Heme/Allergies: Does not bruise/bleed easily.  Psychiatric/Behavioral: Negative for depression.   DRUG ALLERGIES:   Allergies  Allergen Reactions  . Aspirin Nausea And Vomiting  . Escitalopram Other (See Comments)  . Metformin Other (See Comments)  . Quinine Nausea And Vomiting    VITALS:  Blood pressure (!) 162/82, pulse 79, temperature 97.8 F (36.6 C), temperature source Oral, resp. rate 20, height  (1.727 m), weight 76.4 kg (168 lb 6.4 oz), SpO2 100 %.  PHYSICAL EXAMINATION:  Physical Exam  Constitutional: He is oriented to person, place, and time and well-developed, well-nourished, and in no distress.  HENT:  Head: Normocephalic and atraumatic.  Eyes: Pupils are equal, round, and reactive to light. Conjunctivae and EOM are normal.  Neck: Normal range of motion. Neck supple. No tracheal deviation present. No thyromegaly present.  Cardiovascular: Normal rate, regular rhythm and normal heart sounds.   Pulmonary/Chest: Effort normal  and breath sounds normal. No respiratory distress. He has no wheezes. He exhibits no tenderness.  Abdominal: Soft. Bowel sounds are normal. He exhibits no distension. There is no tenderness.  Musculoskeletal: Normal range of motion.  Neurological: He is alert and oriented to person, place, and time. No cranial nerve deficit.  Skin: Skin is warm and dry. No rash noted.  Psychiatric: Mood normal. His affect is inappropriate. He expresses impulsivity. He is concerned with wish fulfillment.   LABORATORY PANEL:   CBC  Recent Labs Lab 11/09/16 0345  WBC 6.9  HGB 12.9*  HCT 37.8*  PLT 275   ------------------------------------------------------------------------------------------------------------------ Chemistries   Recent Labs Lab 11/10/16 0607  CREATININE 1.03   ------------------------------------------------------------------------------------------------------------------   ASSESSMENT AND PLAN:   * Uncontrolled DM with hyperglycemia: present on admission Due to missing insulin,  Due to patient's labile eating habit blood sugars labile Now better.continue Lantus, SSI with coverage of discharge, please refer to discharge instruction, all the medications are printed.  * Dementia with behavioral disturbance Has legal gaurdian and now in IVC with sitter due to patient agitation . use Ativan as needed for agitation. Continue Risperdal. Sitter at bedside. Possible discharge today and psych and DC IVC. Appropriate  - continue risperidone & ativan prn - Appreciate psychiatry input  * HTN - continue losartan and norvasc  *DVT prophylaxis with Lovenox   All the records are reviewed and case discussed with Care Management/Social Worker Management plans discussed with the patient, nursing and they are in agreement.  CODE STATUS: FULL CODE  DVT Prophylaxis: SCDs  TOTAL TIME TAKING CARE OF THIS PATIENT: 20 minutes.   POSSIBLE D/C IN 1-2 DAYS, DEPENDING ON CLINICAL  CONDITION.  Waiting for  disposition [name] M.D on 11/12/2016 at 1:57 PM  Between 7am to 6pm - Pager - [phone]  After 6pm go to [email or web address] - password [email or web address] [facility]  [facility] [place] [facility]  Office  [phone]  CC: Primary care physician; Marguarite Arbour, MD  Note: This dictation was prepared with Dragon dictation along with smaller phrase technology. Any transcriptional errors that result from this process are unintentional.

## 2016-11-12 NOTE — Progress Notes (Signed)
[facility] - [place] at [facility]   PATIENT NAME: Jesus Holland    MR#:  [id number]  DATE OF BIRTH:  February 22, 1928   denies any complaints Possible discharge today or tomorrow.  CHIEF COMPLAINT:   Chief Complaint  Patient presents with  . Psychiatric Evaluation   No new complaints   REVIEW OF SYSTEMS:    Review of Systems  Constitutional: Negative for chills, fever and weight loss.  HENT: Negative for nosebleeds and sore throat.   Eyes: Negative for blurred vision.  Respiratory: Negative for cough, shortness of breath and wheezing.   Cardiovascular: Negative for chest pain, orthopnea, leg swelling and PND.  Gastrointestinal: Negative for abdominal pain, constipation, diarrhea, heartburn, nausea and vomiting.  Genitourinary: Negative for dysuria and urgency. Flank pain:   Musculoskeletal: Negative for back pain.  Skin: Negative for rash.  Neurological: Negative for dizziness, speech change, focal weakness and headaches.  Endo/Heme/Allergies: Does not bruise/bleed easily.  Psychiatric/Behavioral: Negative for depression.   DRUG ALLERGIES:   Allergies  Allergen Reactions  . Aspirin Nausea And Vomiting  . Escitalopram Other (See Comments)  . Metformin Other (See Comments)  . Quinine Nausea And Vomiting    VITALS:  Blood pressure (!) 147/71, pulse 89, temperature (!) 97.5 F (36.4 C), temperature source Oral, resp. rate 16, height  (1.727 m), weight 76.4 kg (168 lb 6.4 oz), SpO2 99 %.  PHYSICAL EXAMINATION:  Physical Exam  Constitutional: He is oriented to person, place, and time and well-developed, well-nourished, and in no distress.  HENT:  Head: Normocephalic and atraumatic.  Eyes: Pupils are equal, round, and reactive to light. Conjunctivae and EOM are normal.  Neck: Normal range of motion. Neck supple. No tracheal deviation present. No thyromegaly present.  Cardiovascular: Normal rate, regular rhythm and normal heart sounds.    Pulmonary/Chest: Effort normal and breath sounds normal. No respiratory distress. He has no wheezes. He exhibits no tenderness.  Abdominal: Soft. Bowel sounds are normal. He exhibits no distension. There is no tenderness.  Musculoskeletal: Normal range of motion.  Neurological: He is alert and oriented to person, place, and time. No cranial nerve deficit.  Skin: Skin is warm and dry. No rash noted.  Psychiatric: Mood normal. His affect is inappropriate. He expresses impulsivity. He is concerned with wish fulfillment.   LABORATORY PANEL:   CBC  Recent Labs Lab 11/09/16 0345  WBC 6.9  HGB 12.9*  HCT 37.8*  PLT 275   ------------------------------------------------------------------------------------------------------------------ Chemistries   Recent Labs Lab 11/10/16 0607  CREATININE 1.03   ------------------------------------------------------------------------------------------------------------------   ASSESSMENT AND PLAN:   * Uncontrolled DM with hyperglycemia: present on admission Due to missing insulin,  Due to patient's labile eating habit blood sugars labile Now better.continue Lantus, SSI with coverage of discharge, please refer to discharge instruction, all the medications are printed.in preparation forpossible discharge tomorrow  * Dementia with behavioral disturbance Has legal gaurdian and now in IVC with sitter due to patient agitation . use Ativan as needed for agitation. Continue Risperdal. Sitter at bedside. Possible discharge tomorrow - continue risperidone & ativan prn - Appreciate psychiatry input  * HTN - continue losartan and norvasc  *DVT prophylaxis with Lovenox   All the records are reviewed and case discussed with Care Management/Social Worker Management plans discussed with the patient, nursing and they are in agreement.  CODE STATUS: FULL CODE  DVT Prophylaxis: SCDs  TOTAL TIME TAKING CARE OF THIS PATIENT: 20 minutes.   POSSIBLE D/C  IN 1-2 DAYS, DEPENDING ON  CLINICAL CONDITION. Waiting for  disposition Malayja Freund M.D on 11/12/2016 at 2:05 PM  Between 7am to 6pm - Pager - [phone]  After 6pm go to [email or web address] - password [email or web address] [facility]  [facility]  Office  [phone]  CC: Primary care physician; Marguarite Arbour, MD  Note: This dictation was prepared with Dragon dictation along with smaller phrase technology. Any transcriptional errors that result from this process are unintentional.

## 2016-11-13 LAB — GLUCOSE, CAPILLARY
Glucose-Capillary: 77 mg/dL (ref 65–99)
Glucose-Capillary: 214 mg/dL — ABNORMAL HIGH (ref 65–99)
Glucose-Capillary: 150 mg/dL — ABNORMAL HIGH (ref 65–99)
Glucose-Capillary: 224 mg/dL — ABNORMAL HIGH (ref 65–99)

## 2016-11-13 MED ORDER — INSULIN GLARGINE 100 UNIT/ML ~~LOC~~ SOLN
30.0000 [IU] | Freq: Every day | SUBCUTANEOUS | Status: DC
  Administered 2016-11-14 – 2016-11-15 (×2): 30 [IU] via SUBCUTANEOUS
  Filled 2016-11-13 (×2): qty 0.3

## 2016-11-13 NOTE — Clinical Social Work Note (Signed)
Currently DSS Guardian is trying to get approval to buy patient a bed and dresser for his apartment at [facility]. DSS Guardian stopped by hospital and simply said hello to patient. Patient was not very happy to see Marylene Land with DSS as he is not happy with seeing anyone from DSS because he is mad at them for obtaining guardianship. Plan for placement at [facility] still not yet to be discussed with patient. [name] MSW,LCSW [phone]

## 2016-11-13 NOTE — Progress Notes (Signed)
[facility] - [place] at [facility]   PATIENT NAME: Jesus Holland    MR#:  [id number]  DATE OF BIRTH:  May 01, 1928  Hypoglycemia this am.did not receive morning lantus,  CHIEF COMPLAINT:   Chief Complaint  Patient presents with  . Psychiatric Evaluation   No new complaints   REVIEW OF SYSTEMS:    Review of Systems  Constitutional: Negative for chills, fever and weight loss.  HENT: Negative for nosebleeds and sore throat.   Eyes: Negative for blurred vision.  Respiratory: Negative for cough, shortness of breath and wheezing.   Cardiovascular: Negative for chest pain, orthopnea, leg swelling and PND.  Gastrointestinal: Negative for abdominal pain, constipation, diarrhea, heartburn, nausea and vomiting.  Genitourinary: Negative for dysuria and urgency. Flank pain:   Musculoskeletal: Negative for back pain.  Skin: Negative for rash.  Neurological: Negative for dizziness, speech change, focal weakness and headaches.  Endo/Heme/Allergies: Does not bruise/bleed easily.  Psychiatric/Behavioral: Negative for depression.   DRUG ALLERGIES:   Allergies  Allergen Reactions  . Aspirin Nausea And Vomiting  . Escitalopram Other (See Comments)  . Metformin Other (See Comments)  . Quinine Nausea And Vomiting    VITALS:  Blood pressure (!) 147/60, pulse 73, temperature 98.1 F (36.7 C), temperature source Oral, resp. rate 18, height  (1.727 m), weight 76.4 kg (168 lb 6.4 oz), SpO2 97 %.  PHYSICAL EXAMINATION:  Physical Exam  Constitutional: He is oriented to person, place, and time and well-developed, well-nourished, and in no distress.  HENT:  Head: Normocephalic and atraumatic.  Eyes: Pupils are equal, round, and reactive to light. Conjunctivae and EOM are normal.  Neck: Normal range of motion. Neck supple. No tracheal deviation present. No thyromegaly present.  Cardiovascular: Normal rate, regular rhythm and normal heart sounds.   Pulmonary/Chest: Effort  normal and breath sounds normal. No respiratory distress. He has no wheezes. He exhibits no tenderness.  Abdominal: Soft. Bowel sounds are normal. He exhibits no distension. There is no tenderness.  Musculoskeletal: Normal range of motion.  Neurological: He is alert and oriented to person, place, and time. No cranial nerve deficit.  Skin: Skin is warm and dry. No rash noted.  Psychiatric: Mood normal. His affect is inappropriate. He expresses impulsivity. He is concerned with wish fulfillment.   LABORATORY PANEL:   CBC  Recent Labs Lab 11/09/16 0345  WBC 6.9  HGB 12.9*  HCT 37.8*  PLT 275   ------------------------------------------------------------------------------------------------------------------ Chemistries   Recent Labs Lab 11/10/16 0607  CREATININE 1.03   ------------------------------------------------------------------------------------------------------------------   ASSESSMENT AND PLAN:   * Uncontrolled DM with hyperglycemia: present on admission Due to missing insulin,  Due to patient's labile eating habit blood sugars labile Hypoglycemia today am;decrease lantus dose * Dementia with behavioral disturbance Has legal gaurdian and now in IVC with sitter due to patient agitation . use Ativan as needed for agitation. Continue Risperdal. Sitter at bedside. Possible discharge when furniture is delivered at [facility].- * HTN - continue losartan and norvasc  *DVT prophylaxis with Lovenox   All the records are reviewed and case discussed with Care Management/Social Worker Management plans discussed with the patient, nursing and they are in agreement.  CODE STATUS: FULL CODE  DVT Prophylaxis: SCDs  TOTAL TIME TAKING CARE OF THIS PATIENT: 20 minutes.   POSSIBLE D/C IN 1-2 DAYS, DEPENDING ON CLINICAL CONDITION. Waiting for  disposition Conchetta Lamia M.D on 11/13/2016 at 2:35 PM  Between 7am to 6pm - Pager - [phone]  After  6pm go to [email or web address] - password [email or web address] [facility]  [facility]  Office  [phone]  CC: Primary care physician; Marguarite Arbour, MD  Note: This dictation was prepared with Dragon dictation along with smaller phrase technology. Any transcriptional errors that result from this process are unintentional.

## 2016-11-13 NOTE — Progress Notes (Signed)
Pt blood glucose 214 per MD still hold lantus from earlier this AM.

## 2016-11-13 NOTE — Progress Notes (Signed)
Hold Lantus injection 35 units per Dr. Luberta Mutter order. BG was 77 this morning.

## 2016-11-13 NOTE — Plan of Care (Signed)
Problem: Education: Goal: Knowledge of [facility] Education information/materials will improve Outcome: Not Progressing Pt confused   

## 2016-11-14 LAB — GLUCOSE, CAPILLARY
GLUCOSE-CAPILLARY: 170 mg/dL — AB (ref 65–99)
GLUCOSE-CAPILLARY: 194 mg/dL — AB (ref 65–99)
GLUCOSE-CAPILLARY: 256 mg/dL — AB (ref 65–99)
GLUCOSE-CAPILLARY: 76 mg/dL (ref 65–99)
Glucose-Capillary: 180 mg/dL — ABNORMAL HIGH (ref 65–99)

## 2016-11-14 MED ORDER — HALOPERIDOL 2 MG PO TABS
2.0000 mg | ORAL_TABLET | Freq: Once | ORAL | Status: AC
  Filled 2016-11-14: qty 1

## 2016-11-14 MED ORDER — DIPHENHYDRAMINE HCL 50 MG/ML IJ SOLN
25.0000 mg | Freq: Once | INTRAMUSCULAR | Status: AC
  Administered 2016-11-14: 25 mg via INTRAMUSCULAR
  Filled 2016-11-14: qty 1

## 2016-11-14 MED ORDER — DIPHENHYDRAMINE HCL 25 MG PO CAPS
25.0000 mg | ORAL_CAPSULE | Freq: Once | ORAL | Status: AC
  Filled 2016-11-14: qty 1

## 2016-11-14 MED ORDER — HALOPERIDOL LACTATE 5 MG/ML IJ SOLN
2.0000 mg | Freq: Once | INTRAMUSCULAR | Status: AC
  Administered 2016-11-14: 2 mg via INTRAMUSCULAR
  Filled 2016-11-14: qty 0.4

## 2016-11-14 NOTE — Clinical Social Work Note (Signed)
DSS Guardian, Jesus Holland, obtained approval today to buy patient furniture for his new condo in [place]. Patient's behavior became more agitated today and he was given IM Haldol and Benadryl and this seemed to work to de-escalate him. In the meantime, CSW has confirmed patient remains on a very long waitlist at [facility]. CSW has faxed patient's psyc referral to: [facility], [facility], [place], [place], [place]. Due to denials from them previously it is doubtful they will be able to accept however an attempt has been made.

## 2016-11-14 NOTE — Progress Notes (Addendum)
Patient very agitated today morning, required Haldol, Benadryl to calm him down.  Vitals reviewed. Blood sugars reviewed. Assessment and plan:Uncontrolled DM with hyperglycemia: present on admission Due to missing insulin,  Due to patient's labile eating habit blood sugars labile Hypoglycemia resolved.sugars improved.    * Dementia with behavioral disturbance Has legal gaurdian and now in IVC with sitter due to patient agitation . use Ativan as needed for agitation. Continue Risperdal. Sitter at bedside. Possible discharge when furniture is delivered at [facility].- * HTN - continue losartan and norvasc  *DVT prophylaxis with Lovenox .agitation today morning received Haldol. Social worker is working on placement. Time;20 min

## 2016-11-14 NOTE — Progress Notes (Signed)
Patient noted to be increasingly more agitated over the last two days, insisting that he needed to go to the hotel and get his clothes before "they go in and throw them away".  Also patient is insisting that he needs his money so he can get someone to take him to get his clothes.  Multiple attempts to redirect patient unsuccessful.  Today patient noted to be further agitated and attempts to de-escalate the situation unsuccessful.  Psychiatrist contacted and orders obtained for haldol and benadryl to be given either PO or IM.  Attempted to give medication PO however patient refused to take.  IM medications administered after much persuasion by nursing staff.

## 2016-11-15 LAB — GLUCOSE, CAPILLARY
GLUCOSE-CAPILLARY: 47 mg/dL — AB (ref 65–99)
GLUCOSE-CAPILLARY: 80 mg/dL (ref 65–99)
Glucose-Capillary: 346 mg/dL — ABNORMAL HIGH (ref 65–99)

## 2016-11-15 MED ORDER — RISPERIDONE 2 MG PO TBDP: 2.0000 mg | ORAL_TABLET | Freq: Two times a day (BID) | ORAL | 1 refills | Status: AC

## 2016-11-15 MED ORDER — DIPHENHYDRAMINE HCL 25 MG PO CAPS
25.0000 mg | ORAL_CAPSULE | Freq: Once | ORAL | Status: AC
  Administered 2016-11-15: 25 mg via ORAL

## 2016-11-15 MED ORDER — RISPERIDONE 2 MG PO TBDP
2.0000 mg | ORAL_TABLET | Freq: Two times a day (BID) | ORAL | Status: DC
  Filled 2016-11-15: qty 1

## 2016-11-15 MED ORDER — HALOPERIDOL LACTATE 5 MG/ML IJ SOLN
2.0000 mg | Freq: Once | INTRAMUSCULAR | Status: AC
  Filled 2016-11-15: qty 0.4

## 2016-11-15 MED ORDER — INSULIN GLARGINE 100 UNIT/ML ~~LOC~~ SOLN: 30.0000 [IU] | Freq: Every day | SUBCUTANEOUS | 11 refills | Status: DC

## 2016-11-15 MED ORDER — DIPHENHYDRAMINE HCL 50 MG/ML IJ SOLN: 25.0000 mg | Freq: Once | INTRAMUSCULAR | Status: AC

## 2016-11-15 MED ORDER — HALOPERIDOL 2 MG PO TABS
2.0000 mg | ORAL_TABLET | Freq: Once | ORAL | Status: AC
  Administered 2016-11-15: 2 mg via ORAL
  Filled 2016-11-15: qty 1

## 2016-11-15 NOTE — Care Management (Signed)
It was arranged by CSW and DSS for patient to discharge today to [facility].  Patient will have care givers coming out 3 times a day.  Home health orders for home health RN.  Spoke with Marylene Land at [employer].  Home health Agency preference provided,  She states that there was no preference.  Referral made to [place] at [facility].  RNCM signing off.

## 2016-11-15 NOTE — Progress Notes (Signed)
Patient increasingly agitated this morning, again seeking a way to go get his clothes and "I need my money they are holding here".  Psychiatrist consulted and orders obtained.

## 2016-11-15 NOTE — Progress Notes (Signed)
Hypoglycemic Event  CBG: 47  Treatment: 4 oz orange juice  Symptoms: none  Follow-up CBG: Time:1202 CBG Result:80     Fredric Mare

## 2016-11-15 NOTE — Consult Note (Addendum)
[facility] Face-to-Face Psychiatry Consult   Reason for Consult:  Consult for 83 year old man who was brought back to the hospital almost immediately after discharge because of his agitated behavior that prevented placement. Referring Physician:  Sudini Patient Identification: Jesus Holland MRN:  [id number] Principal Diagnosis: Psychosis [facility]) Diagnosis:   Patient Active Problem List   Diagnosis Date Noted  . Psychosis (HCC) [F29] 10/29/2016  . Hyperglycemia [R73.9] 10/23/2016  . Dementia, Alzheimer's, with behavior disturbance [G30.9, F02.81] 10/23/2016    Total Time spent with patient: 30 minutes  Subjective:   Jesus Holland is a 83 y.o. male patient admitted with "get out of here! I want nothing to do with you! 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  This is a follow-up for this 84 year old man with a history of dementia and inappropriate behavior. I saw the patient this evening and he was unusually pleasant to me. Not sure if he really then remembered our previous interactions. Patient seemed to be very vague in his understanding of his current situation. He is saying now that he is looking forward to [facility]" but I'm not sure he understands exactly what that means. Situation reviewed with social work. Apparently [facility] may have a bed available within a couple weeks.  Follow-up Thursday the 27th. Patient seen. Patient was walking around the unit today. Fairly neatly dressed. Appropriate interaction. Still confused and has very little understanding of why he is here or what is going on. Affect was upbeat and smiling. Still unable to make any real decisions for himself about his discharge plan. Evidently no clear or safe discharge plan has yet been arrived at  Follow-up for Thursday, October 4. Patient now has a discharge plan. APS has found a suitable apartment that apparently will be acceptable to the patient. His behavior still has had a little bit of occasional  irritability although the antipsychotic medicines appear to of helped quite a bit. On interview today I found the patient awake alert and pleasantly interactive. Smiling. Not hostile or threatening or overly argumentative. When he spoke with social work about the discharge plan he expressed positive enthusiasm. He has not shown any side effects from his medication. Apparently there is a safe plan to have lots of daily care checking on him as well.  HPI:  Patient interviewed chart reviewed. I saw this gentleman last week in the hospital as well. Last time he was here in the hospital he had presented with a very elevated blood sugar and with agitated oppositional behavior when the Department of Social Services tried to place him at the [place]. My evaluation last week was that this was a patient with dementia probably mixed vascular and Alzheimer's with psychotic symptoms and behavioral disturbance. I had taken him off commitment and suggested he be discharged and placed. Apparently this was attempted and once again the patient immediately became physically aggressive and disruptive to the point that it was impossible to place him and once again he came back to the hospital with a blood sugar of almost 700. I tried to speak with him this evening and the patient became agitated almost immediately upon seeing me. I tried to engage him in some conversation about his medical history and he just escalated more and more. Refused to listen to any reason from me or from any of the nursing aides patient appeared to be very confused at some points. He insisted that he was in a jail and that he was here to teach  music to the daughter of one of the nurses. This appeared to refer to a conversation that he and the nurse had been having about a piece of music. He was able to have brief bits of conversation that were lucid but then became emotionally very ramped up and I could not get him to calm down.  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 I have not spoken to any family and I'm not aware of any are currently involved. Patient is insisting that he will go back to his own home although it has been explained to him repeatedly that he is no longer able to make decisions for himself.  Medical history: Diabetes. Tried to get him to engage in some conversation about it but he gets so agitated I can't even tell how much he understands his illness.  Substance abuse history: Nonidentified  Past Psychiatric History: Patient does not have any known past psychiatric history that I have been able to identify although after interacting with him today and speaking with the hospitalist I am wondering more as to whether he might have an underlying bipolar disorder or other kind of mental illness since his behavior and presentation seems atypical for simply being demented.  Risk to Self: Is patient at risk for suicide?: No Risk to Others:   Prior Inpatient Therapy:   Prior Outpatient Therapy:    Past Medical History:  Past Medical History:  Diagnosis Date  . Arthritis   . Diabetes mellitus without complication [facility])     Past Surgical History:  Procedure Laterality Date  . none     Family History:  Family History  Problem Relation Age of Onset  . Family history unknown: Yes   Family Psychiatric  History: Unknown Social History:  History  Alcohol Use No     History  Drug Use No    Social History   Social History  . Marital status: Single    Spouse name: N/A  . Number of children: N/A  . Years of education: N/A   Occupational History  . retired    Social History Main Topics  . Smoking status: Never Smoker  . Smokeless tobacco: Never Used  . Alcohol use No  . Drug use: No  . Sexual activity: Not Asked   Other Topics Concern  . None   Social History Narrative  . None    Additional Social History:    Allergies:   Allergies  Allergen Reactions  . Aspirin Nausea And Vomiting  . Escitalopram Other (See Comments)  . Metformin Other (See Comments)  . Quinine Nausea And Vomiting    Labs:  Results for orders placed or performed during the hospital encounter of 10/26/16 (from the past 48 hour(s))  Glucose, capillary     Status: Abnormal   Collection Time: 11/13/16 12:03 PM  Result Value Ref Range   Glucose-Capillary 214 (H) 65 - 99 mg/dL  Glucose, capillary     Status: Abnormal   Collection Time: 11/13/16  5:05 PM  Result Value Ref Range   Glucose-Capillary 150 (H) 65 - 99 mg/dL  Glucose, capillary     Status: Abnormal   Collection Time: 11/13/16  8:51 PM  Result Value Ref Range   Glucose-Capillary 224 (H) 65 - 99 mg/dL  Glucose, capillary     Status: Abnormal   Collection Time: 11/14/16 12:19 AM  Result Value Ref Range   Glucose-Capillary 256 (H) 65 - 99 mg/dL  Glucose, capillary     Status: Abnormal   Collection Time: 11/14/16  7:19 AM  Result Value Ref Range   Glucose-Capillary 170 (H) 65 - 99 mg/dL   Comment 1 Notify RN   Glucose, capillary     Status: Abnormal   Collection Time: 11/14/16 11:33 AM  Result Value Ref Range   Glucose-Capillary 194 (H) 65 - 99 mg/dL   Comment 1 Notify RN   Glucose, capillary     Status: None   Collection Time: 11/14/16  4:44 PM  Result Value Ref Range   Glucose-Capillary 76 65 - 99 mg/dL   Comment 1 Notify RN   Glucose, capillary     Status: Abnormal   Collection Time: 11/14/16  9:17 PM  Result Value Ref Range   Glucose-Capillary 180 (H) 65 - 99 mg/dL  Glucose, capillary     Status: Abnormal   Collection Time: 11/15/16  7:35 AM  Result Value Ref Range   Glucose-Capillary 346 (H) 65 - 99 mg/dL    Current Facility-Administered Medications  Medication Dose Route Frequency Provider Last Rate Last Dose  . acetaminophen (TYLENOL) tablet 650 mg  650 mg Oral Q6H PRN Ihor Austin, MD   650 mg at 11/09/16  1049   Or  . acetaminophen (TYLENOL) suppository 650 mg  650 mg Rectal Q6H PRN Ihor Austin, MD      . amLODipine (NORVASC) tablet 2.5 mg  2.5 mg Oral Daily Pyreddy, Pavan, MD   2.5 mg at 11/15/16 0825  . docusate sodium (COLACE) capsule 100 mg  100 mg Oral BID Auburn Bilberry, MD   100 mg at 11/15/16 0826  . enoxaparin (LOVENOX) injection 40 mg  40 mg Subcutaneous Q24H Ihor Austin, MD   40 mg at 11/15/16 0824  . fluticasone (FLONASE) 50 MCG/ACT nasal spray 2 spray  2 spray Each Nare QHS Marguarite Arbour, MD   2 spray at 11/14/16 2043  . HYDROcodone-acetaminophen (NORCO) 10-325 MG per tablet 1 tablet  1 tablet Oral Q6H PRN Ihor Austin, MD   1 tablet at 11/15/16 1111  . insulin aspart (novoLOG) injection 0-15 Units  0-15 Units Subcutaneous TID WC Ihor Austin, MD   11 Units at 11/15/16 0825  . insulin aspart (novoLOG) injection 0-5 Units  0-5 Units Subcutaneous QHS Ihor Austin, MD   3 Units at 11/14/16 0027  . insulin glargine (LANTUS) injection 30 Units  30 Units Subcutaneous Daily Katha Hamming, MD   30 Units at 11/15/16 0825  . LORazepam (ATIVAN) tablet 0.5 mg  0.5 mg Oral Q8H PRN Ihor Austin, MD   0.5 mg at 11/11/16 0246  . losartan (COZAAR) tablet 100 mg  100 mg Oral Daily Pyreddy, Vivien Rota, MD   100 mg at 11/15/16 0827  . ondansetron (ZOFRAN) tablet 4 mg  4 mg Oral Q6H PRN Ihor Austin, MD   4 mg at 10/29/16 2048   Or  . ondansetron (ZOFRAN) injection 4 mg  4 mg Intravenous Q6H PRN Pyreddy, Pavan, MD      . risperiDONE (RISPERDAL M-TABS) disintegrating tablet 2 mg  2 mg Oral BID Harper Vandervoort, Jackquline Denmark, MD      . senna-docusate (Senokot-S) tablet 1 tablet  1 tablet Oral QHS PRN Ihor Austin, MD        Musculoskeletal: Strength & Muscle Tone: within normal limits Gait & Station: unsteady Patient leans: N/A  Psychiatric Specialty Exam: Physical Exam  Nursing note  and vitals reviewed. Constitutional: He appears well-developed and well-nourished.  HENT:  Head:  Normocephalic and atraumatic.  Eyes: Pupils are equal, round, and reactive to light. Conjunctivae are normal.  Neck: Normal range of motion.  Cardiovascular: Regular rhythm and normal heart sounds.   Respiratory: Effort normal. No respiratory distress.  GI: Soft.  Musculoskeletal: Normal range of motion.  Neurological: He is alert.  Skin: Skin is warm and dry.  Psychiatric: He has a normal mood and affect. Thought content normal. His affect is not labile and not inappropriate. His speech is tangential. His speech is not rapid and/or pressured. Thought content is not paranoid. Cognition and memory are impaired. He does not express impulsivity or inappropriate judgment. He expresses no homicidal and no suicidal ideation. He exhibits abnormal recent memory.    Review of Systems  Constitutional: Negative.   HENT: Negative.   Eyes: Negative.   Respiratory: Negative.   Cardiovascular: Negative.   Gastrointestinal: Negative.   Musculoskeletal: Negative.   Skin: Negative.   Neurological: Negative.   Psychiatric/Behavioral: Negative for depression, hallucinations, memory loss, substance abuse and suicidal ideas. The patient is not nervous/anxious and does not have insomnia.    [facility] Face-to-Face Psychiatry Consult   Reason for Consult:  Consult for 83 year old man who was brought back to the hospital almost immediately after discharge because of his agitated behavior that prevented placement. Referring Physician:  Sudini Patient Identification: Jesus Holland MRN:  [id number] Principal Diagnosis: Psychosis [facility]) Diagnosis:   Patient Active Problem List   Diagnosis Date Noted  . Psychosis (HCC) [F29] 10/29/2016  . Hyperglycemia [R73.9] 10/23/2016  . Dementia, Alzheimer's, with behavior disturbance [G30.9, F02.81] 10/23/2016    Total Time spent with patient: 15 minutes  Subjective:   Jesus Holland is a 83 y.o. male patient admitted with "get out of here! I want nothing to do with you! I  am just here to teach the music!".  HPI:  Patient interviewed chart reviewed. I saw this gentleman last week in the hospital as well. Last time he was here in the hospital he had presented with a very elevated blood sugar and with agitated oppositional behavior when the Department of Social Services tried to place him at the [place]. My evaluation last week was that this was a patient with dementia probably mixed vascular and Alzheimer's with psychotic symptoms and behavioral disturbance. I had taken him off commitment and suggested he be discharged and placed. Apparently this was attempted and once again the patient immediately became physically aggressive and disruptive to the point that it was impossible to place him and once again he came back to the hospital with a blood sugar of almost 700. I tried to speak with him this evening and the patient became agitated almost immediately upon seeing me. I tried to engage him in some conversation about his medical history and he just escalated more and more. Refused to listen to any reason from me or from any of the nursing aides patient appeared to be very confused at some points. 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 This appeared to refer to a conversation that he and the nurse had been having about a piece of music. He was able to have brief bits of conversation that were lucid but then became emotionally very ramped up and I could not get him to calm down.  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 I have not spoken to any family and I'm not aware of any are currently involved. Patient is insisting that he will go back to his own home although it has been explained to him repeatedly that he is no longer able to make decisions for himself.  Medical history:  Diabetes. Tried to get him to engage in some conversation about it but he gets so agitated I can't even tell how much he understands his illness.  Substance abuse history: Nonidentified  Past Psychiatric History: Patient does not have any known past psychiatric history that I have been able to identify although after interacting with him today and speaking with the hospitalist I am wondering more as to whether he might have an underlying bipolar disorder or other kind of mental illness since his behavior and presentation seems atypical for simply being demented.  Risk to Self: Is patient at risk for suicide?: No Risk to Others:   Prior Inpatient Therapy:   Prior Outpatient Therapy:    Past Medical History:  Past Medical History:  Diagnosis Date  . Arthritis   . Diabetes mellitus without complication [facility])     Past Surgical History:  Procedure Laterality Date  . none     Family History:  Family History  Problem Relation Age of Onset  . Family history unknown: Yes   Family Psychiatric  History: Unknown Social History:  History  Alcohol Use No     History  Drug Use No    Social History   Social History  . Marital status: Single    Spouse name: N/A  . Number of children: N/A  . Years of education: N/A   Occupational History  . retired    Social History Main Topics  . Smoking status: Never Smoker  . Smokeless tobacco: Never Used  . Alcohol use No  . Drug use: No  . Sexual activity: Not Asked   Other Topics Concern  . None   Social History Narrative  . None   Additional Social History:    Allergies:   Allergies  Allergen Reactions  . Aspirin Nausea And Vomiting  . Escitalopram Other (See Comments)  . Metformin Other (See Comments)  . Quinine Nausea And Vomiting    Labs:  Results for orders placed or performed during the hospital encounter of 10/26/16 (from the past 48 hour(s))  Glucose, capillary     Status: Abnormal   Collection Time: 11/13/16 12:03  PM  Result Value Ref Range   Glucose-Capillary 214 (H) 65 - 99 mg/dL  Glucose, capillary     Status: Abnormal   Collection Time: 11/13/16  5:05 PM  Result Value Ref Range   Glucose-Capillary 150 (H) 65 - 99 mg/dL  Glucose, capillary     Status: Abnormal   Collection Time: 11/13/16  8:51 PM  Result Value Ref Range   Glucose-Capillary 224 (H) 65 - 99 mg/dL  Glucose, capillary     Status: Abnormal   Collection Time: 11/14/16 12:19 AM  Result Value Ref Range   Glucose-Capillary 256 (H) 65 - 99 mg/dL  Glucose, capillary     Status: Abnormal   Collection Time: 11/14/16  7:19 AM  Result Value Ref Range   Glucose-Capillary 170 (H) 65 - 99 mg/dL   Comment 1 Notify RN   Glucose, capillary     Status: Abnormal   Collection Time: 11/14/16  11:33 AM  Result Value Ref Range   Glucose-Capillary 194 (H) 65 - 99 mg/dL   Comment 1 Notify RN   Glucose, capillary     Status: None   Collection Time: 11/14/16  4:44 PM  Result Value Ref Range   Glucose-Capillary 76 65 - 99 mg/dL   Comment 1 Notify RN   Glucose, capillary     Status: Abnormal   Collection Time: 11/14/16  9:17 PM  Result Value Ref Range   Glucose-Capillary 180 (H) 65 - 99 mg/dL  Glucose, capillary     Status: Abnormal   Collection Time: 11/15/16  7:35 AM  Result Value Ref Range   Glucose-Capillary 346 (H) 65 - 99 mg/dL    Current Facility-Administered Medications  Medication Dose Route Frequency Provider Last Rate Last Dose  . acetaminophen (TYLENOL) tablet 650 mg  650 mg Oral Q6H PRN Ihor Austin, MD   650 mg at 11/09/16 1049   Or  . acetaminophen (TYLENOL) suppository 650 mg  650 mg Rectal Q6H PRN Ihor Austin, MD      . amLODipine (NORVASC) tablet 2.5 mg  2.5 mg Oral Daily Pyreddy, Pavan, MD   2.5 mg at 11/15/16 0825  . docusate sodium (COLACE) capsule 100 mg  100 mg Oral BID Auburn Bilberry, MD   100 mg at 11/15/16 0826  . enoxaparin (LOVENOX) injection 40 mg  40 mg Subcutaneous Q24H Ihor Austin, MD   40 mg at  11/15/16 0824  . fluticasone (FLONASE) 50 MCG/ACT nasal spray 2 spray  2 spray Each Nare QHS Marguarite Arbour, MD   2 spray at 11/14/16 2043  . HYDROcodone-acetaminophen (NORCO) 10-325 MG per tablet 1 tablet  1 tablet Oral Q6H PRN Ihor Austin, MD   1 tablet at 11/15/16 1111  . insulin aspart (novoLOG) injection 0-15 Units  0-15 Units Subcutaneous TID WC Ihor Austin, MD   11 Units at 11/15/16 0825  . insulin aspart (novoLOG) injection 0-5 Units  0-5 Units Subcutaneous QHS Ihor Austin, MD   3 Units at 11/14/16 0027  . insulin glargine (LANTUS) injection 30 Units  30 Units Subcutaneous Daily Katha Hamming, MD   30 Units at 11/15/16 0825  . LORazepam (ATIVAN) tablet 0.5 mg  0.5 mg Oral Q8H PRN Ihor Austin, MD   0.5 mg at 11/11/16 0246  . losartan (COZAAR) tablet 100 mg  100 mg Oral Daily Pyreddy, Vivien Rota, MD   100 mg at 11/15/16 0827  . ondansetron (ZOFRAN) tablet 4 mg  4 mg Oral Q6H PRN Ihor Austin, MD   4 mg at 10/29/16 2048   Or  . ondansetron (ZOFRAN) injection 4 mg  4 mg Intravenous Q6H PRN Pyreddy, Pavan, MD      . risperiDONE (RISPERDAL M-TABS) disintegrating tablet 2 mg  2 mg Oral BID Severino Paolo, Jackquline Denmark, MD      . senna-docusate (Senokot-S) tablet 1 tablet  1 tablet Oral QHS PRN Ihor Austin, MD        Musculoskeletal: Strength & Muscle Tone: within normal limits Gait & Station: normal Patient leans: N/A  Psychiatric Specialty Exam: Physical Exam  Nursing note and vitals reviewed. Constitutional: He appears well-developed and well-nourished.  HENT:  Head: Normocephalic and atraumatic.  Eyes: Pupils are equal, round, and reactive to light. Conjunctivae are normal.  Neck: Normal range of motion.  Cardiovascular: Regular rhythm and normal heart sounds.   Respiratory: Effort normal. No respiratory distress.  GI: Soft.  Musculoskeletal: Normal range of motion.  Neurological: He is alert.  Skin: Skin is warm and dry.  Psychiatric: His affect is labile and  inappropriate. His speech is rapid and/or pressured and tangential. He is agitated. Thought content is paranoid. Cognition and memory are impaired. He expresses impulsivity and inappropriate judgment. He expresses no homicidal and no suicidal ideation. He exhibits abnormal recent memory.    Review of Systems  Constitutional: Negative.   HENT: Negative.   Eyes: Negative.   Respiratory: Negative.   Cardiovascular: Negative.   Gastrointestinal: Negative.   Musculoskeletal: Negative.   Skin: Negative.   Neurological: Negative.   Psychiatric/Behavioral: Negative for depression, hallucinations, memory loss, substance abuse and suicidal ideas. The patient is not nervous/anxious and does not have insomnia.     Blood pressure (!) 147/67, pulse (!) 102, temperature 98.2 F (36.8 C), temperature source Oral, resp. rate 18, height  (1.727 m), weight 76.4 kg (168 lb 6.4 oz), SpO2 96 %.Body mass index is 25.61 kg/m.  General Appearance: Casual  Eye Contact:  Fair  Speech:  Pressured  Volume:  Increased  Mood:  Angry  Affect:  Congruent and Inappropriate  Thought Process:  Disorganized  Orientation:  Negative  Thought Content:  Logical  Suicidal Thoughts:  No  Homicidal Thoughts:  No  Memory:  Negative  Judgement:  Fair  Insight:  Lacking  Psychomotor Activity:  Normal  Concentration:  Concentration: Poor  Recall:  Poor  Fund of Knowledge:  Poor  Language:  Fair  Akathisia:  No  Handed:  Right  AIMS (if indicated):     Assets:  Communication Skills  ADL's:  Impaired  Cognition:  Impaired,  Mild  Sleep:        Treatment Plan Summary: Daily contact with patient to assess and evaluate symptoms and progress in treatment, Medication management and Plan At this point I am more convinced that the patient probably does need psychiatric treatment. His behavior is irrational and not showing any signs of improving. If anything it is getting worse. I agreed with the hospitalist that the  patient probably should be started on some medicine and have started risperidone. I started with a low dose but if he is not oversedated we can try to titrate that up. Under the current circumstances I can't see how we can discontinue the IVC given the failure to place him twice in a row. I appreciate social work's attempts to refer him to [facility] which I think is probably the best course for now. I will try to follow up as best I can.  Disposition: Recommend psychiatric Inpatient admission when medically cleared. Supportive therapy provided about ongoing stressors.  Mordecai Rasmussen, MD 11/15/2016 11:32 AM  Blood pressure (!) 147/67, pulse (!) 102, temperature 98.2 F (36.8 C), temperature source Oral, resp. rate 18, height  (1.727 m), weight 76.4 kg (168 lb 6.4 oz), SpO2 96 %.Body mass index is 25.61 kg/m.  General Appearance: Casual  Eye Contact:  Fair  Speech:  Pressured  Volume:  Increased  Mood:  Angry  Affect:  Congruent and Inappropriate  Thought Process:  Disorganized  Orientation:  Negative  Thought Content:  Illogical, Paranoid Ideation and Tangential  Suicidal Thoughts:  No  Homicidal Thoughts:  No  Memory:  Negative  Judgement:  Poor  Insight:  Lacking  Psychomotor Activity:  Restlessness  Concentration:  Concentration: Poor  Recall:  Poor  Fund of Knowledge:  Poor  Language:  Fair  Akathisia:  No  Handed:  Right  AIMS (if indicated):  Assets:  Communication Skills  ADL's:  Impaired  Cognition:  Impaired,  Mild  Sleep:        Treatment Plan Summary: Daily contact with patient to assess and evaluate symptoms and progress in treatment, Medication management and Plan At last patient seems to have stabilized to the point that acceptable discharge planning can proceed. I have taken him off of involuntary commitment. I have also increased the Risperdal dose from 1 mg 3 times a day to 2 mg twice a day and a small increase which I hope will help  with his occasional agitation while still being tolerable. Spoke with patient who understands and agrees to the plan. Discontinue IVC. No longer needs inpatient treatment if this level of appropriate discharge is in place.  Disposition: Patient does not meet criteria for psychiatric inpatient admission. Supportive therapy provided about ongoing stressors.  Mordecai Rasmussen, MD 11/15/2016 11:32 AM

## 2016-11-15 NOTE — Clinical Social Work Note (Signed)
CSW spoke with Cleophas Dunker with [employer] and coordinated her picking patient up today to take him to [facility]. Patient will have some furniture and a television. Marylene Land will be talking to the patient and getting his favorite foods and drinks that are diet appropriate and will go to the grocery store to get them. Patient will have a nurse's aid to come in 3 times a day to check blood sugars and ensure medications are being taken appropriately as well as bathing and cleaning of patient's [facility] apartment and laundering of patient's clothes. CSW sat down with patient today and informed him of the plan and patient's reaction was one of much happiness and he gave CSW a big hug and started crying due to being so happy. Patient will also have home health as set up by RN CM. Prior to patient discharging, patient's involuntary commitment was rescinded and the form was faxed to the clerk of court.  [name] MSW,LCSW [phone]

## 2016-11-17 ENCOUNTER — Emergency Department
Admission: EM | Admit: 2016-11-17 | Discharge: 2016-11-17 | Disposition: A | Discharge status: Home / Self Care | Payer: Medicare Other | Attending: Emergency Medicine | Admitting: Emergency Medicine

## 2016-11-17 ENCOUNTER — Emergency Department: Payer: Medicare Other

## 2016-11-17 DIAGNOSIS — Z794 Long term (current) use of insulin: Secondary | ICD-10-CM | POA: Insufficient documentation

## 2016-11-17 DIAGNOSIS — M545 Low back pain: Secondary | ICD-10-CM | POA: Insufficient documentation

## 2016-11-17 DIAGNOSIS — G309 Alzheimer's disease, unspecified: Secondary | ICD-10-CM | POA: Insufficient documentation

## 2016-11-17 DIAGNOSIS — G8929 Other chronic pain: Secondary | ICD-10-CM

## 2016-11-17 DIAGNOSIS — Z79899 Other long term (current) drug therapy: Secondary | ICD-10-CM | POA: Insufficient documentation

## 2016-11-17 DIAGNOSIS — E119 Type 2 diabetes mellitus without complications: Secondary | ICD-10-CM | POA: Insufficient documentation

## 2016-11-17 LAB — URINALYSIS, ROUTINE W REFLEX MICROSCOPIC
Bacteria, UA: NONE SEEN
Bilirubin Urine: NEGATIVE
HGB URINE DIPSTICK: NEGATIVE
KETONES UR: NEGATIVE mg/dL
Leukocytes, UA: NEGATIVE
NITRITE: NEGATIVE
PH: 6 (ref 5.0–8.0)
PROTEIN: 100 mg/dL — AB
RBC / HPF: NONE SEEN RBC/hpf (ref 0–5)
Specific Gravity, Urine: 1.012 (ref 1.005–1.030)

## 2016-11-17 LAB — BASIC METABOLIC PANEL
ANION GAP: 7 (ref 5–15)
BUN: 19 mg/dL (ref 6–20)
CHLORIDE: 108 mmol/L (ref 101–111)
CO2: 23 mmol/L (ref 22–32)
Calcium: 8.7 mg/dL — ABNORMAL LOW (ref 8.9–10.3)
Creatinine, Ser: 1.08 mg/dL (ref 0.61–1.24)
Glucose, Bld: 280 mg/dL — ABNORMAL HIGH (ref 65–99)
POTASSIUM: 3.6 mmol/L (ref 3.5–5.1)
SODIUM: 138 mmol/L (ref 135–145)

## 2016-11-17 LAB — CBC
HCT: 36.3 % — ABNORMAL LOW (ref 40.0–52.0)
HEMOGLOBIN: 12.6 g/dL — AB (ref 13.0–18.0)
MCH: 31.1 pg (ref 26.0–34.0)
MCHC: 34.7 g/dL (ref 32.0–36.0)
MCV: 89.6 fL (ref 80.0–100.0)
Platelets: 252 10*3/uL (ref 150–440)
RBC: 4.05 MIL/uL — AB (ref 4.40–5.90)
RDW: 13.4 % (ref 11.5–14.5)
WBC: 7.2 10*3/uL (ref 3.8–10.6)

## 2016-11-17 MED ORDER — SODIUM CHLORIDE 0.9 % IV BOLUS (SEPSIS)
1000.0000 mL | Freq: Once | INTRAVENOUS | Status: AC
  Administered 2016-11-17: 1000 mL via INTRAVENOUS

## 2016-11-17 MED ORDER — ACETAMINOPHEN 325 MG PO TABS: 650.0000 mg | ORAL_TABLET | Freq: Four times a day (QID) | ORAL | 0 refills | Status: DC | PRN

## 2016-11-17 NOTE — ED Notes (Signed)
ED Provider at bedside. 

## 2016-11-17 NOTE — Discharge Summary (Signed)
Jesus Holland, is a 83 y.o. male  DOB [birth year]  MRN [id number].  Admission date:  10/26/2016  Admitting Physician  Ihor Austin, MD  Discharge Date:  11/15/2016   Primary MD  Marguarite Arbour, MD  Recommendations for primary care physician for things to follow:  Discharged  To [facility].   Admission Diagnosis  Aggressive behavior [R46.89] Hyperglycemia [R73.9] Involuntary commitment [Z04.6]   Discharge Diagnosis  Aggressive behavior [R46.89] Hyperglycemia [R73.9] Involuntary commitment [Z04.6]    Principal Problem:   Psychosis (HCC) Active Problems:   Hyperglycemia      Past Medical History:  Diagnosis Date  . Arthritis   . Diabetes mellitus without complication [facility])     Past Surgical History:  Procedure Laterality Date  . none         History of present illness and  Hospital Course:     Kindly see H&P for history of present illness and admission details, please review complete Labs, Consult reports and Test reports for all details in brief  HPI  from the history and physical done on the day of admission 83 yr old male admitted for  Agitated behavior and hyperglycemia,with BG more than 700.he is admitted on 9/15 and discharged on 11/15/16.Marland Kitchenpt kept complaining some one took his dogs away.he is made IVC by psych,admitted to medicine for DMII with uncontrolled  Hyperglycemia.   Hospital Course  1.Uncontrolled DMII,with dehydration and hyponatremia; Hyperglycemia on admission due to missing insulin and labile eating habits.seen by diabetic nurse.recommended lantus 30 units,trulicity0.75mg   Once weekly.discharged [facility].Arrnaged home health Nursing to come thrice a week to help with diabetes management . 2.Alzeimers Dementia with psychotic symptoms; ;seen  by Psych.made IVC till he ws here and on the day of discharge IVC is discontinued by psych.Risperidol dose hs been titrated ,and he is discharged with Risperdal 2 mg po BID.  Discharge Condition:stable   Follow UP      Discharge Instructions  and  Discharge Medications    Discharge Instructions    Face-to-face encounter (required for Medicare/Medicaid patients)    Complete by:  As directed    I Olando Willems certify that this patient is under my care and that I, or a nurse practitioner or physician's assistant working with me, had a face-to-face encounter that meets the physician face-to-face encounter requirements with this patient on 11/15/2016.  Diabetes mellitus type 2: Dementia   The encounter with the patient was in whole, or in part, for the following medical condition, which is the primary reason for home health care:  whole   I certify that, based on my findings, the following services are medically necessary home health services:  Nursing   Reason for Medically Necessary Home Health Services:  Skilled Nursing- Teaching of Disease Process/Symptom Management   My clinical findings support the need for the above services:  Cognitive impairments, dementia, or mental confusion  that make it unsafe to leave home   Further, I certify that my clinical findings support that this patient is homebound due to:  Mental confusion   Home Health    Complete by:  As directed    To provide the following care/treatments:  RN     Allergies as of 11/15/2016      Reactions   Aspirin Nausea And Vomiting   Escitalopram Other (See Comments)   Metformin Other (See Comments)   Quinine Nausea And Vomiting      Medication List    STOP taking  these medications   HYDROcodone-acetaminophen 10-325 MG tablet Commonly known as:  NORCO   LANTUS SOLOSTAR 100 UNIT/ML Solostar Pen Generic drug:  Insulin Glargine Replaced by:  insulin glargine 100 UNIT/ML injection     TAKE these medications    amLODipine 2.5 MG tablet Commonly known as:  NORVASC Take 1 tablet (2.5 mg total) by mouth daily.   insulin glargine 100 UNIT/ML injection Commonly known as:  LANTUS Inject 0.3 mLs (30 Units total) into the skin daily. Replaces:  LANTUS SOLOSTAR 100 UNIT/ML Solostar Pen   LORazepam 0.5 MG tablet Commonly known as:  ATIVAN Take 1 tablet (0.5 mg total) by mouth every 8 (eight) hours as needed for anxiety.   losartan 100 MG tablet Commonly known as:  COZAAR Take 1 tablet (100 mg total) by mouth daily.   risperiDONE 2 MG disintegrating tablet Commonly known as:  RISPERDAL M-TABS Take 1 tablet (2 mg total) by mouth 2 (two) times daily.         Diet and Activity recommendation: See Discharge Instructions above   Consults obtained -psychiatry   Major procedures and Radiology Reports - PLEASE review detailed and final reports for all details, in brief -     Dg Chest Port 1 View  Addendum Date: 10/26/2016   ADDENDUM REPORT: 10/26/2016 14:19 ADDENDUM: There is no radiographic evidence of tuberculosis. Electronically Signed   By: Lupita Raider, M.D.   On: 10/26/2016 14:19   Result Date: 10/26/2016 CLINICAL DATA:  Possible tuberculosis. EXAM: PORTABLE CHEST 1 VIEW COMPARISON:  Radiographs of January 07, 2012. FINDINGS: The heart size and mediastinal contours are within normal limits. Both lungs are clear. Atherosclerosis of thoracic aorta is noted. The visualized skeletal structures are unremarkable. IMPRESSION: No acute cardiopulmonary abnormality seen.  Aortic atherosclerosis. Electronically Signed: By: Lupita Raider, M.D. On: 10/26/2016 13:57    Micro Results    No results found for this or any previous visit (from the past 240 hour(s)).     Today   Subjective:   Treyton Slimp today has no headache,no chest abdominal pain,no new weakness tingling or numbness, feels much better wants to go home today.   Objective:   Blood pressure (!) 147/67, pulse (!) 102,  temperature 98.2 F (36.8 C), temperature source Oral, resp. rate 18, height  (1.727 m), weight 76.4 kg (168 lb 6.4 oz), SpO2 96 %.  No intake or output data in the 24 hours ending 11/17/16 1554  Exam Awake Alert, Oriented x 3, No new F.N deficits, Normal affect [place].AT,PERRAL Supple Neck,No JVD, No cervical lymphadenopathy appriciated.  Symmetrical Chest wall movement, Good air movement bilaterally, CTAB RRR,No Gallops,Rubs or new Murmurs, No Parasternal Heave +ve B.Sounds, Abd Soft, Non tender, No organomegaly appriciated, No rebound -guarding or rigidity. No Cyanosis, Clubbing or edema, No new Rash or bruise  Data Review   CBC w Diff:  Lab Results  Component Value Date   WBC 6.9 11/09/2016   HGB 12.9 (L) 11/09/2016   HGB 14.0 01/07/2012   HCT 37.8 (L) 11/09/2016   HCT 40.8 01/07/2012   PLT 275 11/09/2016   PLT 217 01/07/2012   LYMPHOPCT 18.5 09/23/2011   MONOPCT 7.4 09/23/2011   EOSPCT 1.7 09/23/2011   BASOPCT 0.4 09/23/2011    CMP:  Lab Results  Component Value Date   NA 140 10/30/2016   NA 139 06/25/2013   K 4.5 10/30/2016   K 3.9 06/25/2013   CL 107 10/30/2016   CL 106 06/25/2013  CO2 27 10/30/2016   CO2 28 06/25/2013   BUN 27 (H) 10/30/2016   BUN 16 06/25/2013   CREATININE 1.03 11/10/2016   CREATININE 0.92 06/25/2013   PROT 7.3 10/26/2016   PROT 7.1 06/25/2013   ALBUMIN 3.9 10/26/2016   ALBUMIN 3.6 06/25/2013   BILITOT 0.5 10/26/2016   BILITOT 0.3 06/25/2013   ALKPHOS 111 10/26/2016   ALKPHOS 75 06/25/2013   AST 22 10/26/2016   AST 11 (L) 06/25/2013   ALT 15 (L) 10/26/2016   ALT 20 06/25/2013  .   Total Time in preparing paper work, data evaluation and todays exam - 35 minutes  Shanine Kreiger M.D on 11/15/2016 at 3:54 PM    Note: This dictation was prepared with Dragon dictation along with smaller phrase technology. Any transcriptional errors that result from this process are unintentional.

## 2016-11-17 NOTE — ED Notes (Addendum)
Pt is under DSS care - his case worker is Cleophas Dunker [phone] She called to check on pt and to give information that pt has recently been IVC'd and that he also has a narcotic addiction history - She reports that the provider can call her with any questions Dr Scotty Court notified

## 2016-11-17 NOTE — ED Notes (Signed)
Pt refused to sign discharge paper work - he was transported by ems back to [facility]

## 2016-11-17 NOTE — ED Provider Notes (Signed)
[facility] Emergency Department Provider Note  ____________________________________________  Time seen: Approximately 5:18 PM  I have reviewed the triage vital signs and the nursing notes.   HISTORY  Chief Complaint Hip Pain  Level 5 Caveat: Portions of the History and Physical are unable to be obtained due to patient being a poor historian   HPI Jesus Holland is a 83 y.o. male comes to the ED complaining of chronic low back pain and bilateral hip pain is been present for many months. He used to take Vicodin for this, but after a recent hospitalization partly related to opioid overdose, his Vicodin has not been refilled. He reports he lives at [facility], and was sent to the ED "because they close at 6:00".  patient denies any acute complaints. No chest pain shortness of breath headache vision changes falls or trauma. No fevers chills vomiting diarrhea or constipation. Denies abdominal pain.   Past Medical History:  Diagnosis Date  . Arthritis   . Diabetes mellitus without complication [facility])      Patient Active Problem List   Diagnosis Date Noted  . Psychosis (HCC) 10/29/2016  . Hyperglycemia 10/23/2016  . Dementia, Alzheimer's, with behavior disturbance 10/23/2016     Past Surgical History:  Procedure Laterality Date  . none       Prior to Admission medications   Medication Sig Start Date End Date Taking? Authorizing Provider  acetaminophen (TYLENOL) 325 MG tablet Take 2 tablets (650 mg total) by mouth every 6 (six) hours as needed. 11/17/16   Sharman Cheek, MD  amLODipine (NORVASC) 2.5 MG tablet Take 1 tablet (2.5 mg total) by mouth daily. 11/12/16   Katha Hamming, MD  insulin glargine (LANTUS) 100 UNIT/ML injection Inject 0.3 mLs (30 Units total) into the skin daily. 11/16/16   Katha Hamming, MD  LORazepam (ATIVAN) 0.5 MG tablet Take 1 tablet (0.5 mg total) by mouth every 8 (eight) hours as needed for  anxiety. 11/12/16   Katha Hamming, MD  losartan (COZAAR) 100 MG tablet Take 1 tablet (100 mg total) by mouth daily. 11/13/16   Katha Hamming, MD  risperiDONE (RISPERDAL M-TABS) 2 MG disintegrating tablet Take 1 tablet (2 mg total) by mouth 2 (two) times daily. 11/15/16   Clapacs, Jackquline Denmark, MD     Allergies Aspirin; Escitalopram; Metformin; and Quinine   Family History  Problem Relation Age of Onset  . Family history unknown: Yes    Social History Social History  Substance Use Topics  . Smoking status: Never Smoker  . Smokeless tobacco: Never Used  . Alcohol use No    Review of Systems  Constitutional:   No fever or chills.  ENT:   No sore throat. No rhinorrhea. Cardiovascular:   No chest pain or syncope. Respiratory:   No dyspnea or cough. Gastrointestinal:   Negative for abdominal pain, vomiting and diarrhea.  Musculoskeletal:   chronic low back pain and bilateral hip pain as above. All other systems reviewed and are negative except as documented above in ROS and HPI.  ____________________________________________   PHYSICAL EXAM:  VITAL SIGNS: ED Triage Vitals  Enc Vitals Group     BP 11/17/16 1705 (!) 146/96     Pulse Rate 11/17/16 1705 (!) 101     Resp 11/17/16 1705 14     Temp 11/17/16 1705 98.2 F (36.8 C)     Temp Source 11/17/16 1705 Oral     SpO2 11/17/16 1705 96 %     Weight 11/17/16  1705 172 lb (78 kg)     Height 11/17/16 1705  (1.778 m)     Head Circumference --      Peak Flow --      Pain Score 11/17/16 1704 9     Pain Loc --      Pain Edu? --      Excl. in GC? --     Vital signs reviewed, nursing assessments reviewed.   Constitutional:   Alert and oriented to person and place. Well appearing and in no distress. Eyes:   No scleral icterus.  EOMI. No nystagmus. No conjunctival pallor. PERRL. ENT   Head:   Normocephalic and atraumatic.   Nose:   No congestion/rhinnorhea.    Mouth/Throat:   MMM, no pharyngeal erythema.  No peritonsillar mass.    Neck:   No meningismus. Full ROM. Hematological/Lymphatic/Immunilogical:   No cervical lymphadenopathy. Cardiovascular:   tachycardia heart rate 100. Symmetric bilateral radial and DP pulses.  No murmurs.  Respiratory:   Normal respiratory effort without tachypnea/retractions. Breath sounds are clear and equal bilaterally. No wheezes/rales/rhonchi. Gastrointestinal:   Soft and nontender. Non distended. There is no CVA tenderness.  No rebound, rigidity, or guarding. Genitourinary:   deferred Musculoskeletal:   Normal range of motion in all extremities. No joint effusions.  No lower extremity tenderness.  No edema. Neurologic:   Normal speech and language.  Motor grossly intact. No gross focal neurologic deficits are appreciated.  Skin:    Skin is warm, dry and intact. No rash noted.  No petechiae, purpura, or bullae.no visible bruising  ____________________________________________    LABS (pertinent positives/negatives) (all labs ordered are listed, but only abnormal results are displayed) Labs Reviewed  CBC - Abnormal; Notable for the following:       Result Value   RBC 4.05 (*)    Hemoglobin 12.6 (*)    HCT 36.3 (*)    All other components within normal limits  BASIC METABOLIC PANEL - Abnormal; Notable for the following:    Glucose, Bld 280 (*)    Calcium 8.7 (*)    All other components within normal limits  URINALYSIS, ROUTINE W REFLEX MICROSCOPIC - Abnormal; Notable for the following:    Color, Urine YELLOW (*)    APPearance CLEAR (*)    Glucose, UA >=500 (*)    Protein, ur 100 (*)    Squamous Epithelial / LPF 0-5 (*)    All other components within normal limits   ____________________________________________   EKG    ____________________________________________    RADIOLOGY  Dg Lumbar Spine 2-3 Views  Result Date: 11/17/2016 CLINICAL DATA:  Chronic low back and bilateral hip pain. EXAM: LUMBAR SPINE - 2-3 VIEW COMPARISON:  None.  FINDINGS: There is no evidence of lumbar spine fracture. There is normal lumbar segmentation with levoscoliosis. Lumbar spondylosis with multilevel mild-to-moderate disc space narrowing from L1 through S1, more marked at L5-S1. Associated facet joint space narrowing and sclerosis, more prominently noted from L4 through S1. Calcific aortic atherosclerosis without aneurysm. IMPRESSION: 1. Lumbar spondylosis with lower lumbar facet arthropathy. No acute osseous appearing abnormality. 2. Aortic atherosclerosis. Electronically Signed   By: Tollie Eth M.D.   On: 11/17/2016 17:51   Dg Pelvis 1-2 Views  Result Date: 11/17/2016 CLINICAL DATA:  83 year old male with history of chronic lower back and bilateral hip pain. EXAM: PELVIS - 1-2 VIEW COMPARISON:  No priors. FINDINGS: There is no evidence of pelvic fracture or diastasis. No pelvic bone lesions are seen. Mild  degenerative changes of osteoarthritis are noted in the hip joints bilaterally (left greater than right). IMPRESSION: 1. No acute radiographic abnormality of the bony pelvis. 2. Mild bilateral hip joint osteoarthritis (low-grade). Electronically Signed   By: Trudie Reed M.D.   On: 11/17/2016 17:49    ____________________________________________   PROCEDURES Procedures  ____________________________________________   INITIAL IMPRESSION / ASSESSMENT AND PLAN / ED COURSE  Pertinent labs & imaging results that were available during my care of the patient were reviewed by me and considered in my medical decision making (see chart for details).  @   patient well appearing no acute distress, complains of chronic musculoskeletal pain in the low back and hips. This is likely worsened due to running out of opioids. Patient reports compliance with his other medications, is calm and lucid and appears to be at his baseline. He is mildly tachycardic, compared to previous vital sign measurements that demonstrate a typical heart  rate of about 80-90.  despite the lack of acute complaints, we'll do a screening workup with serum labs, urinalysis, x-ray of lumbar spine and pelvisdue to a differential including dehydration, urinary tract infection, spontaneous fracture, or allegedly disturbance. I'll give IV fluids for hydration.  Clinical Course as of Nov 18 2018  Sat Nov 17, 2016  1840 Workup unremarkable. No evidence of acute fractures. Labs unremarkable. Still waiting for UA  [PS]    Clinical Course User Index [PS] Sharman Cheek, MD     ----------------------------------------- 8:20 PM on 11/17/2016 -----------------------------------------  Urinalysis unremarkable. No evidence of acute fracture of the lumbar spine or pelvis. No evidence of urinary tract infection, likely disturbance, acute renal failure. Discharge the patient with a prescription for Tylenol, follow up with primary care. We did receive a call from his DSS case manager who reported a concern that the patient may be engaging in some drug seeking behavior due to his history of opioid abuse.  ____________________________________________   FINAL CLINICAL IMPRESSION(S) / ED DIAGNOSES  Final diagnoses:  Chronic bilateral low back pain without sciatica      New Prescriptions   ACETAMINOPHEN (TYLENOL) 325 MG TABLET    Take 2 tablets (650 mg total) by mouth every 6 (six) hours as needed.     Portions of this note were generated with dragon dictation software. Dictation errors may occur despite best attempts at proofreading.    Sharman Cheek, MD 11/17/16 2021

## 2016-11-17 NOTE — Discharge Instructions (Signed)
Your blood and urine tests were unremarkable.  Your xrays did not show any new injuries. Take tylenol as needed for your pain. Follow up with your doctor for continued monitoring of your symptoms. Do not take more than 3000 milligrams of tylenol (acetaminophen) in a day.

## 2016-11-17 NOTE — ED Notes (Signed)
In and out cath performed by Tamala Fothergill and this nurse witnessed

## 2016-11-17 NOTE — ED Notes (Signed)
Spoke to pt Child psychotherapist to ask how pt would be able to function independently at home - she stated that the pt does fine when he is home alone and that he is "attention seeking" when he refuses to follow simple commands - informed her that pt had been incontinent and she stated that this was "how he acts when he thinks he will get attention"

## 2016-11-17 NOTE — ED Triage Notes (Signed)
Pt presents via EMS c/o bilateral hip pain, bilateral knee pain, and lower back pain. Pt states has had this pain for months. Denies new complaints.

## 2016-11-17 NOTE — ED Notes (Signed)
Pt had urinated and defecated on himself - pt and bed changed and cleaned

## 2016-11-23 ENCOUNTER — Emergency Department
Admission: EM | Admit: 2016-11-23 | Discharge: 2016-11-25 | Disposition: A | Discharge status: Other Acute Inpatient Hospital | Payer: Medicare Other | Attending: Emergency Medicine | Admitting: Emergency Medicine

## 2016-11-23 ENCOUNTER — Encounter: Payer: Self-pay | Admitting: Emergency Medicine

## 2016-11-23 DIAGNOSIS — Y999 Unspecified external cause status: Secondary | ICD-10-CM | POA: Diagnosis not present

## 2016-11-23 DIAGNOSIS — E1165 Type 2 diabetes mellitus with hyperglycemia: Secondary | ICD-10-CM | POA: Diagnosis not present

## 2016-11-23 DIAGNOSIS — F919 Conduct disorder, unspecified: Secondary | ICD-10-CM | POA: Diagnosis present

## 2016-11-23 DIAGNOSIS — W1830XA Fall on same level, unspecified, initial encounter: Secondary | ICD-10-CM | POA: Insufficient documentation

## 2016-11-23 DIAGNOSIS — F0281 Dementia in other diseases classified elsewhere with behavioral disturbance: Secondary | ICD-10-CM | POA: Diagnosis not present

## 2016-11-23 DIAGNOSIS — M533 Sacrococcygeal disorders, not elsewhere classified: Secondary | ICD-10-CM | POA: Diagnosis not present

## 2016-11-23 DIAGNOSIS — G308 Other Alzheimer's disease: Secondary | ICD-10-CM | POA: Insufficient documentation

## 2016-11-23 DIAGNOSIS — Y9389 Activity, other specified: Secondary | ICD-10-CM | POA: Insufficient documentation

## 2016-11-23 DIAGNOSIS — Y92239 Unspecified place in hospital as the place of occurrence of the external cause: Secondary | ICD-10-CM | POA: Diagnosis not present

## 2016-11-23 DIAGNOSIS — F0391 Unspecified dementia with behavioral disturbance: Secondary | ICD-10-CM

## 2016-11-23 DIAGNOSIS — F03918 Unspecified dementia, unspecified severity, with other behavioral disturbance: Secondary | ICD-10-CM

## 2016-11-23 LAB — COMPREHENSIVE METABOLIC PANEL
ALT: 14 U/L — AB (ref 17–63)
AST: 17 U/L (ref 15–41)
Albumin: 3.7 g/dL (ref 3.5–5.0)
Alkaline Phosphatase: 79 U/L (ref 38–126)
Anion gap: 8 (ref 5–15)
BUN: 18 mg/dL (ref 6–20)
CHLORIDE: 109 mmol/L (ref 101–111)
CO2: 22 mmol/L (ref 22–32)
CREATININE: 0.96 mg/dL (ref 0.61–1.24)
Calcium: 8.7 mg/dL — ABNORMAL LOW (ref 8.9–10.3)
GFR calc Af Amer: >60 mL/min (ref >60)
GFR calc non Af Amer: >60 mL/min (ref >60)
Glucose, Bld: 302 mg/dL — ABNORMAL HIGH (ref 65–99)
POTASSIUM: 3.7 mmol/L (ref 3.5–5.1)
SODIUM: 139 mmol/L (ref 135–145)
Total Bilirubin: 0.6 mg/dL (ref 0.3–1.2)
Total Protein: 6.8 g/dL (ref 6.5–8.1)

## 2016-11-23 LAB — CBC
HCT: 38.2 % — ABNORMAL LOW (ref 40.0–52.0)
HEMOGLOBIN: 13.3 g/dL (ref 13.0–18.0)
MCH: 31.1 pg (ref 26.0–34.0)
MCHC: 34.9 g/dL (ref 32.0–36.0)
MCV: 89.2 fL (ref 80.0–100.0)
PLATELETS: 277 10*3/uL (ref 150–440)
RBC: 4.28 MIL/uL — ABNORMAL LOW (ref 4.40–5.90)
RDW: 13.3 % (ref 11.5–14.5)
WBC: 7.9 10*3/uL (ref 3.8–10.6)

## 2016-11-23 LAB — GLUCOSE, CAPILLARY
GLUCOSE-CAPILLARY: 157 mg/dL — AB (ref 65–99)
Glucose-Capillary: 108 mg/dL — ABNORMAL HIGH (ref 65–99)

## 2016-11-23 LAB — ACETAMINOPHEN LEVEL: Acetaminophen (Tylenol), Serum: <10 ug/mL — ABNORMAL LOW (ref 10–30)

## 2016-11-23 LAB — ETHANOL

## 2016-11-23 LAB — SALICYLATE LEVEL

## 2016-11-23 MED ORDER — INSULIN ASPART 100 UNIT/ML ~~LOC~~ SOLN
4.0000 [IU] | Freq: Once | SUBCUTANEOUS | Status: AC
  Administered 2016-11-23: 4 [IU] via SUBCUTANEOUS
  Filled 2016-11-23: qty 1

## 2016-11-23 MED ORDER — RISPERIDONE 0.5 MG PO TBDP: 2.0000 mg | ORAL_TABLET | Freq: Two times a day (BID) | ORAL | Status: DC

## 2016-11-23 MED ORDER — LORAZEPAM 0.5 MG PO TABS
0.5000 mg | ORAL_TABLET | Freq: Once | ORAL | Status: AC
  Administered 2016-11-23: 0.5 mg via ORAL
  Filled 2016-11-23: qty 1

## 2016-11-23 MED ORDER — RISPERIDONE 1 MG PO TABS
0.5000 mg | ORAL_TABLET | Freq: Once | ORAL | Status: AC
  Administered 2016-11-23: 0.5 mg via ORAL
  Filled 2016-11-23: qty 1

## 2016-11-23 MED ORDER — RISPERIDONE 0.5 MG PO TBDP
1.0000 mg | ORAL_TABLET | Freq: Every day | ORAL | Status: DC
  Administered 2016-11-23 – 2016-11-24 (×2): 1 mg via ORAL
  Filled 2016-11-23 (×2): qty 2

## 2016-11-23 MED ORDER — ACETAMINOPHEN 500 MG PO TABS
1000.0000 mg | ORAL_TABLET | ORAL | Status: AC
  Administered 2016-11-23: 1000 mg via ORAL
  Filled 2016-11-23: qty 2

## 2016-11-23 MED ORDER — RISPERIDONE 1 MG PO TABS
0.5000 mg | ORAL_TABLET | Freq: Every day | ORAL | Status: DC
  Administered 2016-11-24: 0.5 mg via ORAL
  Filled 2016-11-23: qty 1

## 2016-11-23 MED ORDER — LORAZEPAM 0.5 MG PO TABS
0.5000 mg | ORAL_TABLET | Freq: Three times a day (TID) | ORAL | Status: DC | PRN
  Administered 2016-11-23 – 2016-11-24 (×2): 0.5 mg via ORAL
  Filled 2016-11-23 (×2): qty 1

## 2016-11-23 MED ORDER — INSULIN GLARGINE 100 UNIT/ML ~~LOC~~ SOLN
24.0000 [IU] | Freq: Every day | SUBCUTANEOUS | Status: DC
  Administered 2016-11-23: 24 [IU] via SUBCUTANEOUS
  Filled 2016-11-23 (×2): qty 0.24

## 2016-11-23 NOTE — BH Assessment (Signed)
Assessment Note  Jesus Holland is an 83 y.o. male who presents the ER due to his Care Home having concerns about his increase aggression and agitation. He was seen in his PCP office today at [facility]. After he was seen there, he was brought to the ER for further evaluation and possible Geriatric Psych Hospitalization.  During the interview, the patient was calm and pleasant. He was orient to person but not time or place. Patient denies SI/HI and AV/H.  [facility] DSS recently became his Guardian. The assigned Social Worker Marylene Land Riley-[phone] or [phone]).   Diagnosis: Dementia, Alzheimer's, with behavior disturbance  Past Medical History:  Past Medical History:  Diagnosis Date  . Arthritis   . Diabetes mellitus without complication [facility])     Past Surgical History:  Procedure Laterality Date  . none      Family History:  Family History  Problem Relation Age of Onset  . Family history unknown: Yes    Social History:  reports that he has never smoked. He has never used smokeless tobacco. He reports that he does not drink alcohol or use drugs.  Additional Social History:  Alcohol / Drug Use Pain Medications: See PTA Prescriptions: See PTA Over the Counter: See PTA History of alcohol / drug use?: No history of alcohol / drug abuse Longest period of sobriety (when/how long): No past or current use Negative Consequences of Use:  (n/a) Withdrawal Symptoms:  (n/a)  CIWA: CIWA-Ar BP: 126/65 Pulse Rate: (!) 106 COWS:    Allergies:  Allergies  Allergen Reactions  . Aspirin Nausea And Vomiting  . Escitalopram Other (See Comments)  . Metformin Other (See Comments)  . Quinine Nausea And Vomiting    Home Medications:  (Not in a hospital admission)  OB/GYN Status:  No LMP for male patient.  General Assessment Data Assessment unable to be completed: Yes Location of Assessment: [facility] ED TTS Assessment: In system Is this a Tele or Face-to-Face  Assessment?: Face-to-Face Is this an Initial Assessment or a Re-assessment for this encounter?: Initial Assessment Marital status: Single Maiden name: n/a Is patient pregnant?: No Pregnancy Status: No Living Arrangements: Other (Comment) (Care Home) Can pt return to current living arrangement?: Yes Admission Status: Involuntary Is patient capable of signing voluntary admission?: No (Under IVC) Referral Source: Self/Family/Friend Insurance type: UHC  Medical Screening Exam [facility] Walk-in ONLY) Medical Exam completed: Yes  Crisis Care Plan Living Arrangements: Other (Comment) (Care Home) Legal Guardian: Other: [facility] DSS Guardian Marylene Land Riley-[phone])) Name of Psychiatrist: Reports of none Name of Therapist: Reports of none  Education Status Is patient currently in school?: No Current Grade: n/a Highest grade of school patient has completed: n/a Name of school: n/a Contact person: n/a  Risk to self with the past 6 months Suicidal Ideation: No Has patient been a risk to self within the past 6 months prior to admission? : No Suicidal Intent: No Has patient had any suicidal intent within the past 6 months prior to admission? : No Is patient at risk for suicide?: No Suicidal Plan?: No Has patient had any suicidal plan within the past 6 months prior to admission? : No Access to Means: No What has been your use of drugs/alcohol within the last 12 months?: Reports of none Previous Attempts/Gestures: No How many times?: 0 Other Self Harm Risks: Reports of none Triggers for Past Attempts: None known Intentional Self Injurious Behavior: None Family Suicide History: No Recent stressful life event(s): Other (Comment) (Reports of none) Persecutory voices/beliefs?:  No Depression: No Depression Symptoms:  (Reports of none) Substance abuse history and/or treatment for substance abuse?: No Suicide prevention information given to non-admitted patients: Not  applicable  Risk to Others within the past 6 months Homicidal Ideation: No Does patient have any lifetime risk of violence toward others beyond the six months prior to admission? : Yes (comment) Thoughts of Harm to Others: No Current Homicidal Intent: No Current Homicidal Plan: No Access to Homicidal Means: No Identified Victim: History of aggression towards Care Home Staff History of harm to others?: Yes Assessment of Violence: In distant past Violent Behavior Description: History of aggression towards Care Home Staff Does patient have access to weapons?: No Criminal Charges Pending?: No Does patient have a court date: No Is patient on probation?: No  Psychosis Hallucinations: None noted Delusions: None noted  Mental Status Report Appearance/Hygiene: Unremarkable, In scrubs Eye Contact: Fair Motor Activity: Unable to assess Speech: Word salad, Soft Level of Consciousness: Unable to assess Mood: Other (Comment) Affect: Unable to Assess Anxiety Level: None Thought Processes: Unable to Assess Judgement: Unable to Assess Orientation: Person Obsessive Compulsive Thoughts/Behaviors: Unable to Assess  Cognitive Functioning Concentration: Unable to Assess Memory: Unable to Assess IQ: Average Insight: Unable to Assess Impulse Control: Unable to Assess Appetite: Fair Weight Loss: 0 Weight Gain: 0 Sleep: Unable to Assess Total Hours of Sleep: 8 Vegetative Symptoms: Unable to Assess  ADLScreening [facility] Assessment Services) Patient's cognitive ability adequate to safely complete daily activities?: Yes Patient able to express need for assistance with ADLs?: Yes Independently performs ADLs?: Yes (appropriate for developmental age)  Prior Inpatient Therapy Prior Inpatient Therapy: No Prior Therapy Dates: Reports of none Prior Therapy Facilty/Provider(s): Reports of none Reason for Treatment: Reports of none  Prior Outpatient Therapy Prior Outpatient Therapy: No Prior  Therapy Dates: Reports of none Prior Therapy Facilty/Provider(s): Reports of none Reason for Treatment: Reports of none Does patient have an ACCT team?: No Does patient have Intensive In-House Services?  : No Does patient have [facility] services? : No Does patient have P4CC services?: No  ADL Screening (condition at time of admission) Patient's cognitive ability adequate to safely complete daily activities?: Yes Is the patient deaf or have difficulty hearing?: No Does the patient have difficulty seeing, even when wearing glasses/contacts?: No Does the patient have difficulty concentrating, remembering, or making decisions?: No Patient able to express need for assistance with ADLs?: Yes Does the patient have difficulty dressing or bathing?: No Independently performs ADLs?: Yes (appropriate for developmental age) Does the patient have difficulty walking or climbing stairs?: No Weakness of Legs: None Weakness of Arms/Hands: None  Home Assistive Devices/Equipment Home Assistive Devices/Equipment: None  Therapy Consults (therapy consults require a physician order) PT Evaluation Needed: No OT Evalulation Needed: No SLP Evaluation Needed: No Abuse/Neglect Assessment (Assessment to be complete while patient is alone) Physical Abuse: Denies Verbal Abuse: Denies Sexual Abuse: Denies Exploitation of patient/patient's resources: Denies Self-Neglect: Denies Values / Beliefs Cultural Requests During Hospitalization: None Spiritual Requests During Hospitalization: None Consults Spiritual Care Consult Needed: No Social Work Consult Needed: No Merchant navy officer (For Healthcare) Does Patient Have a Medical Advance Directive?: No Would patient like information on creating a medical advance directive?: No - Patient declined    Additional Information 1:1 In Past 12 Months?: No CIRT Risk: No Elopement Risk: No Does patient have medical clearance?: Yes  Child/Adolescent Assessment Running  Away Risk: Denies (Patient is an adult)  Disposition:  Disposition Initial Assessment Completed for this Encounter: Yes Disposition of  Patient: Other dispositions (ER MD Ordered Psych Consult )  On Site Evaluation by:   Reviewed with Physician:    Lilyan Gilford MS, LCAS, LPC, NCC, CCSI Therapeutic Triage Specialist 11/23/2016 5:48 PM

## 2016-11-23 NOTE — ED Notes (Signed)
After 5pm and on weekends call: [phone] for legal guardian

## 2016-11-23 NOTE — ED Notes (Signed)
Pt provided crackers by this tech

## 2016-11-23 NOTE — ED Notes (Signed)
Called [facility] for consult   [phone]

## 2016-11-23 NOTE — BH Assessment (Signed)
Referral information for Psychiatric Hospitalization faxed to;    [facility] [phone]),    [facility] [phone]),    [address] [phone]),    [facility] [phone])   [place] [phone] or [phone]),    [facility] [phone]),    [facility] [phone]).

## 2016-11-23 NOTE — ED Provider Notes (Signed)
[facility] Emergency Department Provider Note   ____________________________________________   First MD Initiated Contact with Patient 11/23/16 1512     (approximate)  I have reviewed the triage vital signs and the nursing notes.   HISTORY  Chief Complaint Aggressive Behavior    HPI Jesus Holland is a 83 y.o. male who himself reports going to the clinic today to be seen for prescription for back pain medicine with Dr. Judithann Sheen. He reports he suffered from chronic back pain for years, and needs pain medicine for this.  Dr. Judithann Sheen called, spoke with him and he reports the patient was sent for concerns that the patient needed elevated levels of geriatric psych care as he is in assisted living in [place] aggressive and acting violently towards other patients at the facility occasionally. He was sent here for psychiatric consultation.  patient denies any desire to harm himself or anyone else. Dr. Judithann Sheen however reports the patient has been violent at times very agitated and needs to be under involuntary commitment for safety of others at his assisted living   Past Medical History:  Diagnosis Date  . Arthritis   . Diabetes mellitus without complication [facility])     Patient Active Problem List   Diagnosis Date Noted  . Psychosis (HCC) 10/29/2016  . Hyperglycemia 10/23/2016  . Dementia, Alzheimer's, with behavior disturbance 10/23/2016    Past Surgical History:  Procedure Laterality Date  . none      Prior to Admission medications   Medication Sig Start Date End Date Taking? Authorizing Provider  amLODipine (NORVASC) 2.5 MG tablet Take 1 tablet (2.5 mg total) by mouth daily. 11/12/16  Yes Katha Hamming, MD  insulin glargine (LANTUS) 100 UNIT/ML injection Inject 0.3 mLs (30 Units total) into the skin daily. 11/16/16  Yes Katha Hamming, MD  LORazepam (ATIVAN) 0.5 MG tablet Take 1 tablet (0.5 mg total) by mouth every 8 (eight) hours as needed for  anxiety. 11/12/16  Yes Katha Hamming, MD  losartan (COZAAR) 100 MG tablet Take 1 tablet (100 mg total) by mouth daily. 11/13/16  Yes Katha Hamming, MD  risperiDONE (RISPERDAL M-TABS) 2 MG disintegrating tablet Take 1 tablet (2 mg total) by mouth 2 (two) times daily. 11/15/16  Yes Clapacs, Jackquline Denmark, MD  acetaminophen (TYLENOL) 325 MG tablet Take 2 tablets (650 mg total) by mouth every 6 (six) hours as needed. 11/17/16   Sharman Cheek, MD    Allergies Aspirin; Escitalopram; Metformin; and Quinine  Family History  Problem Relation Age of Onset  . Family history unknown: Yes    Social History Social History  Substance Use Topics  . Smoking status: Never Smoker  . Smokeless tobacco: Never Used  . Alcohol use No    Review of Systems Constitutional: No fever/chills Eyes: No visual changes. ENT: No sore throat. Cardiovascular: Denies chest pain. Respiratory: Denies shortness of breath. Gastrointestinal: No abdominal pain.  No nausea, no vomiting.  No diarrhea.  No constipation. Genitourinary: Negative for dysuria. Musculoskeletal: chronic back pain. No changes, denies any trouble with urination. No trouble walking. Skin: Negative for rash. Neurological: Negative for headaches, focal weakness or numbness.    ____________________________________________   PHYSICAL EXAM:  VITAL SIGNS: ED Triage Vitals  Enc Vitals Group     BP 11/23/16 1433 126/65     Pulse Rate 11/23/16 1433 (!) 106     Resp 11/23/16 1433 16     Temp 11/23/16 1433 98.5 F (36.9 C)     Temp Source 11/23/16 1433  Oral     SpO2 11/23/16 1433 98 %     Weight --      Height --      Head Circumference --      Peak Flow --      Pain Score 11/23/16 1427 10     Pain Loc --      Pain Edu? --      Excl. in GC? --     Constitutional: Alert and orientedto self and place but does report 2 years 1980. Well appearing and in no acute distress. Eyes: Conjunctivae are normal. Head: Atraumatic. Nose: No  congestion/rhinnorhea. Mouth/Throat: Mucous membranes are moist. Neck: No stridor.   Cardiovascular: Normal rate, regular rhythm. Grossly normal heart sounds.  Good peripheral circulation. Respiratory: Normal respiratory effort.  No retractions. Lungs CTAB. Gastrointestinal: Soft and nontender. No distention. Musculoskeletal: No lower extremity tenderness nor edema.lower back appears normal to exam. He is able to stand and walk without difficulty. Normal strength in lower extremities Neurologic:  Normal speech and language. No gross focal neurologic deficits are appreciated.  Skin:  Skin is warm, dry and intact. No rash noted. Psychiatric: Mood and affect are normal. Speech and behavior are normal.  ____________________________________________   LABS (all labs ordered are listed, but only abnormal results are displayed)  Labs Reviewed  COMPREHENSIVE METABOLIC PANEL - Abnormal; Notable for the following:       Result Value   Glucose, Bld 302 (*)    Calcium 8.7 (*)    ALT 14 (*)    All other components within normal limits  ACETAMINOPHEN LEVEL - Abnormal; Notable for the following:    Acetaminophen (Tylenol), Serum <10 (*)    All other components within normal limits  CBC - Abnormal; Notable for the following:    RBC 4.28 (*)    HCT 38.2 (*)    All other components within normal limits  GLUCOSE, CAPILLARY - Abnormal; Notable for the following:    Glucose-Capillary 157 (*)    All other components within normal limits  GLUCOSE, CAPILLARY - Abnormal; Notable for the following:    Glucose-Capillary 108 (*)    All other components within normal limits  ETHANOL  SALICYLATE LEVEL  URINE DRUG SCREEN, QUALITATIVE ([facility] ONLY)  CBG MONITORING, ED   ____________________________________________  EKG   ____________________________________________  RADIOLOGY   ____________________________________________   PROCEDURES  Procedure(s) performed: None  Procedures  Critical Care  performed: No  ____________________________________________   INITIAL IMPRESSION / ASSESSMENT AND PLAN / ED COURSE  Pertinent labs & imaging results that were available during my care of the patient were reviewed by me and considered in my medical decision making (see chart for details).  patient presents for request of psychiatric evaluation through the primary care physician Dr. Judithann Sheen who is recommended to be the patient should be placed on IVC for safety if persons as assisted living. The patient does have a long history of psychiatric disease, and his back pain complaints seems to be stable. He denies self-injurious behavior to me and I will place him under involuntary commitment and place TTS consult based on corroborating history given the primary care physician. He does not appear to be suffering from any acute medical condition that would contribute to this, and he is usually well documented have a history of psychosis.   ----------------------------------------- 12:37 AM on 11/24/2016 -----------------------------------------  Patient seen by psychiatry are recommending placement. TTS consult placed and bed search initiated.      ____________________________________________  FINAL CLINICAL IMPRESSION(S) / ED DIAGNOSES  Final diagnoses:  Dementia with aggressive behavior      NEW MEDICATIONS STARTED DURING THIS VISIT:  New Prescriptions   No medications on file     Note:  This document was prepared using Dragon voice recognition software and may include unintentional dictation errors.     Sharyn Creamer, MD 11/24/16 [phone]

## 2016-11-23 NOTE — ED Notes (Signed)
IVC 

## 2016-11-23 NOTE — ED Notes (Signed)
Pt given meal tray.

## 2016-11-23 NOTE — ED Notes (Signed)
This tech received report from ED Medic Mancel Bale), this tech will continue to perform q 15 min rounds on pt

## 2016-11-23 NOTE — ED Triage Notes (Signed)
Patient brought over from [facility] for aggressive behavior. Per RN from [facility] pt has recently been in 3 different facilities and has been abusive toward the staff at the facilities. Current facility send him to [facility] today for Geri-psych placement. Patient is calm and cooperative in triage.

## 2016-11-24 LAB — URINE DRUG SCREEN, QUALITATIVE (ARMC ONLY)
Amphetamines, Ur Screen: NOT DETECTED
BARBITURATES, UR SCREEN: NOT DETECTED
BENZODIAZEPINE, UR SCRN: NOT DETECTED
CANNABINOID 50 NG, UR ~~LOC~~: NOT DETECTED
Cocaine Metabolite,Ur ~~LOC~~: NOT DETECTED
MDMA (ECSTASY) UR SCREEN: NOT DETECTED
Methadone Scn, Ur: NOT DETECTED
Opiate, Ur Screen: NOT DETECTED
PHENCYCLIDINE (PCP) UR S: NOT DETECTED
TRICYCLIC, UR SCREEN: NOT DETECTED

## 2016-11-24 LAB — GLUCOSE, CAPILLARY
GLUCOSE-CAPILLARY: 44 mg/dL — AB (ref 65–99)
GLUCOSE-CAPILLARY: 200 mg/dL — AB (ref 65–99)
GLUCOSE-CAPILLARY: 182 mg/dL — AB (ref 65–99)
Glucose-Capillary: 116 mg/dL — ABNORMAL HIGH (ref 65–99)
Glucose-Capillary: 64 mg/dL — ABNORMAL LOW (ref 65–99)
Glucose-Capillary: 214 mg/dL — ABNORMAL HIGH (ref 65–99)

## 2016-11-24 MED ORDER — INSULIN GLARGINE 100 UNIT/ML ~~LOC~~ SOLN
12.0000 [IU] | Freq: Every day | SUBCUTANEOUS | Status: DC
  Administered 2016-11-25: 12 [IU] via SUBCUTANEOUS
  Filled 2016-11-24 (×3): qty 0.12

## 2016-11-24 MED ORDER — ACETAMINOPHEN 500 MG PO TABS
ORAL_TABLET | ORAL | Status: AC
  Filled 2016-11-24: qty 2

## 2016-11-24 MED ORDER — ACETAMINOPHEN 500 MG PO TABS
1000.0000 mg | ORAL_TABLET | Freq: Once | ORAL | Status: AC
  Administered 2016-11-24: 1000 mg via ORAL

## 2016-11-24 NOTE — ED Notes (Signed)
Patient observed lying in bed with eyes closed  Even, unlabored respirations observed   NAD pt appears to be sleeping  I will continue to monitor along with every 15 minute visual observations and ongoing security monitoring    

## 2016-11-24 NOTE — ED Notes (Signed)
ENVIRONMENTAL ASSESSMENT  Potentially harmful objects out of patient reach: Yes.  Personal belongings secured: Yes.  Patient dressed in hospital provided attire only: Yes.  Plastic bags out of patient reach: Yes.  Patient care equipment (cords, cables, call bells, lines, and drains) shortened, removed, or accounted for: Yes.  Equipment and supplies removed from bottom of stretcher: Yes.  Potentially toxic materials out of patient reach: Yes.  Sharps container removed or out of patient reach: Yes.  BEHAVIORAL HEALTH ROUNDING  Patient sleeping: No.  Patient alert and oriented: yes  Behavior appropriate: Yes. ; If no, describe:  Nutrition and fluids offered: Yes  Toileting and hygiene offered: Yes  Sitter present: not applicable, Q 15 min safety rounds and observation.  Law enforcement present: Yes ODS  ED BHU PLACEMENT JUSTIFICATION  Is the patient under IVC or is there intent for IVC: Yes.  Is the patient medically cleared: Yes.  Is there vacancy in the ED BHU: Yes.  Is the population mix appropriate for patient: No  Is the patient awaiting placement in inpatient or outpatient setting: Yes.  Has the patient had a psychiatric consult: Yes.  Survey of unit performed for contraband, proper placement and condition of furniture, tampering with fixtures in bathroom, shower, and each patient room: Yes. ; Findings: All clear  APPEARANCE/BEHAVIOR  calm, cooperative and adequate rapport can be established  NEURO ASSESSMENT  Orientation:  person  Hallucinations: No.None noted (Hallucinations)  Speech: Normal  Gait: unsteady RESPIRATORY ASSESSMENT  WNL  CARDIOVASCULAR ASSESSMENT  WNL  GASTROINTESTINAL ASSESSMENT  WNL  EXTREMITIES  WNL  PLAN OF CARE  Provide calm/safe environment. Vital signs assessed TID. ED BHU Assessment once each 12-hour shift. Collaborate with TTS daily or as condition indicates. Assure the ED provider has rounded once each shift. Provide and encourage hygiene.  Provide redirection as needed. Assess for escalating behavior; address immediately and inform ED provider.  Assess family dynamic and appropriateness for visitation as needed: Yes. ; If necessary, describe findings:  Educate the patient/family about BHU procedures/visitation: Yes. ; If necessary, describe findings: Pt is calm and cooperative at this time. Pt understanding and accepting of unit procedures/rules. Will continue to monitor with Q 15 min safety rounds and observation.

## 2016-11-24 NOTE — ED Provider Notes (Signed)
-----------------------------------------   6:28 AM on 11/24/2016 -----------------------------------------   Blood pressure (!) 148/75, pulse 83, temperature 98.8 F (37.1 C), temperature source Oral, resp. rate 18, SpO2 100 %.  The patient had no acute events since last update.  Sleeping at this time.  Disposition is pending Psychiatry/Behavioral Medicine team recommendations.     Irean Hong, MD 11/24/16 [phone]

## 2016-11-24 NOTE — ED Notes (Signed)
BEHAVIORAL HEALTH ROUNDING  Patient sleeping: No.  Patient alert and oriented: yes  Behavior appropriate: Yes. ; If no, describe:  Nutrition and fluids offered: Yes  Toileting and hygiene offered: Yes  Sitter present: not applicable, Q 15 min safety rounds and observation.  Law enforcement present: Yes ODS  

## 2016-11-24 NOTE — ED Notes (Signed)
BEHAVIORAL HEALTH ROUNDING Patient sleeping: No. Patient alert and oriented: yes Behavior appropriate: Yes.  ; If no, describe:  Nutrition and fluids offered: yes Toileting and hygiene offered: Yes  Sitter present: q15 minute observations and security  monitoring Law enforcement present: Yes  ODS  

## 2016-11-24 NOTE — ED Notes (Signed)
IVC 

## 2016-11-24 NOTE — ED Notes (Addendum)
Pt diaper changed by [place] [facility] NT, pt confused as to why he's "being locked up in here", pt refutes recent events and circumstance of IVC

## 2016-11-24 NOTE — ED Notes (Signed)
ED Is the patient under IVC or is there intent for IVC: Yes.   Is the patient medically cleared: Yes.   Is there vacancy in the ED BHU: Yes.   Is the population mix appropriate for patient:  geriatric   Is the patient awaiting placement in inpatient or outpatient setting:  Geriatric inpt placement Has the patient had a psychiatric consult: Yes.   Survey of unit performed for contraband, proper placement and condition of furniture, tampering with fixtures in bathroom, shower, and each patient room: Yes.  ; Findings:  APPEARANCE/BEHAVIOR Calm and cooperative NEURO ASSESSMENT Orientation: oriented x3  Denies pain Hallucinations: No.None noted (Hallucinations) Speech: Normal Gait: normal RESPIRATORY ASSESSMENT Even  Unlabored respirations  CARDIOVASCULAR ASSESSMENT Pulses equal   regular rate  Skin warm and dry   GASTROINTESTINAL ASSESSMENT no GI complaint EXTREMITIES Full ROM  PLAN OF CARE Provide calm/safe environment. Vital signs assessed twice daily. ED BHU Assessment once each 12-hour shift. Collaborate with TTS daily or as condition indicates. Assure the ED provider has rounded once each shift. Provide and encourage hygiene. Provide redirection as needed. Assess for escalating behavior; address immediately and inform ED provider.  Assess family dynamic and appropriateness for visitation as needed: Yes.  ; If necessary, describe findings:  Educate the patient/family about BHU procedures/visitation: Yes.  ; If necessary, describe findings:

## 2016-11-24 NOTE — ED Notes (Signed)

## 2016-11-24 NOTE — ED Notes (Signed)
Pharmacy called for missing dose of lantus. Will send.

## 2016-11-24 NOTE — BHH Counselor (Signed)
Patient has been accepted to [facility].  Patient assigned to [address]. Accepting physician is Dr. Wynona Dove.  Call report to [phone] ask to be connected to [address].  Representative was [employer].  ER Staff is aware of it [facility] & Delaney Meigs Patient's Nurse)     The patient can arrive anytime after 8AM.

## 2016-11-24 NOTE — ED Notes (Signed)
BEHAVIORAL HEALTH ROUNDING Patient sleeping: Yes.   Patient alert and oriented: eyes closed  Appears to be asleep Behavior appropriate: Yes.  ; If no, describe:  Nutrition and fluids offered: Yes  Toileting and hygiene offered: sleeping Sitter present: q 15 minute observations and security monitoring Law enforcement present: yes  ODS 

## 2016-11-24 NOTE — BHH Counselor (Signed)
Spoke with intake representative at [facility] and she reported the patient's referral is being reviewed.

## 2016-11-24 NOTE — ED Notes (Addendum)
Pt bedding and clothes changed, nourishment given,  pt confused as to why he's here, believes Dr Judithann Sheen sent pt to ED d/t back pain, unable to orient pt to hx of being aggressive with staff, pt believes he came to [place] clinic from [facility]

## 2016-11-25 ENCOUNTER — Emergency Department: Payer: Medicare Other

## 2016-11-25 LAB — GLUCOSE, CAPILLARY: Glucose-Capillary: 111 mg/dL — ABNORMAL HIGH (ref 65–99)

## 2016-11-25 NOTE — ED Notes (Signed)
Attempted to call report again.

## 2016-11-25 NOTE — ED Notes (Signed)
IVC, accepted to [facility] after 8am

## 2016-11-25 NOTE — ED Notes (Signed)
Pt observed lying in bed on his side with the TV playing    Pt visualized with NAD  No verbalized needs or concerns at this time  Continue to monitor

## 2016-11-25 NOTE — ED Provider Notes (Signed)
Patient had minor fall. He fell backwards from standing onto his bottom. No head injury. No neck pain. No back pain. Moving legs well. Complains of mild pain in his buttocks. Will image coccyx to rule out fx   Jene Every, MD 11/25/16 [phone]

## 2016-11-25 NOTE — ED Notes (Signed)
Pt moved from stretcher to hospital bed for comfort.

## 2016-11-25 NOTE — ED Notes (Signed)
I was walking down the hallway and observed him standing in the doorway - I spoke and he spoke  - I nodded at him and then entered another room to hang an IV med  I immediately heard my rover yell and I stepped back out to see this pt sitting on the floor - Fall was witnessed by Geologist, engineering ODS   He did not lose consciousness and he verbalized to me that he did not hit his head  "i only hit my butt"  Assistance provided back to his feet and he ambulated with minimal assistance back to bed   Blood noted to left ring finger - cleansed and band aid placed   EDP Kinner in to see him with order placed for xray sacrum/coccyx   Continue to monitor

## 2016-11-25 NOTE — ED Notes (Signed)
BEHAVIORAL HEALTH ROUNDING  Patient sleeping: No.  Patient alert and oriented: yes  Behavior appropriate: Yes. ; If no, describe:  Nutrition and fluids offered: Yes  Toileting and hygiene offered: Yes  Sitter present: not applicable, Q 15 min safety rounds and observation.  Law enforcement present: Yes ODS  

## 2016-11-25 NOTE — ED Notes (Signed)
Pt to xray

## 2016-11-25 NOTE — ED Provider Notes (Signed)
-----------------------------------------   7:31 AM on 11/25/2016 -----------------------------------------   Blood pressure (!) 155/83, pulse 94, temperature 98.7 F (37.1 C), temperature source Oral, resp. rate 17, SpO2 100 %.  The patient had no acute events since last update.  Calm and cooperative at this time.  Disposition is pending CSW team recommendations.     Irean Hong, MD 11/25/16 [phone]

## 2016-11-25 NOTE — ED Notes (Signed)
ED  Is the patient under IVC or is there intent for IVC: Yes.   Is the patient medically cleared: Yes.   Is there vacancy in the ED BHU: Yes.   Is the population mix appropriate for patient: Yes.   Is the patient awaiting placement in inpatient or outpatient setting: Yes.   Geriatric psych placement  Has the patient had a psychiatric consult: Yes.   Survey of unit performed for contraband, proper placement and condition of furniture, tampering with fixtures in bathroom, shower, and each patient room: Yes.  ; Findings:  APPEARANCE/BEHAVIOR Calm and cooperative NEURO ASSESSMENT Orientation: oriented x3  Denies pain Hallucinations: No.None noted (Hallucinations) Speech: Normal Gait: normal RESPIRATORY ASSESSMENT Even  Unlabored respirations  CARDIOVASCULAR ASSESSMENT Pulses equal   regular rate  Skin warm and dry   GASTROINTESTINAL ASSESSMENT no GI complaint EXTREMITIES Full ROM  PLAN OF CARE Provide calm/safe environment. Vital signs assessed twice daily. ED BHU Assessment once each 12-hour shift. Collaborate with TTS when available daily or as condition indicates. Assure the ED provider has rounded once each shift. Provide and encourage hygiene. Provide redirection as needed. Assess for escalating behavior; address immediately and inform ED provider.  Assess family dynamic and appropriateness for visitation as needed: Yes.  ; If necessary, describe findings:  Educate the patient/family about BHU procedures/visitation: Yes.  ; If necessary, describe findings:

## 2016-11-25 NOTE — ED Notes (Signed)
No am meds ordered at this time  - assessment completed  Plan of care discussed including his pending move to [facility]   Educated him about geriatric placement and the difference   He denies pain   Continue to monitor

## 2016-11-25 NOTE — ED Notes (Signed)
BEHAVIORAL HEALTH ROUNDING Patient sleeping: Yes.   Patient alert and oriented: not applicable SLEEPING Behavior appropriate: Yes.  ; If no, describe: SLEEPING Nutrition and fluids offered: No SLEEPING Toileting and hygiene offered: NoSLEEPING Sitter present: not applicable, Q 15 min safety rounds and observation. Law enforcement present: Yes ODS 

## 2016-11-25 NOTE — ED Notes (Signed)
He has returned from xray  - NAD assessed  He will transfer to [facility] when transportation arrives  Continue to monitor

## 2016-11-25 NOTE — ED Notes (Signed)

## 2016-11-25 NOTE — ED Notes (Signed)
Pharmacy called for lantus dose.

## 2016-11-25 NOTE — ED Notes (Addendum)
Pt escorted to transport vehicle with [employer] officer Mariah Milling

## 2016-11-25 NOTE — ED Notes (Addendum)
Attempted to call report

## 2016-11-26 NOTE — BH Assessment (Signed)
11/26/2016 @ 11:24-Writer received phone call from patient's guardian, [facility] Cleophas Dunker) asking where he was. Writer informed her, he was transferred to [facility] on yesterday (11/26/2016). Writer also provided her with the contact information for the facility.

## 2016-12-04 ENCOUNTER — Emergency Department
Admission: EM | Admit: 2016-12-04 | Discharge: 2016-12-04 | Disposition: A | Discharge status: Home / Self Care | Payer: Medicare Other | Attending: Emergency Medicine | Admitting: Emergency Medicine

## 2016-12-04 ENCOUNTER — Encounter: Payer: Self-pay | Admitting: Emergency Medicine

## 2016-12-04 ENCOUNTER — Emergency Department: Payer: Medicare Other

## 2016-12-04 DIAGNOSIS — F0281 Dementia in other diseases classified elsewhere with behavioral disturbance: Secondary | ICD-10-CM | POA: Insufficient documentation

## 2016-12-04 DIAGNOSIS — Z79899 Other long term (current) drug therapy: Secondary | ICD-10-CM | POA: Diagnosis not present

## 2016-12-04 DIAGNOSIS — E119 Type 2 diabetes mellitus without complications: Secondary | ICD-10-CM | POA: Insufficient documentation

## 2016-12-04 DIAGNOSIS — R4182 Altered mental status, unspecified: Secondary | ICD-10-CM | POA: Diagnosis present

## 2016-12-04 DIAGNOSIS — Z794 Long term (current) use of insulin: Secondary | ICD-10-CM | POA: Diagnosis not present

## 2016-12-04 DIAGNOSIS — G309 Alzheimer's disease, unspecified: Secondary | ICD-10-CM | POA: Insufficient documentation

## 2016-12-04 LAB — COMPREHENSIVE METABOLIC PANEL
ALK PHOS: 70 U/L (ref 38–126)
ALT: 16 U/L — AB (ref 17–63)
AST: 24 U/L (ref 15–41)
Albumin: 3.6 g/dL (ref 3.5–5.0)
Anion gap: 10 (ref 5–15)
BILIRUBIN TOTAL: 0.7 mg/dL (ref 0.3–1.2)
BUN: 24 mg/dL — AB (ref 6–20)
CALCIUM: 8.8 mg/dL — AB (ref 8.9–10.3)
CHLORIDE: 106 mmol/L (ref 101–111)
CO2: 20 mmol/L — ABNORMAL LOW (ref 22–32)
CREATININE: 1.16 mg/dL (ref 0.61–1.24)
GFR calc Af Amer: >60 mL/min (ref >60)
GFR, EST NON AFRICAN AMERICAN: 55 mL/min — AB (ref >60)
Glucose, Bld: 297 mg/dL — ABNORMAL HIGH (ref 65–99)
Potassium: 3.7 mmol/L (ref 3.5–5.1)
Sodium: 136 mmol/L (ref 135–145)
Total Protein: 6.5 g/dL (ref 6.5–8.1)

## 2016-12-04 LAB — URINALYSIS, ROUTINE W REFLEX MICROSCOPIC
Bacteria, UA: NONE SEEN
Bilirubin Urine: NEGATIVE
Glucose, UA: >=500 mg/dL — AB
Hgb urine dipstick: NEGATIVE
Ketones, ur: 5 mg/dL — AB
LEUKOCYTES UA: NEGATIVE
Nitrite: NEGATIVE
PH: 5 (ref 5.0–8.0)
Protein, ur: 30 mg/dL — AB
SPECIFIC GRAVITY, URINE: 1.016 (ref 1.005–1.030)
SQUAMOUS EPITHELIAL / LPF: NONE SEEN

## 2016-12-04 LAB — CBC
HCT: 38.3 % — ABNORMAL LOW (ref 40.0–52.0)
Hemoglobin: 12.9 g/dL — ABNORMAL LOW (ref 13.0–18.0)
MCH: 30.1 pg (ref 26.0–34.0)
MCHC: 33.7 g/dL (ref 32.0–36.0)
MCV: 89.3 fL (ref 80.0–100.0)
PLATELETS: 235 10*3/uL (ref 150–440)
RBC: 4.29 MIL/uL — ABNORMAL LOW (ref 4.40–5.90)
RDW: 13.8 % (ref 11.5–14.5)
WBC: 12.3 10*3/uL — AB (ref 3.8–10.6)

## 2016-12-04 NOTE — ED Notes (Signed)
Verbal consent from legal guardian over discharge and follow up information. Report given to Legrand ComoLinda, Rn and home place.

## 2016-12-04 NOTE — ED Notes (Signed)
Spoke with pt's legal guardian Cleophas Dunkerngela Riley at [phone] who wants pt to be transferred by ems to [facility] for discharge.
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
## 2016-12-04 NOTE — Discharge Instructions (Signed)
As we discussed, your workup today was reassuring.  Though we do not know exactly what is causing your symptoms, it appears that you have no emergent medical condition at this time and that you are safe to go home and follow up as recommended in this paperwork. ° °Please return immediately to the Emergency Department if you develop any new or worsening symptoms that concern you. ° °

## 2016-12-04 NOTE — ED Triage Notes (Signed)
Pt via ems from [facility]. EMS states that pt fell last night, but was able to help himself down to the floor, avoiding injury. They were told that pt had one episode of diarrhea this morning; and that he was "not himself" and was drooling. Pt denies all of these symptoms, and EMS states that when they arrived, pt was alert, oriented, and acting appropriately. Pt alert & oriented during triage. NAD Noted.

## 2016-12-04 NOTE — ED Notes (Signed)
[employer] is guardian; needs to be contacted at discharge  Cleophas Dunkerngela Riley cell [phone]

## 2016-12-14 ENCOUNTER — Emergency Department
Admission: EM | Admit: 2016-12-14 | Discharge: 2016-12-14 | Disposition: A | Discharge status: Home / Self Care | Payer: Medicare Other | Attending: Emergency Medicine | Admitting: Emergency Medicine

## 2016-12-14 ENCOUNTER — Encounter: Payer: Self-pay | Admitting: Emergency Medicine

## 2016-12-14 DIAGNOSIS — R739 Hyperglycemia, unspecified: Secondary | ICD-10-CM | POA: Diagnosis present

## 2016-12-14 DIAGNOSIS — Z79899 Other long term (current) drug therapy: Secondary | ICD-10-CM | POA: Diagnosis not present

## 2016-12-14 DIAGNOSIS — E119 Type 2 diabetes mellitus without complications: Secondary | ICD-10-CM | POA: Insufficient documentation

## 2016-12-14 DIAGNOSIS — G309 Alzheimer's disease, unspecified: Secondary | ICD-10-CM | POA: Diagnosis not present

## 2016-12-14 DIAGNOSIS — Z794 Long term (current) use of insulin: Secondary | ICD-10-CM | POA: Diagnosis not present

## 2016-12-14 DIAGNOSIS — F0281 Dementia in other diseases classified elsewhere with behavioral disturbance: Secondary | ICD-10-CM | POA: Diagnosis not present

## 2016-12-14 LAB — CBC
HCT: 38.7 % — ABNORMAL LOW (ref 40.0–52.0)
Hemoglobin: 13.5 g/dL (ref 13.0–18.0)
MCH: 30.9 pg (ref 26.0–34.0)
MCHC: 34.9 g/dL (ref 32.0–36.0)
MCV: 88.5 fL (ref 80.0–100.0)
PLATELETS: 265 10*3/uL (ref 150–440)
RBC: 4.37 MIL/uL — AB (ref 4.40–5.90)
RDW: 13.4 % (ref 11.5–14.5)
WBC: 6.6 10*3/uL (ref 3.8–10.6)

## 2016-12-14 LAB — BASIC METABOLIC PANEL
Anion gap: 6 (ref 5–15)
BUN: 17 mg/dL (ref 6–20)
CALCIUM: 8.8 mg/dL — AB (ref 8.9–10.3)
CHLORIDE: 106 mmol/L (ref 101–111)
CO2: 23 mmol/L (ref 22–32)
CREATININE: 0.92 mg/dL (ref 0.61–1.24)
GFR calc non Af Amer: >60 mL/min (ref >60)
Glucose, Bld: 323 mg/dL — ABNORMAL HIGH (ref 65–99)
Potassium: 3.8 mmol/L (ref 3.5–5.1)
SODIUM: 135 mmol/L (ref 135–145)

## 2016-12-14 LAB — GLUCOSE, CAPILLARY: Glucose-Capillary: 250 mg/dL — ABNORMAL HIGH (ref 65–99)

## 2016-12-14 MED ORDER — ACETAMINOPHEN 325 MG PO TABS
ORAL_TABLET | ORAL | Status: AC
  Administered 2016-12-14: 650 mg via ORAL
  Filled 2016-12-14: qty 2

## 2016-12-14 MED ORDER — ACETAMINOPHEN 325 MG PO TABS
650.0000 mg | ORAL_TABLET | Freq: Once | ORAL | Status: AC
  Administered 2016-12-14: 650 mg via ORAL

## 2016-12-14 MED ORDER — SODIUM CHLORIDE 0.9 % IV BOLUS (SEPSIS)
1000.0000 mL | Freq: Once | INTRAVENOUS | Status: AC
  Administered 2016-12-14: 1000 mL via INTRAVENOUS

## 2016-12-14 MED ORDER — INSULIN GLARGINE 100 UNIT/ML ~~LOC~~ SOLN
15.0000 [IU] | Freq: Once | SUBCUTANEOUS | Status: AC
  Administered 2016-12-14: 15 [IU] via SUBCUTANEOUS
  Filled 2016-12-14: qty 0.15

## 2016-12-14 NOTE — ED Triage Notes (Signed)
Pt brought in by [facility] from the [facility] [place], EMS reports that staff called them stating that patient was not acting like himself, however, staff was not able to provide EMS with what patient baseline is. Pt is A & O x 4. Denies any complaints at this time.

## 2016-12-14 NOTE — ED Notes (Signed)
Called and spoke with Crystal at the [facility] [address]. Informed her that pt was given 1 liter NS and lantus and that he is ready for d/c.   Per Crystal she will come pick patient up

## 2016-12-14 NOTE — ED Provider Notes (Signed)
[facility] Emergency Department Provider Note ____________________________________________   I have reviewed the triage vital signs and the triage nursing note.  HISTORY  Chief Complaint Hyperglycemia   Historian Patient seems to provide decent history, but has hx of dementia and behavioral disturbance and has DSS guardian History from EMS and [facility] staff.  HPI Jesus Holland is a 83 y.o. male with a history of diabetes, history of dementia and apparently history of psychosis with behavioral disturbance, has a DSS guardian, [place] the [place] with reported "not acting like himself.  "  Patient states that he is normally joking and happy because he was not doing that they sent him in.  Nurse care manager reports patient is new to them, and medications had not been sent over so he has missed 2 days of his Lantus.  However the medication should arrive today.  They felt like he was a little sleepy this morning so they wanted him to send in for evaluation because of not having of his Lantus and because they do not have an order for checking blood sugars.  Apparently they were unable to get in touch with either his new doctor his previous doctor in order to get an order for blood sugar checks.  Patient denies nausea, abdominal pain, chest pain, trouble breathing, fevers.   Past Medical History:  Diagnosis Date  . Arthritis   . Diabetes mellitus without complication [facility])     Patient Active Problem List   Diagnosis Date Noted  . Psychosis (HCC) 10/29/2016  . Hyperglycemia 10/23/2016  . Dementia, Alzheimer's, with behavior disturbance 10/23/2016    Past Surgical History:  Procedure Laterality Date  . none      Prior to Admission medications   Medication Sig Start Date End Date Taking? Authorizing Provider  acetaminophen (TYLENOL) 325 MG tablet Take 2 tablets (650 mg total) by mouth every 6 (six) hours as needed. 11/17/16  Yes Sharman Cheek, MD  amLODipine (NORVASC) 2.5 MG tablet Take 1 tablet (2.5 mg total) by mouth daily. 11/12/16  Yes Katha Hamming, MD  cyanocobalamin 1000 MCG tablet Take 1,000 mcg by mouth daily.   Yes [provider]  insulin glargine (LANTUS) 100 UNIT/ML injection Inject 0.3 mLs (30 Units total) into the skin daily. Patient taking differently: Inject 35 Units into the skin daily.  11/16/16  Yes Katha Hamming, MD  LORazepam (ATIVAN) 0.5 MG tablet Take 1 tablet (0.5 mg total) by mouth every 8 (eight) hours as needed for anxiety. Patient taking differently: Take 0.5 mg by mouth 2 (two) times daily.  11/12/16  Yes Katha Hamming, MD  losartan (COZAAR) 100 MG tablet Take 1 tablet (100 mg total) by mouth daily. 11/13/16  Yes Katha Hamming, MD  risperiDONE (RISPERDAL M-TABS) 2 MG disintegrating tablet Take 1 tablet (2 mg total) by mouth 2 (two) times daily. 11/15/16  Yes Clapacs, Jackquline Denmark, MD    Allergies  Allergen Reactions  . Aspirin Nausea And Vomiting  . Escitalopram Other (See Comments)  . Metformin Other (See Comments)  . Quinine Nausea And Vomiting    Family History  Problem Relation Age of Onset  . Family history unknown: Yes    Social History Social History  Substance Use Topics  . Smoking status: Never Smoker  . Smokeless tobacco: Never Used  . Alcohol use No    Review of Systems  Constitutional: Negative for fever. Eyes: Negative for visual changes. ENT: Negative for sore throat. Cardiovascular: Negative for  chest pain. Respiratory: Negative for shortness of breath. Gastrointestinal: Negative for abdominal pain, vomiting and diarrhea. Genitourinary: Negative for dysuria. Musculoskeletal: Negative for back pain. Skin: Negative for rash. Neurological: Negative for headache.  ____________________________________________   PHYSICAL EXAM:  VITAL SIGNS: ED Triage Vitals  Enc Vitals Group     BP 12/14/16 1524 (!) 156/83     Pulse Rate 12/14/16  1524 88     Resp 12/14/16 1524 13     Temp 12/14/16 1524 97.9 F (36.6 C)     Temp Source 12/14/16 1524 Oral     SpO2 12/14/16 1524 98 %     Weight 12/14/16 1526 172 lb (78 kg)     Height 12/14/16 1526 5\' 10"  (1.778 m)     Head Circumference --      Peak Flow --      Pain Score --      Pain Loc --      Pain Edu? --      Excl. in GC? --      Constitutional: Alert and oriented. Well appearing and in no distress. HEENT   Head: Normocephalic and atraumatic.      Eyes: Conjunctivae are normal. Pupils equal and round.       Ears:         Nose: No congestion/rhinnorhea.   Mouth/Throat: Mucous membranes are moist.   Neck: No stridor. Cardiovascular/Chest: Normal rate, regular rhythm.  No murmurs, rubs, or gallops. Respiratory: Normal respiratory effort without tachypnea nor retractions. Breath sounds are clear and equal bilaterally. No wheezes/rales/rhonchi. Gastrointestinal: Soft. No distention, no guarding, no rebound. Nontender.    Genitourinary/rectal:Deferred Musculoskeletal: Nontender with normal range of motion in all extremities. No joint effusions.  No lower extremity tenderness.  No edema. Neurologic:  Normal speech and language. No gross or focal neurologic deficits are appreciated. Skin:  Skin is warm, dry and intact. No rash noted. Psychiatric: Mood and affect are normal. Speech and behavior are normal. Patient exhibits appropriate insight and judgment.   ____________________________________________  LABS (pertinent positives/negatives) I, Governor Rooksebecca Ellis Mehaffey, MD the attending physician have reviewed the labs noted below.  Labs Reviewed  BASIC METABOLIC PANEL - Abnormal; Notable for the following:       Result Value   Glucose, Bld 323 (*)    Calcium 8.8 (*)    All other components within normal limits  CBC - Abnormal; Notable for the following:    RBC 4.37 (*)    HCT 38.7 (*)    All other components within normal limits     ____________________________________________    EKG I, Governor Rooksebecca Raeya Merritts, MD, the attending physician have personally viewed and interpreted all ECGs.  87 bpm normal sinus rhythm.  Left axis deviation.  Nonspecific ST and T wave ____________________________________________  RADIOLOGY All Xrays were viewed by me.  Imaging interpreted by Radiologist, and I, Governor Rooksebecca Lateia Fraser, MD the attending physician have reviewed the radiologist interpretation noted below.  None __________________________________________  PROCEDURES  Procedure(s) performed: None  Critical Care performed: None  ____________________________________________  No current facility-administered medications on file prior to encounter.    Current Outpatient Prescriptions on File Prior to Encounter  Medication Sig Dispense Refill  . acetaminophen (TYLENOL) 325 MG tablet Take 2 tablets (650 mg total) by mouth every 6 (six) hours as needed. 60 tablet 0  . amLODipine (NORVASC) 2.5 MG tablet Take 1 tablet (2.5 mg total) by mouth daily. 30 tablet 0  . insulin glargine (LANTUS) 100 UNIT/ML injection Inject 0.3 mLs (30  Units total) into the skin daily. (Patient taking differently: Inject 35 Units into the skin daily. ) 10 mL 11  . LORazepam (ATIVAN) 0.5 MG tablet Take 1 tablet (0.5 mg total) by mouth every 8 (eight) hours as needed for anxiety. (Patient taking differently: Take 0.5 mg by mouth 2 (two) times daily. ) 30 tablet 0  . losartan (COZAAR) 100 MG tablet Take 1 tablet (100 mg total) by mouth daily. 30 tablet 0  . risperiDONE (RISPERDAL M-TABS) 2 MG disintegrating tablet Take 1 tablet (2 mg total) by mouth 2 (two) times daily. 60 tablet 1    ____________________________________________  ED COURSE / ASSESSMENT AND PLAN  Pertinent labs & imaging results that were available during my care of the patient were reviewed by me and considered in my medical decision making (see chart for details).    Patient was sent here  because they did not have an order to check his blood sugar and they wanted to have his blood sugar checked since he had missed his Lantus.  Patient is asymptomatic.  Patient does have elevated blood sugar in the 300s, given a liter fluid.  He does have access to his medications, including Lantus which will arrive today told me by the care facility.  Because of elevated blood sugar, not being on medications for the last 2 days.  However he will apparently be able to receive it today.  Patient be discharged home.  He will have an order for blood sugar checks.  He got a half dose 15 units lantus this afternoon -- resume normal dosing for 8am lantus.   CONSULTATIONS:  None   Patient / Family / Caregiver informed of clinical course, medical decision-making process, and agree with plan.   I discussed return precautions, follow-up instructions, and discharge instructions with patient and/or family.  Discharge Instructions: You are evaluated to check your blood sugar and it was slightly elevated in the 300s and you are given IV fluids.  Please reinitiate medications as they arrived to the facility today.  Return to the emergency department immediately for any worsening condition including nausea, sweats, altered mental status, fevers, or any other symptoms concerning to you.  ___________________________________________   FINAL CLINICAL IMPRESSION(S) / ED DIAGNOSES   Final diagnoses:  Hyperglycemia              Note: This dictation was prepared with Dragon dictation. Any transcriptional errors that result from this process are unintentional    Governor Rooks, MD 12/14/16 1651

## 2016-12-14 NOTE — Discharge Instructions (Signed)
You are evaluated to check your blood sugar and it was slightly elevated in the 300s and you are given IV fluids.  Please reinitiate medications as they arrived to the facility today.  Return to the emergency department immediately for any worsening condition including nausea, sweats, altered mental status, fevers, or any other symptoms concerning to you.  He was given half dose (15units) lantus this evening.  Resume his regular lantus dose in the morning.

## 2016-12-26 ENCOUNTER — Other Ambulatory Visit: Payer: Self-pay

## 2016-12-26 ENCOUNTER — Emergency Department: Payer: Medicare Other

## 2016-12-26 ENCOUNTER — Emergency Department
Admission: EM | Admit: 2016-12-26 | Discharge: 2016-12-26 | Disposition: A | Discharge status: Home / Self Care | Payer: Medicare Other | Attending: Student in an Organized Health Care Education/Training Program | Admitting: Student in an Organized Health Care Education/Training Program

## 2016-12-26 DIAGNOSIS — Z79899 Other long term (current) drug therapy: Secondary | ICD-10-CM | POA: Insufficient documentation

## 2016-12-26 DIAGNOSIS — F0281 Dementia in other diseases classified elsewhere with behavioral disturbance: Secondary | ICD-10-CM | POA: Insufficient documentation

## 2016-12-26 DIAGNOSIS — Z794 Long term (current) use of insulin: Secondary | ICD-10-CM | POA: Diagnosis not present

## 2016-12-26 DIAGNOSIS — E119 Type 2 diabetes mellitus without complications: Secondary | ICD-10-CM | POA: Insufficient documentation

## 2016-12-26 DIAGNOSIS — G309 Alzheimer's disease, unspecified: Secondary | ICD-10-CM | POA: Insufficient documentation

## 2016-12-26 DIAGNOSIS — R079 Chest pain, unspecified: Secondary | ICD-10-CM

## 2016-12-26 LAB — CBC WITH DIFFERENTIAL/PLATELET
BASOS PCT: 1 %
Basophils Absolute: 0.1 10*3/uL (ref 0–0.1)
EOS ABS: 0.1 10*3/uL (ref 0–0.7)
Eosinophils Relative: 1 %
HEMATOCRIT: 38.9 % — AB (ref 40.0–52.0)
HEMOGLOBIN: 13 g/dL (ref 13.0–18.0)
LYMPHS ABS: 1.5 10*3/uL (ref 1.0–3.6)
Lymphocytes Relative: 17 %
MCH: 30.2 pg (ref 26.0–34.0)
MCHC: 33.5 g/dL (ref 32.0–36.0)
MCV: 90.2 fL (ref 80.0–100.0)
MONO ABS: 0.4 10*3/uL (ref 0.2–1.0)
MONOS PCT: 5 %
Neutro Abs: 6.6 10*3/uL — ABNORMAL HIGH (ref 1.4–6.5)
Neutrophils Relative %: 76 %
Platelets: 251 10*3/uL (ref 150–440)
RBC: 4.31 MIL/uL — ABNORMAL LOW (ref 4.40–5.90)
RDW: 13.8 % (ref 11.5–14.5)
WBC: 8.7 10*3/uL (ref 3.8–10.6)

## 2016-12-26 LAB — BASIC METABOLIC PANEL
Anion gap: 11 (ref 5–15)
BUN: 16 mg/dL (ref 6–20)
CALCIUM: 8.9 mg/dL (ref 8.9–10.3)
CHLORIDE: 106 mmol/L (ref 101–111)
CO2: 22 mmol/L (ref 22–32)
CREATININE: 0.88 mg/dL (ref 0.61–1.24)
GFR calc non Af Amer: >60 mL/min (ref >60)
Glucose, Bld: 216 mg/dL — ABNORMAL HIGH (ref 65–99)
Potassium: 3.4 mmol/L — ABNORMAL LOW (ref 3.5–5.1)
SODIUM: 139 mmol/L (ref 135–145)

## 2016-12-26 LAB — TROPONIN I: Troponin I: <0.03 ng/mL (ref <0.03)

## 2016-12-26 MED ORDER — SODIUM CHLORIDE 0.9 % IV BOLUS (SEPSIS)
500.0000 mL | Freq: Once | INTRAVENOUS | Status: AC
  Administered 2016-12-26: 500 mL via INTRAVENOUS

## 2016-12-26 NOTE — ED Notes (Signed)
Attempted to make contact with Cleophas DunkerAngela Riley with DSS, as she is patient's legal guardian.  Left HIPAA compliant messages on her work phone as well as the mobile number provided.

## 2016-12-26 NOTE — ED Triage Notes (Signed)
[facility] [place].  Pt c/o CP x today that is worse on palpation.  [facility] that gave them report states that the patient has "pain syndrome" that results in CP and that he does not like to stay there if his favorite caregiver is not on staff for that day.  Pt has hx of dementia, but is A&Ox4.  Pt states that he has felt weaker in the past week and does not feel like his legs are working right.  Pt is in NAD at this time.  EDP saw pt upon arrival and orders placed by EDP.  Spoke with Marylene LandAngela at the [facility], she states that pt has history of substance abuse per private hired Comptrollersitter and that pt started complaining of pain once the private sitter was getting ready to leave for the day.  Marylene Landngela states that pt has a hx of the same.

## 2016-12-26 NOTE — ED Provider Notes (Addendum)
[facility] Emergency Department Provider Note    First MD Initiated Contact with Patient 12/26/16 1422     (approximate)  I have reviewed the triage vital signs and the nursing notes.   HISTORY  Chief Complaint Chest Pain    HPI Jesus Holland is a 83 y.o. male with a history of dementia living at [place] presents with chief complaint of chest wall pain started earlier today.  According to EMS the patient started complaining of pain after his favorite caregiver left for the day.  Denies any chest pain at this time except for with palpation of his chest wall.  Denies any shortness of breath.  Denies any nausea or vomiting.  No recent fevers.  No cough.  No numbness or tingling.   Past Medical History:  Diagnosis Date  . Arthritis   . Diabetes mellitus without complication (HCC)    Family History  Family history unknown: Yes   Past Surgical History:  Procedure Laterality Date  . none     Patient Active Problem List   Diagnosis Date Noted  . Psychosis (HCC) 10/29/2016  . Hyperglycemia 10/23/2016  . Dementia, Alzheimer's, with behavior disturbance 10/23/2016      Prior to Admission medications   Medication Sig Start Date End Date Taking? Authorizing Provider  acetaminophen (TYLENOL) 325 MG tablet Take 2 tablets (650 mg total) by mouth every 6 (six) hours as needed. 11/17/16   Sharman Cheek, MD  amLODipine (NORVASC) 2.5 MG tablet Take 1 tablet (2.5 mg total) by mouth daily. 11/12/16   Katha Hamming, MD  cyanocobalamin 1000 MCG tablet Take 1,000 mcg by mouth daily.    [provider]  insulin glargine (LANTUS) 100 UNIT/ML injection Inject 0.3 mLs (30 Units total) into the skin daily. Patient taking differently: Inject 35 Units into the skin daily.  11/16/16   Katha Hamming, MD  LORazepam (ATIVAN) 0.5 MG tablet Take 1 tablet (0.5 mg total) by mouth every 8 (eight) hours as needed for anxiety. Patient taking differently:  Take 0.5 mg by mouth 2 (two) times daily.  11/12/16   Katha Hamming, MD  losartan (COZAAR) 100 MG tablet Take 1 tablet (100 mg total) by mouth daily. 11/13/16   Katha Hamming, MD  risperiDONE (RISPERDAL M-TABS) 2 MG disintegrating tablet Take 1 tablet (2 mg total) by mouth 2 (two) times daily. 11/15/16   Clapacs, Jackquline Denmark, MD    Allergies Aspirin; Escitalopram; Metformin; and Quinine    Social History Social History   Tobacco Use  . Smoking status: Never Smoker  . Smokeless tobacco: Never Used  Substance Use Topics  . Alcohol use: No  . Drug use: No    Review of Systems Patient denies headaches, rhinorrhea, blurry vision, numbness, shortness of breath, chest pain, edema, cough, abdominal pain, nausea, vomiting, diarrhea, dysuria, fevers, rashes or hallucinations unless otherwise stated above in HPI. ____________________________________________   PHYSICAL EXAM:  VITAL SIGNS: Vitals:   12/26/16 1445  BP: (!) 168/92  Pulse: 97  Resp: 16  Temp: 98.6 F (37 C)  SpO2: 100%    Constitutional: Alert and in no acute distress Eyes: Conjunctivae are normal.  Head: Atraumatic. Nose: No congestion/rhinnorhea. Mouth/Throat: Mucous membranes are moist.   Neck: No stridor. Painless ROM.  Cardiovascular: Normal rate, regular rhythm. Grossly normal heart sounds.  Good peripheral circulation.  ttp of anterior chest wall pain, no crepitus, contusion or ecchymosis Respiratory: Normal respiratory effort.  No retractions. Lungs CTAB. Gastrointestinal: Soft and nontender. No distention.  No abdominal bruits. No CVA tenderness. Genitourinary:  Musculoskeletal: No lower extremity tenderness nor edema.  No joint effusions. Neurologic:  Normal speech and language. No gross focal neurologic deficits are appreciated. No facial droop Skin:  Skin is warm, dry and intact. No rash noted. Psychiatric: Mood and affect are normal. Speech and behavior are  normal.  ____________________________________________   LABS (all labs ordered are listed, but only abnormal results are displayed)  Results for orders placed or performed during the hospital encounter of 12/26/16 (from the past 24 hour(s))  CBC with Differential/Platelet     Status: Abnormal   Collection Time: 12/26/16  2:36 PM  Result Value Ref Range   WBC 8.7 3.8 - 10.6 K/uL   RBC 4.31 (L) 4.40 - 5.90 MIL/uL   Hemoglobin 13.0 13.0 - 18.0 g/dL   HCT 16.138.9 (L) 09.640.0 - 04.552.0 %   MCV 90.2 80.0 - 100.0 fL   MCH 30.2 26.0 - 34.0 pg   MCHC 33.5 32.0 - 36.0 g/dL   RDW 40.913.8 81.111.5 - 91.414.5 %   Platelets 251 150 - 440 K/uL   Neutrophils Relative % 76 %   Neutro Abs 6.6 (H) 1.4 - 6.5 K/uL   Lymphocytes Relative 17 %   Lymphs Abs 1.5 1.0 - 3.6 K/uL   Monocytes Relative 5 %   Monocytes Absolute 0.4 0.2 - 1.0 K/uL   Eosinophils Relative 1 %   Eosinophils Absolute 0.1 0 - 0.7 K/uL   Basophils Relative 1 %   Basophils Absolute 0.1 0 - 0.1 K/uL  Basic metabolic panel     Status: Abnormal   Collection Time: 12/26/16  2:36 PM  Result Value Ref Range   Sodium 139 135 - 145 mmol/L   Potassium 3.4 (L) 3.5 - 5.1 mmol/L   Chloride 106 101 - 111 mmol/L   CO2 22 22 - 32 mmol/L   Glucose, Bld 216 (H) 65 - 99 mg/dL   BUN 16 6 - 20 mg/dL   Creatinine, Ser 7.820.88 0.61 - 1.24 mg/dL   Calcium 8.9 8.9 - 95.610.3 mg/dL   GFR calc non Af Amer >60 >60 mL/min   GFR calc Af Amer >60 >60 mL/min   Anion gap 11 5 - 15  Troponin I     Status: None   Collection Time: 12/26/16  2:36 PM  Result Value Ref Range   Troponin I <0.03 <0.03 ng/mL   ____________________________________________  EKG My review and personal interpretation at Time: 14:25   Indication: chest pain  Rate: 95  Rhythm: sinus Axis: left Other: no stemi, normal intervals, no depressions ____________________________________________  RADIOLOGY  I personally reviewed all radiographic images ordered to evaluate for the above acute complaints and  reviewed radiology reports and findings.  These findings were personally discussed with the patient.  Please see medical record for radiology report.  ____________________________________________   PROCEDURES  Procedure(s) performed:  Procedures    Critical Care performed: no ____________________________________________   INITIAL IMPRESSION / ASSESSMENT AND PLAN / ED COURSE  Pertinent labs & imaging results that were available during my care of the patient were reviewed by me and considered in my medical decision making (see chart for details).  DDX: ACS, pericarditis, esophagitis, boerhaaves, pe, dissection, pna, bronchitis, costochondritis   Jesus PagesVictor Burel is a 83 y.o. who presents to the ED with chest pain as described above.  Very atypical chest pain and seems very reproducible with palpation.  EKG shows no evidence of acute ischemia. And is unchanged from  previous.  Troponin is negative.  Blood work otherwise reassuring.  No evidence of dissection.  No discomfort on my exam he denies any shortness of breath.  No evidence of congestive heart failure pneumonia or pneumothorax.  Based on his risk profile will observe the patient in the ER further risk stratify for ACS with repeat troponin. ----------------------------------------- 3:54 PM on 12/26/2016 -----------------------------------------   OBSERVATION CARE: This patient is being placed under observation care for the following reasons: Chest pain with repeat testing to rule out ischemia      Clinical Course as of Dec 26 1749  Wed Dec 26, 2016  1726 Patient remains stable.  Requesting something to eat.  [PR]    Clinical Course User Index [PR] Willy Eddy, MD   ----------------------------------------- 5:52 PM on 12/26/2016 -----------------------------------------   END OF OBSERVATION STATUS: After an appropriate period of observation, this patient is being discharged due to the following reason(s):  Repeat troponin is negative.  Patient remains hemodynamically stable and in no acute distress.   ____________________________________________   FINAL CLINICAL IMPRESSION(S) / ED DIAGNOSES  Final diagnoses:  Chest pain, unspecified type      NEW MEDICATIONS STARTED DURING THIS VISIT:  This SmartLink is deprecated. Use AVSMEDLIST instead to display the medication list for a patient.   Note:  This document was prepared using Dragon voice recognition software and may include unintentional dictation errors.    Willy Eddy, MD 12/26/16 1546    Willy Eddy, MD 12/26/16 [phone]

## 2017-01-26 ENCOUNTER — Emergency Department
Admission: EM | Admit: 2017-01-26 | Discharge: 2017-01-26 | Disposition: A | Discharge status: Home / Self Care | Payer: Medicare Other | Attending: Emergency Medicine | Admitting: Emergency Medicine

## 2017-01-26 ENCOUNTER — Encounter: Payer: Self-pay | Admitting: Emergency Medicine

## 2017-01-26 DIAGNOSIS — Z794 Long term (current) use of insulin: Secondary | ICD-10-CM | POA: Insufficient documentation

## 2017-01-26 DIAGNOSIS — G309 Alzheimer's disease, unspecified: Secondary | ICD-10-CM | POA: Diagnosis not present

## 2017-01-26 DIAGNOSIS — Z79899 Other long term (current) drug therapy: Secondary | ICD-10-CM | POA: Insufficient documentation

## 2017-01-26 DIAGNOSIS — E11649 Type 2 diabetes mellitus with hypoglycemia without coma: Secondary | ICD-10-CM | POA: Insufficient documentation

## 2017-01-26 DIAGNOSIS — E162 Hypoglycemia, unspecified: Secondary | ICD-10-CM

## 2017-01-26 LAB — TROPONIN I

## 2017-01-26 LAB — CBC WITH DIFFERENTIAL/PLATELET
BASOS ABS: 0.1 10*3/uL (ref 0–0.1)
BASOS PCT: 2 %
EOS ABS: 0.1 10*3/uL (ref 0–0.7)
EOS PCT: 2 %
HCT: 35.2 % — ABNORMAL LOW (ref 40.0–52.0)
Hemoglobin: 12.3 g/dL — ABNORMAL LOW (ref 13.0–18.0)
Lymphocytes Relative: 14 %
Lymphs Abs: 1 10*3/uL (ref 1.0–3.6)
MCH: 31.2 pg (ref 26.0–34.0)
MCHC: 35 g/dL (ref 32.0–36.0)
MCV: 89.2 fL (ref 80.0–100.0)
MONO ABS: 0.3 10*3/uL (ref 0.2–1.0)
Monocytes Relative: 5 %
Neutro Abs: 5.2 10*3/uL (ref 1.4–6.5)
Neutrophils Relative %: 77 %
PLATELETS: 224 10*3/uL (ref 150–440)
RBC: 3.95 MIL/uL — ABNORMAL LOW (ref 4.40–5.90)
RDW: 13.9 % (ref 11.5–14.5)
WBC: 6.7 10*3/uL (ref 3.8–10.6)

## 2017-01-26 LAB — COMPREHENSIVE METABOLIC PANEL
ALT: 18 U/L (ref 17–63)
AST: 24 U/L (ref 15–41)
Albumin: 3.6 g/dL (ref 3.5–5.0)
Alkaline Phosphatase: 57 U/L (ref 38–126)
Anion gap: 9 (ref 5–15)
BILIRUBIN TOTAL: 0.5 mg/dL (ref 0.3–1.2)
BUN: 19 mg/dL (ref 6–20)
CALCIUM: 8.6 mg/dL — AB (ref 8.9–10.3)
CO2: 23 mmol/L (ref 22–32)
CREATININE: 0.9 mg/dL (ref 0.61–1.24)
Chloride: 109 mmol/L (ref 101–111)
GFR calc Af Amer: >60 mL/min (ref >60)
Glucose, Bld: 107 mg/dL — ABNORMAL HIGH (ref 65–99)
POTASSIUM: 3.1 mmol/L — AB (ref 3.5–5.1)
Sodium: 141 mmol/L (ref 135–145)
TOTAL PROTEIN: 5.7 g/dL — AB (ref 6.5–8.1)

## 2017-01-26 LAB — GLUCOSE, CAPILLARY
GLUCOSE-CAPILLARY: 145 mg/dL — AB (ref 65–99)
GLUCOSE-CAPILLARY: 154 mg/dL — AB (ref 65–99)
GLUCOSE-CAPILLARY: 116 mg/dL — AB (ref 65–99)
Glucose-Capillary: 106 mg/dL — ABNORMAL HIGH (ref 65–99)

## 2017-01-26 NOTE — ED Notes (Signed)
This RN assisted patient with meal. Patient ate 1/2 Malaw[place]urkey sandwich, 1 apple sauce, 1 container chocolate ice cream with MD approval.

## 2017-01-26 NOTE — ED Triage Notes (Signed)
[facility] for the last several weeks at nighttime, today he was found to be sluggish and initial glu @ 40, after facility gave orange juice, glu @ 45, EMS glu was 120.  Patient is AOx4 and acknowledges questions, our initial glu is 106.

## 2017-01-26 NOTE — ED Notes (Signed)
Reviewed discharge instructions, follow-up care with patient and caregiver. Patient and caregiver verbalized understanding of all information reviewed. Patient stable, with no distress noted at this time.

## 2017-01-26 NOTE — ED Notes (Signed)
Unable to sign for d/c due to confusion. [facility] per report. Transported back to facility by EMS in NAD, asleep.

## 2017-01-26 NOTE — ED Notes (Signed)
[facility]

## 2017-01-26 NOTE — ED Provider Notes (Signed)
[facility] Emergency Department Provider Note  ____________________________________________   First MD Initiated Contact with Patient 01/26/17 0250     (approximate)  I have reviewed the triage vital signs and the nursing notes.   HISTORY  Chief Complaint Hypoglycemia  Level 5 exemption history limited by the patient's the  HPI Jesus PagesVictor Scheidt is a 83 y.o. male is brought to the emergency department by EMS after reportedly being found with low blood sugar in his nursing home.  Apparently he has been having frequent episodes of hypoglycemia roughly around 2 AM recently and today when staff checked his blood sugar it was 40.  They gave him orange juice and it was subsequently 45.  When EMS arrived his sugar was 120.  The patient is unable to provide any further history.  He does report a family history of diabetes mellitus.  Past Medical History:  Diagnosis Date  . Arthritis   . Diabetes mellitus without complication [facility](HCC)     Patient Active Problem List   Diagnosis Date Noted  . Psychosis (HCC) 10/29/2016  . Hyperglycemia 10/23/2016  . Dementia, Alzheimer's, with behavior disturbance 10/23/2016    Past Surgical History:  Procedure Laterality Date  . none      Prior to Admission medications   Medication Sig Start Date End Date Taking? Authorizing Provider  acetaminophen (TYLENOL) 325 MG tablet Take 2 tablets (650 mg total) by mouth every 6 (six) hours as needed. 11/17/16  Yes Sharman CheekStafford, Phillip, MD  cyanocobalamin 1000 MCG tablet Take 1,000 mcg by mouth daily.   Yes [provider]  docusate sodium (COLACE) 100 MG capsule Take 100 mg by mouth daily as needed for mild constipation.   Yes [provider]  insulin glargine (LANTUS) 100 UNIT/ML injection Inject 0.3 mLs (30 Units total) into the skin daily. Patient taking differently: Inject 30 Units into the skin at bedtime.  11/16/16  Yes Katha HammingKonidena, Snehalatha, MD  LORazepam (ATIVAN) 0.5  MG tablet Take 1 tablet (0.5 mg total) by mouth every 8 (eight) hours as needed for anxiety. Patient taking differently: Take 0.5 mg by mouth 2 (two) times daily.  11/12/16  Yes Katha HammingKonidena, Snehalatha, MD  losartan (COZAAR) 100 MG tablet Take 1 tablet (100 mg total) by mouth daily. 11/13/16  Yes Katha HammingKonidena, Snehalatha, MD  risperiDONE (RISPERDAL) 2 MG tablet Take 2 mg by mouth 2 (two) times daily.   Yes [provider]  amLODipine (NORVASC) 2.5 MG tablet Take 1 tablet (2.5 mg total) by mouth daily. Patient not taking: Reported on 01/26/2017 11/12/16   Katha HammingKonidena, Snehalatha, MD  risperiDONE (RISPERDAL M-TABS) 2 MG disintegrating tablet Take 1 tablet (2 mg total) by mouth 2 (two) times daily. Patient not taking: Reported on 01/26/2017 11/15/16   Clapacs, Jackquline DenmarkJohn T, MD    Allergies Aspirin; Escitalopram; Metformin; and Quinine  Family History  Family history unknown: Yes    Social History Social History   Tobacco Use  . Smoking status: Never Smoker  . Smokeless tobacco: Never Used  Substance Use Topics  . Alcohol use: No  . Drug use: No    Review of Systems Level 5 exemption history limited by the patient's dementia  ____________________________________________   PHYSICAL EXAM:  VITAL SIGNS: ED Triage Vitals  Enc Vitals Group     BP      Pulse      Resp      Temp      Temp src      SpO2  Weight      Height      Head Circumference      Peak Flow      Pain Score      Pain Loc      Pain Edu?      Excl. in GC?     Constitutional: Pleasant cooperative clearly demented Eyes: PERRL EOMI. Head: Atraumatic. Nose: No congestion/rhinnorhea. Mouth/Throat: No trismus Neck: No stridor.   Cardiovascular: Normal rate, regular rhythm. Grossly normal heart sounds.  Good peripheral circulation. Respiratory: Normal respiratory effort.  No retractions. Lungs CTAB and moving good air Gastrointestinal: Soft nontender Musculoskeletal: No lower extremity edema   Neurologic. No  gross focal neurologic deficits are appreciated. Skin:  Skin is warm, dry and intact. No rash noted.     ____________________________________________   DIFFERENTIAL includes but not limited to  Renal failure, urinary tract infection, medication overdose, metabolic derangement ____________________________________________   LABS (all labs ordered are listed, but only abnormal results are displayed)  Labs Reviewed  COMPREHENSIVE METABOLIC PANEL - Abnormal; Notable for the following components:      Result Value   Potassium 3.1 (*)    Glucose, Bld 107 (*)    Calcium 8.6 (*)    Total Protein 5.7 (*)    All other components within normal limits  CBC WITH DIFFERENTIAL/PLATELET - Abnormal; Notable for the following components:   RBC 3.95 (*)    Hemoglobin 12.3 (*)    HCT 35.2 (*)    All other components within normal limits  GLUCOSE, CAPILLARY - Abnormal; Notable for the following components:   Glucose-Capillary 106 (*)    All other components within normal limits  GLUCOSE, CAPILLARY - Abnormal; Notable for the following components:   Glucose-Capillary 145 (*)    All other components within normal limits  GLUCOSE, CAPILLARY - Abnormal; Notable for the following components:   Glucose-Capillary 154 (*)    All other components within normal limits  GLUCOSE, CAPILLARY - Abnormal; Notable for the following components:   Glucose-Capillary 116 (*)    All other components within normal limits  TROPONIN I    Blood work reviewed by me with no acute disease __________________________________________  EKG  ED ECG REPORT I, Merrily BrittleNeil Ellison Rieth, the attending physician, personally viewed and interpreted this ECG.  Date: 01/26/2017 EKG Time:  Rate: 88 Rhythm: normal sinus rhythm QRS Axis: normal Intervals: normal ST/T Wave abnormalities: normal Narrative Interpretation: EKG has an extremely wavy baseline but does appear to be normal sinus rhythm with no acute  ischemia  ____________________________________________  RADIOLOGY   ____________________________________________   PROCEDURES  Procedure(s) performed: no  Procedures  Critical Care performed: no  Observation: yes  ----------------------------------------- 4:02 AM on 01/26/2017 -----------------------------------------   OBSERVATION CARE: This patient is being placed under observation care for the following reasons: The patient had a transient episode of hypoglycemia he requires 3 hours of observation to evaluate if his sugar rebounds normally or drops down again     ____________________________________________   INITIAL IMPRESSION / ASSESSMENT AND PLAN / ED COURSE  Pertinent labs & imaging results that were available during my care of the patient were reviewed by me and considered in my medical decision making (see chart for details).  On arrival the patient has clear dementia and is overall well-appearing.  His blood glucose is normal.  Differential includes too much insulin (he is taking 35 units of Lantus a day) as well as new renal failure.  Labs are pending.  He is able to eat  complex carbs now.     ----------------------------------------- 4:02 AM on 01/26/2017 -----------------------------------------  Patient remains stable at this time.  Repeat glucose every hour for the next 2 hours pending. ____________________________________________  ----------------------------------------- 6:16 AM on 01/26/2017 -----------------------------------------   END OF OBSERVATION STATUS: After an appropriate period of observation, this patient is being discharged due to the following reason(s): After 3 hours of observation the patient is eating well with no abnormalities in his labs and his blood sugar I have recommended a decrease in his Lantus from 35 units to 20 and follow-up with primary care this coming Monday.  He is discharged home in improved condition  verbalizes agreement with the plan.   FINAL CLINICAL IMPRESSION(S) / ED DIAGNOSES  Final diagnoses:  Hypoglycemia      NEW MEDICATIONS STARTED DURING THIS VISIT:  This SmartLink is deprecated. Use AVSMEDLIST instead to display the medication list for a patient.   Note:  This document was prepared using Dragon voice recognition software and may include unintentional dictation errors.     Merrily Brittle, MD 01/26/17 [phone]

## 2017-01-26 NOTE — ED Notes (Signed)
POCT CBP 116. MD informed

## 2017-01-26 NOTE — Discharge Instructions (Addendum)
Please decrease your lantus from 35 units daily to 25 units daily and follow up with your PMD Monday for a recheck.  Return to the ED sooner for any concerns.  It was a pleasure to take care of you today, and thank you for coming to our emergency department.  If you have any questions or concerns before leaving please ask the nurse to grab me and I'm more than happy to go through your aftercare instructions again.  If you were prescribed any opioid pain medication today such as Norco, Vicodin, Percocet, morphine, hydrocodone, or oxycodone please make sure you do not drive when you are taking this medication as it can alter your ability to drive safely.  If you have any concerns once you are home that you are not improving or are in fact getting worse before you can make it to your follow-up appointment, please do not hesitate to call [phone] and come back for further evaluation.  Merrily BrittleNeil Karynn Deblasi, MD  Results for orders placed or performed during the hospital encounter of 01/26/17  Comprehensive metabolic panel  Result Value Ref Range   Sodium 141 135 - 145 mmol/L   Potassium 3.1 (L) 3.5 - 5.1 mmol/L   Chloride 109 101 - 111 mmol/L   CO2 23 22 - 32 mmol/L   Glucose, Bld 107 (H) 65 - 99 mg/dL   BUN 19 6 - 20 mg/dL   Creatinine, Ser 1.610.90 0.61 - 1.24 mg/dL   Calcium 8.6 (L) 8.9 - 10.3 mg/dL   Total Protein 5.7 (L) 6.5 - 8.1 g/dL   Albumin 3.6 3.5 - 5.0 g/dL   AST 24 15 - 41 U/L   ALT 18 17 - 63 U/L   Alkaline Phosphatase 57 38 - 126 U/L   Total Bilirubin 0.5 0.3 - 1.2 mg/dL   GFR calc non Af Amer >60 >60 mL/min   GFR calc Af Amer >60 >60 mL/min   Anion gap 9 5 - 15  Troponin I  Result Value Ref Range   Troponin I <0.03 <0.03 ng/mL  CBC with Differential  Result Value Ref Range   WBC 6.7 3.8 - 10.6 K/uL   RBC 3.95 (L) 4.40 - 5.90 MIL/uL   Hemoglobin 12.3 (L) 13.0 - 18.0 g/dL   HCT 09.635.2 (L) 04.540.0 - 40.952.0 %   MCV 89.2 80.0 - 100.0 fL   MCH 31.2 26.0 - 34.0 pg   MCHC 35.0 32.0 - 36.0 g/dL     RDW 81.113.9 91.411.5 - 78.214.5 %   Platelets 224 150 - 440 K/uL   Neutrophils Relative % 77 %   Neutro Abs 5.2 1.4 - 6.5 K/uL   Lymphocytes Relative 14 %   Lymphs Abs 1.0 1.0 - 3.6 K/uL   Monocytes Relative 5 %   Monocytes Absolute 0.3 0.2 - 1.0 K/uL   Eosinophils Relative 2 %   Eosinophils Absolute 0.1 0 - 0.7 K/uL   Basophils Relative 2 %   Basophils Absolute 0.1 0 - 0.1 K/uL  Glucose, capillary  Result Value Ref Range   Glucose-Capillary 106 (H) 65 - 99 mg/dL  Glucose, capillary  Result Value Ref Range   Glucose-Capillary 145 (H) 65 - 99 mg/dL

## 2017-02-20 ENCOUNTER — Other Ambulatory Visit: Payer: Self-pay

## 2017-02-20 ENCOUNTER — Emergency Department: Payer: Medicare Other

## 2017-02-20 ENCOUNTER — Emergency Department
Admission: EM | Admit: 2017-02-20 | Discharge: 2017-02-20 | Disposition: A | Discharge status: Home / Self Care | Payer: Medicare Other | Attending: Emergency Medicine | Admitting: Emergency Medicine

## 2017-02-20 ENCOUNTER — Encounter: Payer: Self-pay | Admitting: Emergency Medicine

## 2017-02-20 DIAGNOSIS — G309 Alzheimer's disease, unspecified: Secondary | ICD-10-CM | POA: Insufficient documentation

## 2017-02-20 DIAGNOSIS — R4182 Altered mental status, unspecified: Secondary | ICD-10-CM | POA: Insufficient documentation

## 2017-02-20 DIAGNOSIS — E119 Type 2 diabetes mellitus without complications: Secondary | ICD-10-CM | POA: Insufficient documentation

## 2017-02-20 DIAGNOSIS — F0280 Dementia in other diseases classified elsewhere without behavioral disturbance: Secondary | ICD-10-CM | POA: Insufficient documentation

## 2017-02-20 DIAGNOSIS — Z139 Encounter for screening, unspecified: Secondary | ICD-10-CM | POA: Insufficient documentation

## 2017-02-20 DIAGNOSIS — W19XXXA Unspecified fall, initial encounter: Secondary | ICD-10-CM

## 2017-02-20 LAB — COMPREHENSIVE METABOLIC PANEL
ALT: 11 U/L — ABNORMAL LOW (ref 17–63)
ANION GAP: 10 (ref 5–15)
AST: 16 U/L (ref 15–41)
Albumin: 3.5 g/dL (ref 3.5–5.0)
Alkaline Phosphatase: 66 U/L (ref 38–126)
BUN: 26 mg/dL — ABNORMAL HIGH (ref 6–20)
CHLORIDE: 107 mmol/L (ref 101–111)
CO2: 23 mmol/L (ref 22–32)
Calcium: 8.8 mg/dL — ABNORMAL LOW (ref 8.9–10.3)
Creatinine, Ser: 1.15 mg/dL (ref 0.61–1.24)
GFR, EST NON AFRICAN AMERICAN: 56 mL/min — AB (ref >60)
Glucose, Bld: 118 mg/dL — ABNORMAL HIGH (ref 65–99)
POTASSIUM: 3.3 mmol/L — AB (ref 3.5–5.1)
Sodium: 140 mmol/L (ref 135–145)
Total Bilirubin: 1 mg/dL (ref 0.3–1.2)
Total Protein: 6.2 g/dL — ABNORMAL LOW (ref 6.5–8.1)

## 2017-02-20 LAB — URINALYSIS, COMPLETE (UACMP) WITH MICROSCOPIC
BILIRUBIN URINE: NEGATIVE
Bacteria, UA: NONE SEEN
Glucose, UA: 50 mg/dL — AB
Hgb urine dipstick: NEGATIVE
KETONES UR: 5 mg/dL — AB
LEUKOCYTES UA: NEGATIVE
Nitrite: NEGATIVE
PH: 5 (ref 5.0–8.0)
Protein, ur: 30 mg/dL — AB
Specific Gravity, Urine: 1.035 — ABNORMAL HIGH (ref 1.005–1.030)

## 2017-02-20 LAB — CBC
HEMATOCRIT: 37.7 % — AB (ref 40.0–52.0)
HEMOGLOBIN: 13 g/dL (ref 13.0–18.0)
MCH: 31.2 pg (ref 26.0–34.0)
MCHC: 34.5 g/dL (ref 32.0–36.0)
MCV: 90.4 fL (ref 80.0–100.0)
PLATELETS: 240 10*3/uL (ref 150–440)
RBC: 4.17 MIL/uL — AB (ref 4.40–5.90)
RDW: 14.4 % (ref 11.5–14.5)
WBC: 8 10*3/uL (ref 3.8–10.6)

## 2017-02-20 MED ORDER — ACETAMINOPHEN 500 MG PO TABS
1000.0000 mg | ORAL_TABLET | Freq: Once | ORAL | Status: AC
  Administered 2017-02-20: 1000 mg via ORAL
  Filled 2017-02-20: qty 2

## 2017-02-20 NOTE — ED Notes (Signed)
Attempted to call patient's legal guardian, Cleophas Dunkerngela Riley with no answer. Will try again.

## 2017-02-20 NOTE — ED Provider Notes (Signed)
[facility] Emergency Department Provider Note       Time seen: ----------------------------------------- 7:15 AM on 02/20/2017 -----------------------------------------  I have reviewed the triage vital signs and the nursing notes.  HISTORY   Chief Complaint Altered Mental Status    HPI Rochele PagesVictor Scroggs is a 84 y.o. male with a history of arthritis, dementia and diabetes who presents to the ED for possible altered mental status.  Patient was reportedly not acting right.  Patient states to me that he actually fell and landed on his buttocks.  Otherwise he denies any complaints.  He does have a history of dementia but seems to be alert and oriented.  Past Medical History:  Diagnosis Date  . Arthritis   . Diabetes mellitus without complication [facility](HCC)     Patient Active Problem List   Diagnosis Date Noted  . Psychosis (HCC) 10/29/2016  . Hyperglycemia 10/23/2016  . Dementia, Alzheimer's, with behavior disturbance 10/23/2016    Past Surgical History:  Procedure Laterality Date  . none      Allergies Aspirin; Escitalopram; Metformin; and Quinine  Social History Social History   Tobacco Use  . Smoking status: Never Smoker  . Smokeless tobacco: Never Used  Substance Use Topics  . Alcohol use: No  . Drug use: No   Review of Systems Constitutional: Negative for fever. Cardiovascular: Negative for chest pain. Respiratory: Negative for shortness of breath. Gastrointestinal: Negative for abdominal pain, vomiting and diarrhea. Genitourinary: Negative for dysuria. Musculoskeletal: Positive for sacral pain Skin: Negative for rash. Neurological: Negative for headaches, focal weakness or numbness.  All systems negative/normal/unremarkable except as stated in the HPI  ____________________________________________   PHYSICAL EXAM:  VITAL SIGNS: ED Triage Vitals  Enc Vitals Group     BP 02/20/17 0649 115/69     Pulse Rate 02/20/17 0649 83   Resp 02/20/17 0649 13     Temp 02/20/17 0649 98.1 F (36.7 C)     Temp Source 02/20/17 0649 Oral     SpO2 02/20/17 0649 95 %     Weight 02/20/17 0651 177 lb (80.3 kg)     Height 02/20/17 0651 5\' 10"  (1.778 m)     Head Circumference --      Peak Flow --      Pain Score --      Pain Loc --      Pain Edu? --      Excl. in GC? --     Constitutional: Alert and oriented.  Well appearing and in no distress. Eyes: Conjunctivae are normal. Normal extraocular movements. ENT   Head: Normocephalic and atraumatic.   Nose: No congestion/rhinnorhea.   Mouth/Throat: Mucous membranes are moist.   Neck: No stridor. Cardiovascular: Normal rate, regular rhythm. No murmurs, rubs, or gallops. Respiratory: Normal respiratory effort without tachypnea nor retractions. Breath sounds are clear and equal bilaterally. No wheezes/rales/rhonchi. Gastrointestinal: Soft and nontender. Normal bowel sounds Musculoskeletal: Nontender with normal range of motion in extremities. No lower extremity tenderness nor edema. Neurologic:  Normal speech and language. No gross focal neurologic deficits are appreciated.  Skin:  Skin is warm, dry and intact. No rash noted. Psychiatric: Mood and affect are normal. Speech and behavior are normal.  ____________________________________________  EKG: Interpreted by me.  Sinus rhythm with a rate of 81 bpm, normal PR interval, normal QRS size, normal QT, possible inferior infarct old  ____________________________________________  ED COURSE:  As part of my medical decision making, I reviewed the following data within the electronic MEDICAL RECORD NUMBER  History obtained from family if available, nursing notes, old chart and ekg, as well as notes from prior ED visits. Patient presented for altered mental status and possibly fall, we will assess with labs and imaging as indicated at this time.   Procedures ____________________________________________   LABS (pertinent  positives/negatives)  Labs Reviewed  COMPREHENSIVE METABOLIC PANEL - Abnormal; Notable for the following components:      Result Value   Potassium 3.3 (*)    Glucose, Bld 118 (*)    BUN 26 (*)    Calcium 8.8 (*)    Total Protein 6.2 (*)    ALT 11 (*)    GFR calc non Af Amer 56 (*)    All other components within normal limits  CBC - Abnormal; Notable for the following components:   RBC 4.17 (*)    HCT 37.7 (*)    All other components within normal limits  URINALYSIS, COMPLETE (UACMP) WITH MICROSCOPIC  CBG MONITORING, ED    RADIOLOGY Images were viewed by me  CT head, chest x-ray, pelvis x-ray IMPRESSION: No appreciable change from prior study. No acute fracture or dislocation month Struble. Symmetric narrowing both hip joints, fairly mild.  IMPRESSION: Atrophy and small vessel disease. No acute intracranial findings. Stable appearance from priors. ____________________________________________  DIFFERENTIAL DIAGNOSIS   Fall, contusion, fracture, subdural, occult infection, dehydration, electrolyte abnormality  FINAL ASSESSMENT AND PLAN  Medical screening exam, fall   Plan: Patient had presented for altered mental status and possibly a fall. Patient's labs are reassuring. Patient's imaging are also reassuring.  He has been alert and oriented for me here.  He is stable to return to his living facility.   Emily Filbert, MD   Note: This note was generated in part or whole with voice recognition software. Voice recognition is usually quite accurate but there are transcription errors that can and very often do occur. I apologize for any typographical errors that were not detected and corrected.     Emily Filbert, MD 02/20/17 1135

## 2017-02-20 NOTE — ED Notes (Signed)
Pt out of room for imaging 

## 2017-02-20 NOTE — ED Notes (Signed)
Report called to the [facility] of [address]. Waiting on EMS for transportation back.

## 2017-02-20 NOTE — ED Notes (Addendum)
This RN called legal guardian, Cleophas Dunkerngela Riley, on file and received verbal consent to treat patient.

## 2017-02-20 NOTE — ED Notes (Signed)
Pt attempted to use urinal. Pt refused to be in and out cathed if necessary. Pt has urinal and instructed to call when sample obtained.

## 2017-02-20 NOTE — ED Notes (Signed)
Attempted to call patient's legal guardian Cleophas Dunkerngela Riley concerning patient's discharge and left message at both numbers available.

## 2017-02-20 NOTE — ED Triage Notes (Addendum)
Pt bib [facility] from [facility] d/t call out for "patient not acting right". Pt is A&Ox4, in NAD, Neuro WNL. VSS in route, CBG 141. Pt was found sitting in wheelchair, leaning to the right.

## 2017-02-20 NOTE — ED Notes (Signed)
Patient still unable to provide urine sample and unwilling to be in and out cathed at this time. MD aware.

## 2017-02-20 NOTE — ED Notes (Signed)
Jesus DunkerAngela Riley, pt guardian from [employer] given update on pt plan of care.

## 2017-02-23 DIAGNOSIS — R079 Chest pain, unspecified: Secondary | ICD-10-CM | POA: Insufficient documentation

## 2017-02-23 NOTE — Progress Notes (Signed)
Cardiology Office Note  Date:  02/26/2017   ID:  Jesus PagesVictor Daigler, DOB Oct 23, 1928, MRN [id number]  PCP:  Marguarite ArbourSparks, Jeffrey D, MD   Chief Complaint  Patient presents with  . OTHER    F/u ED chest pain. Meds reviewed verbally with pt.    HPI:  Jesus Holland is a 84 y.o. male with a history of  dementia living at the [facility]  Diabetes Arthritis Who presents by referral from the emergency room for symptoms of chest pain November 2018  On today's visit he presents from the [facility] Denies active chest pain Discuss previous presentation to emergency room November 2018 for chest wall pain Does not remember details  Notes reviewed in EPIC Presented to the emergency room December 26, 2016 chief complaint of chest wall pain  started complaining of pain after his favorite caregiver left for the day.   No chest pain in the ER Workup unrevealing  EKG personally reviewed by myself on todays visit Shows normal sinus rhythm rate 93 bpm unable to exclude old inferior MI poor R-wave progression to the anterior precordial leads, possibly secondary to lead placement  PMH:   has a past medical history of Arthritis and Diabetes mellitus without complication (HCC).  PSH:    Past Surgical History:  Procedure Laterality Date  . none      Current Outpatient Medications  Medication Sig Dispense Refill  . acetaminophen (TYLENOL) 325 MG tablet Take 2 tablets (650 mg total) by mouth every 6 (six) hours as needed. 60 tablet 0  . Cyanocobalamin (VITAMIN B12) 1000 MCG TBCR Take 1 tablet by mouth daily.    . insulin glargine (LANTUS) 100 UNIT/ML injection Inject 0.3 mLs (30 Units total) into the skin daily. (Patient taking differently: Inject 30 Units into the skin at bedtime. ) 10 mL 11  . LORazepam (ATIVAN) 0.5 MG tablet Take 1 tablet (0.5 mg total) by mouth every 8 (eight) hours as needed for anxiety. 30 tablet 0  . losartan (COZAAR) 100 MG tablet Take 1 tablet (100 mg total) by mouth daily. 30 tablet 0  .  risperiDONE (RISPERDAL M-TABS) 2 MG disintegrating tablet Take 1 tablet (2 mg total) by mouth 2 (two) times daily. 60 tablet 1  . amLODipine (NORVASC) 10 MG tablet Take 1 tablet (10 mg total) by mouth daily. 180 tablet 3   No current facility-administered medications for this visit.      Allergies:   Aspirin; Escitalopram; Metformin; and Quinine   Social History:  The patient  reports that  has never smoked. he has never used smokeless tobacco. He reports that he does not drink alcohol or use drugs.   Family History:   Family history is unknown by patient.    Review of Systems: Review of Systems  Constitutional: Negative.   Respiratory: Negative.   Cardiovascular: Negative.   Gastrointestinal: Negative.   Musculoskeletal: Negative.   Neurological: Negative.   Psychiatric/Behavioral: Negative.   All other systems reviewed and are negative.    PHYSICAL EXAM: VS:  BP 108/60 (BP Location: Right Arm, Patient Position: Sitting, Cuff Size: Normal)   Pulse 93   Ht 5\' 10"  (1.778 m)   BMI 25.40 kg/m  , BMI Body mass index is 25.4 kg/m. GEN: Well nourished, well developed, in no acute distress  HEENT: normal  Neck: no JVD, carotid bruits, or masses Cardiac: RRR; 2/6 SEM RSB,  No rubs, or gallops, trace lower extremity edema  Respiratory:  clear to auscultation bilaterally, normal work of breathing GI:  soft, nontender, nondistended, + BS MS: no deformity or atrophy  Skin: warm and dry, no rash Neuro:  Strength and sensation are intact Psych: euthymic mood, full affect    Recent Labs: 10/24/2016: Magnesium 1.6 02/20/2017: ALT 11; BUN 26; Creatinine, Ser 1.15; Hemoglobin 13.0; Platelets 240; Potassium 3.3; Sodium 140    Lipid Panel Lab Results  Component Value Date   CHOL 176 06/25/2013   HDL 40 06/25/2013   LDLCALC 101 (H) 06/25/2013   TRIG 175 06/25/2013      Wt Readings from Last 3 Encounters:  02/20/17 177 lb (80.3 kg)  12/26/16 172 lb (78 kg)  12/14/16 172 lb (78  kg)       ASSESSMENT AND PLAN:  Late onset Alzheimer's disease with behavioral disturbance Notes indicating dementia Managed by primary care  Chest pain with moderate risk for cardiac etiology - Plan: EKG 12-Lead, ECHOCARDIOGRAM COMPLETE Atypical chest pain it would seem from the emergency room notes Echocardiogram as below given concern for aortic valve disease  Essential hypertension Blood pressure running low on today's visit Consider close monitoring at the [facility] and decrease amlodipine if needed  Hyperglycemia History of diabetes On insulin  Murmur, cardiac As below concerning for significant aortic valve disease Echocardiogram ordered at his convenience  Aortic valve disease Clinical exam concerning for aortic valve stenosis High-pitched systolic murmur Unable to exclude severe stenosis, Given clinical finding we have recommended echocardiogram for further evaluation  Disposition:   We will call with results of echocardiogram and further workup if needed    Total encounter time more than 45 minutes  Greater than 50% was spent in counseling and coordination of care with the patient   Orders Placed This Encounter  Procedures  . EKG 12-Lead  . ECHOCARDIOGRAM COMPLETE     Signed, Dossie Arbour, M.D., Ph.D. 02/26/2017  [facility] [place], [place] [phone]

## 2017-02-26 ENCOUNTER — Encounter: Payer: Self-pay | Admitting: Cardiovascular Disease

## 2017-02-26 ENCOUNTER — Ambulatory Visit (INDEPENDENT_AMBULATORY_CARE_PROVIDER_SITE_OTHER): Payer: Medicare Other | Admitting: Cardiovascular Disease

## 2017-02-26 VITALS — BP 108/60 | HR 93 | Ht 70.0 in

## 2017-02-26 DIAGNOSIS — R079 Chest pain, unspecified: Secondary | ICD-10-CM

## 2017-02-26 DIAGNOSIS — G301 Alzheimer's disease with late onset: Secondary | ICD-10-CM | POA: Diagnosis not present

## 2017-02-26 DIAGNOSIS — I359 Nonrheumatic aortic valve disorder, unspecified: Secondary | ICD-10-CM

## 2017-02-26 DIAGNOSIS — R011 Cardiac murmur, unspecified: Secondary | ICD-10-CM

## 2017-02-26 DIAGNOSIS — R739 Hyperglycemia, unspecified: Secondary | ICD-10-CM | POA: Diagnosis not present

## 2017-02-26 DIAGNOSIS — F02818 Dementia in other diseases classified elsewhere, unspecified severity, with other behavioral disturbance: Secondary | ICD-10-CM

## 2017-02-26 DIAGNOSIS — F0281 Dementia in other diseases classified elsewhere with behavioral disturbance: Secondary | ICD-10-CM

## 2017-02-26 MED ORDER — AMLODIPINE BESYLATE 10 MG PO TABS: 10.0000 mg | ORAL_TABLET | Freq: Every day | ORAL | 3 refills | Status: DC

## 2017-02-26 NOTE — Progress Notes (Signed)
Refill sent in for carvedilol and we are not managing that medication. Called pharmacy and advised that was sent in error. They removed from pharmacy and notified caretaker that came with patient that was sent in error.

## 2017-02-26 NOTE — Addendum Note (Signed)
Addended by: Bryna ColanderALLEN, Shelsy Seng S on: 02/26/2017 11:18 AM   Modules accepted: Orders

## 2017-02-26 NOTE — Patient Instructions (Signed)
Medication Instructions:  No changes  Labwork: No changes  Testing/Procedures: Your physician has requested that you have an echocardiogram. Echocardiography is a painless test that uses sound waves to create images of your heart. It provides your doctor with information about the size and shape of your heart and how well your heart's chambers and valves are working. This procedure takes approximately one hour. There are no restrictions for this procedure.    Follow-Up: Your physician wants you to follow-up once echocardiogram has been done.

## 2017-02-28 ENCOUNTER — Other Ambulatory Visit: Payer: Self-pay | Admitting: Cardiovascular Disease

## 2017-02-28 DIAGNOSIS — I359 Nonrheumatic aortic valve disorder, unspecified: Secondary | ICD-10-CM

## 2017-02-28 DIAGNOSIS — R079 Chest pain, unspecified: Secondary | ICD-10-CM

## 2017-03-05 ENCOUNTER — Ambulatory Visit (INDEPENDENT_AMBULATORY_CARE_PROVIDER_SITE_OTHER): Payer: Medicare Other

## 2017-03-05 ENCOUNTER — Other Ambulatory Visit: Payer: Self-pay

## 2017-03-05 DIAGNOSIS — I359 Nonrheumatic aortic valve disorder, unspecified: Secondary | ICD-10-CM | POA: Diagnosis not present

## 2017-03-05 DIAGNOSIS — R079 Chest pain, unspecified: Secondary | ICD-10-CM

## 2017-03-05 LAB — ECHOCARDIOGRAM COMPLETE
AO mean calculated velocity dopler: 218 cm/s
AOVTI: 60 cm
AV Area VTI index: 0.52 cm2/m2
AV Mean grad: 22 mmHg
AV VEL mean LVOT/AV: 0.33
AVAREAMEANV: 1.04 cm2
AVAREAMEANVIN: 0.52 cm2/m2
Ao-asc: 31 cm
Area-P 1/2: 1.98 cm2
CHL CUP AV VALUE AREA INDEX: 0.52
CHL CUP AV VEL: 1.03
CHL CUP DOP CALC LVOT VTI: 19.7 cm
CHL CUP LVOT MV VTI: 2.27
CHL CUP MV DEC (S): 331
E decel time: 331 msec
E/e' ratio: 14.14
FS: 50 % — AB (ref 28–44)
IVS/LV PW RATIO, ED: 0.93
LA diam end sys: 25 mm
LA diam index: 1.26 cm/m2
LASIZE: 25 mm
LAVOLA4C: 33.5 mL
LDCA: 3.14 cm2
LV E/e' medial: 14.14
LV E/e'average: 14.14
LV PW d: 15 mm — AB (ref 0.6–1.1)
LVELAT: 5.15 cm/s
LVOT MV VTI INDEX: 1.15 cm2/m2
LVOTD: 20 mm
LVOTSV: 62 mL
LVOTVTI: 0.33 cm
MV M vel: 61.2
MV Peak grad: 2 mmHg
MV pk E vel: 72.8 m/s
MVANNULUSVTI: 27.2 cm
MVPKAVEL: 101 m/s
Mean grad: 2 mmHg
P 1/2 time: 97 ms
RV LATERAL S' VELOCITY: 12.7 cm/s
RV TAPSE: 19.5 mm
TDI e' lateral: 5.15
TDI e' medial: 5.43
Valve area: 1.03 cm2

## 2017-03-13 ENCOUNTER — Telehealth: Payer: Self-pay | Admitting: *Deleted

## 2017-03-13 DIAGNOSIS — I359 Nonrheumatic aortic valve disorder, unspecified: Secondary | ICD-10-CM

## 2017-03-13 NOTE — Telephone Encounter (Addendum)
Spoke with patients service provider for guardianship and reviewed results and recommendations for repeat testing every other year. She verbalized understanding with no further questions at this time. Provided her with our fax number for her to update his chart with contact information.  Jesus Holland [phone]

## 2017-03-16 ENCOUNTER — Emergency Department: Payer: Medicare Other

## 2017-03-16 ENCOUNTER — Other Ambulatory Visit: Payer: Self-pay

## 2017-03-16 ENCOUNTER — Emergency Department
Admission: EM | Admit: 2017-03-16 | Discharge: 2017-03-16 | Disposition: A | Discharge status: Home / Self Care | Payer: Medicare Other | Source: Home / Self Care | Attending: Emergency Medicine | Admitting: Emergency Medicine

## 2017-03-16 DIAGNOSIS — G309 Alzheimer's disease, unspecified: Secondary | ICD-10-CM | POA: Insufficient documentation

## 2017-03-16 DIAGNOSIS — Y939 Activity, unspecified: Secondary | ICD-10-CM

## 2017-03-16 DIAGNOSIS — Z79899 Other long term (current) drug therapy: Secondary | ICD-10-CM | POA: Insufficient documentation

## 2017-03-16 DIAGNOSIS — Y999 Unspecified external cause status: Secondary | ICD-10-CM | POA: Insufficient documentation

## 2017-03-16 DIAGNOSIS — S0990XA Unspecified injury of head, initial encounter: Secondary | ICD-10-CM

## 2017-03-16 DIAGNOSIS — Z8679 Personal history of other diseases of the circulatory system: Secondary | ICD-10-CM | POA: Insufficient documentation

## 2017-03-16 DIAGNOSIS — E119 Type 2 diabetes mellitus without complications: Secondary | ICD-10-CM

## 2017-03-16 DIAGNOSIS — Y92129 Unspecified place in nursing home as the place of occurrence of the external cause: Secondary | ICD-10-CM

## 2017-03-16 DIAGNOSIS — A419 Sepsis, unspecified organism: Secondary | ICD-10-CM | POA: Diagnosis not present

## 2017-03-16 DIAGNOSIS — F0280 Dementia in other diseases classified elsewhere without behavioral disturbance: Secondary | ICD-10-CM

## 2017-03-16 DIAGNOSIS — W01198A Fall on same level from slipping, tripping and stumbling with subsequent striking against other object, initial encounter: Secondary | ICD-10-CM | POA: Insufficient documentation

## 2017-03-16 DIAGNOSIS — R6510 Systemic inflammatory response syndrome (SIRS) of non-infectious origin without acute organ dysfunction: Secondary | ICD-10-CM | POA: Diagnosis not present

## 2017-03-16 DIAGNOSIS — Z794 Long term (current) use of insulin: Secondary | ICD-10-CM

## 2017-03-16 DIAGNOSIS — W19XXXA Unspecified fall, initial encounter: Secondary | ICD-10-CM

## 2017-03-16 LAB — URINALYSIS, COMPLETE (UACMP) WITH MICROSCOPIC
Bacteria, UA: NONE SEEN
Bilirubin Urine: NEGATIVE
GLUCOSE, UA: 50 mg/dL — AB
Hgb urine dipstick: NEGATIVE
KETONES UR: NEGATIVE mg/dL
Leukocytes, UA: NEGATIVE
Nitrite: NEGATIVE
PH: 5 (ref 5.0–8.0)
Protein, ur: NEGATIVE mg/dL
SPECIFIC GRAVITY, URINE: 1.016 (ref 1.005–1.030)
Squamous Epithelial / LPF: NONE SEEN

## 2017-03-16 LAB — COMPREHENSIVE METABOLIC PANEL
ALK PHOS: 78 U/L (ref 38–126)
ALT: 14 U/L — AB (ref 17–63)
AST: 21 U/L (ref 15–41)
Albumin: 3.1 g/dL — ABNORMAL LOW (ref 3.5–5.0)
Anion gap: 12 (ref 5–15)
BILIRUBIN TOTAL: 0.5 mg/dL (ref 0.3–1.2)
BUN: 14 mg/dL (ref 6–20)
CALCIUM: 8.4 mg/dL — AB (ref 8.9–10.3)
CO2: 19 mmol/L — ABNORMAL LOW (ref 22–32)
CREATININE: 0.66 mg/dL (ref 0.61–1.24)
Chloride: 109 mmol/L (ref 101–111)
GFR calc Af Amer: >60 mL/min (ref >60)
GLUCOSE: 123 mg/dL — AB (ref 65–99)
Potassium: 3.1 mmol/L — ABNORMAL LOW (ref 3.5–5.1)
Sodium: 140 mmol/L (ref 135–145)
TOTAL PROTEIN: 6.2 g/dL — AB (ref 6.5–8.1)

## 2017-03-16 LAB — CBC WITH DIFFERENTIAL/PLATELET
BASOS ABS: 0 10*3/uL (ref 0–0.1)
Basophils Relative: 0 %
EOS ABS: 0.3 10*3/uL (ref 0–0.7)
EOS PCT: 3 %
HCT: 34.1 % — ABNORMAL LOW (ref 40.0–52.0)
Hemoglobin: 12 g/dL — ABNORMAL LOW (ref 13.0–18.0)
LYMPHS ABS: 0.9 10*3/uL — AB (ref 1.0–3.6)
Lymphocytes Relative: 10 %
MCH: 31.5 pg (ref 26.0–34.0)
MCHC: 35.1 g/dL (ref 32.0–36.0)
MCV: 89.6 fL (ref 80.0–100.0)
Monocytes Absolute: 0.6 10*3/uL (ref 0.2–1.0)
Monocytes Relative: 6 %
Neutro Abs: 7.6 10*3/uL — ABNORMAL HIGH (ref 1.4–6.5)
Neutrophils Relative %: 81 %
PLATELETS: 306 10*3/uL (ref 150–440)
RBC: 3.81 MIL/uL — ABNORMAL LOW (ref 4.40–5.90)
RDW: 14.2 % (ref 11.5–14.5)
WBC: 9.4 10*3/uL (ref 3.8–10.6)

## 2017-03-16 LAB — TROPONIN I

## 2017-03-16 LAB — GLUCOSE, CAPILLARY: Glucose-Capillary: 117 mg/dL — ABNORMAL HIGH (ref 65–99)

## 2017-03-16 MED ORDER — SODIUM CHLORIDE 0.9 % IV BOLUS (SEPSIS)
500.0000 mL | Freq: Once | INTRAVENOUS | Status: AC
  Administered 2017-03-16: 500 mL via INTRAVENOUS

## 2017-03-16 MED ORDER — ACETAMINOPHEN 325 MG PO TABS
650.0000 mg | ORAL_TABLET | Freq: Once | ORAL | Status: AC
Start: 2017-03-16 — End: 2017-03-16
  Administered 2017-03-16: 650 mg via ORAL
  Filled 2017-03-16: qty 2

## 2017-03-16 MED ORDER — ACETAMINOPHEN 325 MG PO TABS
ORAL_TABLET | ORAL | Status: AC
  Filled 2017-03-16: qty 1

## 2017-03-16 NOTE — ED Notes (Signed)
Pt continues to cry out in pain, unable to pinpoint where pain is but able to state his pain is 9/10. Informed Dr. Alphonzo LemmingsMcShane.

## 2017-03-16 NOTE — ED Provider Notes (Signed)
[facility] Emergency Department Provider Note  ____________________________________________   I have reviewed the triage vital signs and the nursing notes. Where available I have reviewed prior notes and, if possible and indicated, outside hospital notes.    HISTORY  Chief Complaint Fall    HPI Jesus Holland is a 84 y.o. male with a history of difficult dementia, was found down.  Patient apparently fell.  Tells me that he slipped.  It was an unwitnessed fall.  He has complaints of feeling generalized pain after his fall.  He states he did bump his head he did not pass out.  He did hit his head.  Bumped it against the wall he states.  EMS back to stop and states he is at his baseline.  I also discussed with Tiffany at the nursing home they also state he is at his baseline.    Past Medical History:  Diagnosis Date  . Arthritis   . Diabetes mellitus without complication [facility])     Patient Active Problem List   Diagnosis Date Noted  . Murmur, cardiac 02/26/2017  . Aortic valve disease 02/26/2017  . Chest pain with moderate risk for cardiac etiology 02/23/2017  . Psychosis (HCC) 10/29/2016  . Hyperglycemia 10/23/2016  . Dementia, Alzheimer's, with behavior disturbance 10/23/2016    Past Surgical History:  Procedure Laterality Date  . none      Prior to Admission medications   Medication Sig Start Date End Date Taking? Authorizing Provider  acetaminophen (TYLENOL) 325 MG tablet Take 2 tablets (650 mg total) by mouth every 6 (six) hours as needed. 11/17/16  Yes Sharman Cheek, MD  amLODipine (NORVASC) 10 MG tablet Take 10 mg by mouth daily.   Yes [provider]  Cyanocobalamin (VITAMIN B12) 1000 MCG TBCR Take 1 tablet by mouth daily.   Yes [provider]  docusate sodium (COLACE) 100 MG capsule Take 100 mg by mouth daily.   Yes [provider]  insulin glargine (LANTUS) 100 UNIT/ML injection Inject 0.3 mLs (30 Units total)  into the skin daily. Patient taking differently: Inject 30 Units into the skin at bedtime.  11/16/16  Yes Katha Hamming, MD  losartan (COZAAR) 100 MG tablet Take 1 tablet (100 mg total) by mouth daily. 11/13/16  Yes Katha Hamming, MD  risperiDONE (RISPERDAL M-TABS) 2 MG disintegrating tablet Take 1 tablet (2 mg total) by mouth 2 (two) times daily. 11/15/16  Yes Clapacs, Jackquline Denmark, MD  LORazepam (ATIVAN) 0.5 MG tablet Take 1 tablet (0.5 mg total) by mouth every 8 (eight) hours as needed for anxiety. Patient not taking: Reported on 03/16/2017 11/12/16   Katha Hamming, MD    Allergies Aspirin; Escitalopram; Metformin; and Quinine  Family History  Family history unknown: Yes    Social History Social History   Tobacco Use  . Smoking status: Never Smoker  . Smokeless tobacco: Never Used  Substance Use Topics  . Alcohol use: No  . Drug use: No    Review of Systems Constitutional: No fever/chills Eyes: No visual changes. ENT: No sore throat. No stiff neck no neck pain Cardiovascular: Denies chest pain. Respiratory: Denies shortness of breath. Gastrointestinal:   no vomiting.  No diarrhea.  No constipation. Genitourinary: Negative for dysuria. Musculoskeletal: Negative lower extremity swelling Skin: Negative for rash. Neurological: Negative for severe headaches, focal weakness or numbness.   ____________________________________________   PHYSICAL EXAM:  VITAL SIGNS: ED Triage Vitals  Enc Vitals Group     BP 03/16/17 1205 139/68  Pulse Rate 03/16/17 1205 100     Resp 03/16/17 1205 16     Temp 03/16/17 1205 98 F (36.7 C)     Temp Source 03/16/17 1205 Oral     SpO2 03/16/17 1205 97 %     Weight 03/16/17 1206 164 lb 8 oz (74.6 kg)     Height 03/16/17 1206 5\' 10"  (1.778 m)     Head Circumference --      Peak Flow --      Pain Score 03/16/17 1331 9     Pain Loc --      Pain Edu? --      Excl. in GC? --     Constitutional: Alert and pleasantly demented.  Well appearing and in no acute distress. Eyes: Conjunctivae are normal Head: Atraumatic HEENT: No congestion/rhinnorhea. Mucous membranes are moist.  Oropharynx non-erythematous Neck:   Nontender with no meningismus, no masses, no stridor Cardiovascular: Normal rate, regular rhythm. Grossly normal heart sounds.  Good peripheral circulation. Respiratory: Normal respiratory effort.  No retractions. Lungs CTAB. Abdominal: Soft and nontender. No distention. No guarding no rebound Back:  There is no focal tenderness or step off.  there is no midline tenderness there are no lesions noted. there is no CVA tenderness Musculoskeletal: No lower extremity tenderness, no upper extremity tenderness. No joint effusions, no DVT signs strong distal pulses no edema Neurologic:  Normal speech and language. No gross focal neurologic deficits are appreciated.  Skin:  Skin is warm, dry and intact. No rash noted. Psychiatric: Mood and affect are normal. Speech and behavior are normal.  ____________________________________________   LABS (all labs ordered are listed, but only abnormal results are displayed)  Labs Reviewed - No data to display  Pertinent labs  results that were available during my care of the patient were reviewed by me and considered in my medical decision making (see chart for details). ____________________________________________  EKG  I personally interpreted any EKGs ordered by me or triage  ____________________________________________  RADIOLOGY  Pertinent labs & imaging results that were available during my care of the patient were reviewed by me and considered in my medical decision making (see chart for details). If possible, patient and/or family made aware of any abnormal findings.  Ct Head Wo Contrast  Result Date: 03/16/2017 CLINICAL DATA:  Pt arrives via ems from the [place], pt had an unwitnessed fall striking his head on the corner of the wall EXAM: CT HEAD WITHOUT CONTRAST  CT CERVICAL SPINE WITHOUT CONTRAST TECHNIQUE: Multidetector CT imaging of the head and cervical spine was performed following the standard protocol without intravenous contrast. Multiplanar CT image reconstructions of the cervical spine were also generated. COMPARISON:  02/20/2017 FINDINGS: CT HEAD FINDINGS Brain: No evidence of acute infarction, hemorrhage, extra-axial collection, ventriculomegaly, or mass effect. Generalized cerebral atrophy. Periventricular white matter low attenuation likely secondary to microangiopathy. Vascular: Cerebrovascular atherosclerotic calcifications are noted. Skull: Negative for fracture or focal lesion. Sinuses/Orbits: Visualized portions of the orbits are unremarkable. Visualized portions of the paranasal sinuses and mastoid air cells are unremarkable. Other: None. CT CERVICAL SPINE FINDINGS Alignment: Normal. Skull base and vertebrae: No acute fracture. No primary bone lesion or focal pathologic process. Soft tissues and spinal canal: No prevertebral fluid or swelling. No visible canal hematoma. Disc levels: Degenerative disc disease with disc height loss at C3-4, C4-5, C5-6 and C6-7. Broad-based disc osteophyte complexes at C3-4, C4-5, C5-6 and C6-7 with bilateral uncovertebral degenerative changes and foraminal encroachment. Ankylosis of the left C3-4 facet  joints. Ankylosis of the right C4-5 facet joints. Upper chest: Lung apices are clear. Other: No fluid collection or hematoma. IMPRESSION: 1. No acute intracranial pathology. 2.  No acute osseous injury of the cervical spine. Electronically Signed   By: Elige KoHetal  Patel   On: 03/16/2017 13:19   Ct Cervical Spine Wo Contrast  Result Date: 03/16/2017 CLINICAL DATA:  Pt arrives via ems from the [facility], pt had an unwitnessed fall striking his head on the corner of the wall EXAM: CT HEAD WITHOUT CONTRAST CT CERVICAL SPINE WITHOUT CONTRAST TECHNIQUE: Multidetector CT imaging of the head and cervical spine was performed following the  standard protocol without intravenous contrast. Multiplanar CT image reconstructions of the cervical spine were also generated. COMPARISON:  02/20/2017 FINDINGS: CT HEAD FINDINGS Brain: No evidence of acute infarction, hemorrhage, extra-axial collection, ventriculomegaly, or mass effect. Generalized cerebral atrophy. Periventricular white matter low attenuation likely secondary to microangiopathy. Vascular: Cerebrovascular atherosclerotic calcifications are noted. Skull: Negative for fracture or focal lesion. Sinuses/Orbits: Visualized portions of the orbits are unremarkable. Visualized portions of the paranasal sinuses and mastoid air cells are unremarkable. Other: None. CT CERVICAL SPINE FINDINGS Alignment: Normal. Skull base and vertebrae: No acute fracture. No primary bone lesion or focal pathologic process. Soft tissues and spinal canal: No prevertebral fluid or swelling. No visible canal hematoma. Disc levels: Degenerative disc disease with disc height loss at C3-4, C4-5, C5-6 and C6-7. Broad-based disc osteophyte complexes at C3-4, C4-5, C5-6 and C6-7 with bilateral uncovertebral degenerative changes and foraminal encroachment. Ankylosis of the left C3-4 facet joints. Ankylosis of the right C4-5 facet joints. Upper chest: Lung apices are clear. Other: No fluid collection or hematoma. IMPRESSION: 1. No acute intracranial pathology. 2.  No acute osseous injury of the cervical spine. Electronically Signed   By: Elige KoHetal  Patel   On: 03/16/2017 13:19   Dg Hip Unilat W Or Wo Pelvis 2-3 Views Left  Result Date: 03/16/2017 CLINICAL DATA:  pt had an unwitnessed fall striking his head on the corner of the wall, Pt unable to move either leg/hip, appears to be "frozen." unable to flex either leg, or raise either leg. Best images possible. Pt was in no pain EXAM: DG HIP (WITH OR WITHOUT PELVIS) 2-3V LEFT COMPARISON:  02/20/2017 and previous FINDINGS: There is no evidence of hip fracture or dislocation. Early marginal  spurring in both hips. No other arthropathy or focal bone abnormality. Bilateral pelvic phleboliths. IMPRESSION: 1. No acute findings. 2. Early bilateral hip degenerative spurring. Electronically Signed   By: Corlis Leak  Hassell M.D.   On: 03/16/2017 13:45   Dg Hip Unilat W Or Wo Pelvis 2-3 Views Right  Result Date: 03/16/2017 CLINICAL DATA:  pt had an unwitnessed fall striking his head on the corner of the wall, Pt unable to move either leg/hip, appears to be "frozen." unable to flex either leg, or raise either leg. Best images possible. EXAM: DG HIP (WITH OR WITHOUT PELVIS) 2-3V RIGHT COMPARISON:  02/20/2017 FINDINGS: There is no evidence of hip fracture or dislocation. Early marginal spurring in both hips. There is no evidence of arthropathy or other focal bone abnormality. Bilateral pelvic phleboliths. IMPRESSION: 1. No acute findings. 2. Early bilateral hip degenerative spurring. Electronically Signed   By: Corlis Leak  Hassell M.D.   On: 03/16/2017 13:31   ____________________________________________    PROCEDURES  Procedure(s) performed: None  Procedures  Critical Care performed: None  ____________________________________________   INITIAL IMPRESSION / ASSESSMENT AND PLAN / ED COURSE  Pertinent labs & imaging results  that were available during my care of the patient were reviewed by me and considered in my medical decision making (see chart for details).  Patient here after a non-syncopal fall, he is in no acute distress, he does state he has "pain" but he really will not tell me where he just says that he is uncomfortable.  I do not detect any acute injury but nonetheless I did a CT of the head and neck and hips and they are negative, this concordance with my exam.  Will also obtain x-ray of his lower back and chest as a precaution we will check her blood sugar.  He was in his normal state of health prior to this and I do not see any obvious evidence of full pathology.  If imaging is reassuring it is my  hope we can get him safely home.  Did discuss with the nursing home and they are in accordance with this plan.    ____________________________________________   FINAL CLINICAL IMPRESSION(S) / ED DIAGNOSES  Final diagnoses:  None      This chart was dictated using voice recognition software.  Despite best efforts to proofread,  errors can occur which can change meaning.      Jeanmarie Plant, MD 03/16/17 [phone]

## 2017-03-16 NOTE — ED Notes (Signed)
Attempted to call [facility] re: pt's status, no answer x 2.

## 2017-03-16 NOTE — ED Notes (Signed)
Pt was incontinent of urine so cleaned and new diaper applied, we attempted to stand pt for evaluation but he is unable to even sit alone on side of bed so we did not stand him.

## 2017-03-16 NOTE — ED Triage Notes (Signed)
Pt arrives via ems from the [facility], pt had an unwitnessed fall striking his head on the corner of the wall, pt has skin tears and is keeping the left leg bent as if he is having pain

## 2017-03-16 NOTE — ED Provider Notes (Signed)
Patient is in no distress stable.  Labs are unremarkable.   Emily FilbertWilliams, Jonathan E, MD 03/16/17 [phone]

## 2017-03-17 ENCOUNTER — Other Ambulatory Visit: Payer: Self-pay

## 2017-03-17 ENCOUNTER — Emergency Department: Payer: Medicare Other

## 2017-03-17 ENCOUNTER — Emergency Department
Admission: EM | Admit: 2017-03-17 | Discharge: 2017-03-17 | Disposition: A | Discharge status: Home / Self Care | Payer: Medicare Other | Source: Home / Self Care | Attending: Emergency Medicine | Admitting: Emergency Medicine

## 2017-03-17 DIAGNOSIS — R5381 Other malaise: Secondary | ICD-10-CM

## 2017-03-17 DIAGNOSIS — G308 Other Alzheimer's disease: Secondary | ICD-10-CM | POA: Insufficient documentation

## 2017-03-17 DIAGNOSIS — E1165 Type 2 diabetes mellitus with hyperglycemia: Secondary | ICD-10-CM

## 2017-03-17 DIAGNOSIS — Z79899 Other long term (current) drug therapy: Secondary | ICD-10-CM | POA: Insufficient documentation

## 2017-03-17 DIAGNOSIS — F0281 Dementia in other diseases classified elsewhere with behavioral disturbance: Secondary | ICD-10-CM

## 2017-03-17 DIAGNOSIS — Z794 Long term (current) use of insulin: Secondary | ICD-10-CM

## 2017-03-17 LAB — CBC WITH DIFFERENTIAL/PLATELET
BASOS PCT: 1 %
Basophils Absolute: 0.1 10*3/uL (ref 0–0.1)
EOS ABS: 0.2 10*3/uL (ref 0–0.7)
Eosinophils Relative: 2 %
HCT: 35.9 % — ABNORMAL LOW (ref 40.0–52.0)
HEMOGLOBIN: 12.7 g/dL — AB (ref 13.0–18.0)
Lymphocytes Relative: 8 %
Lymphs Abs: 0.8 10*3/uL — ABNORMAL LOW (ref 1.0–3.6)
MCH: 31.8 pg (ref 26.0–34.0)
MCHC: 35.4 g/dL (ref 32.0–36.0)
MCV: 89.7 fL (ref 80.0–100.0)
MONOS PCT: 6 %
Monocytes Absolute: 0.6 10*3/uL (ref 0.2–1.0)
NEUTROS PCT: 83 %
Neutro Abs: 7.8 10*3/uL — ABNORMAL HIGH (ref 1.4–6.5)
PLATELETS: 321 10*3/uL (ref 150–440)
RBC: 4 MIL/uL — ABNORMAL LOW (ref 4.40–5.90)
RDW: 14 % (ref 11.5–14.5)
WBC: 9.4 10*3/uL (ref 3.8–10.6)

## 2017-03-17 LAB — COMPREHENSIVE METABOLIC PANEL
ALBUMIN: 3.1 g/dL — AB (ref 3.5–5.0)
ALT: 14 U/L — ABNORMAL LOW (ref 17–63)
AST: 21 U/L (ref 15–41)
Alkaline Phosphatase: 83 U/L (ref 38–126)
Anion gap: 10 (ref 5–15)
BILIRUBIN TOTAL: 0.8 mg/dL (ref 0.3–1.2)
BUN: 11 mg/dL (ref 6–20)
CHLORIDE: 108 mmol/L (ref 101–111)
CO2: 22 mmol/L (ref 22–32)
Calcium: 8.5 mg/dL — ABNORMAL LOW (ref 8.9–10.3)
Creatinine, Ser: 0.69 mg/dL (ref 0.61–1.24)
GFR calc Af Amer: >60 mL/min (ref >60)
GFR calc non Af Amer: >60 mL/min (ref >60)
GLUCOSE: 120 mg/dL — AB (ref 65–99)
POTASSIUM: 3.4 mmol/L — AB (ref 3.5–5.1)
Sodium: 140 mmol/L (ref 135–145)
Total Protein: 6.4 g/dL — ABNORMAL LOW (ref 6.5–8.1)

## 2017-03-17 LAB — URINALYSIS, COMPLETE (UACMP) WITH MICROSCOPIC
Bacteria, UA: NONE SEEN
Bilirubin Urine: NEGATIVE
GLUCOSE, UA: NEGATIVE mg/dL
Hgb urine dipstick: NEGATIVE
KETONES UR: 20 mg/dL — AB
Leukocytes, UA: NEGATIVE
NITRITE: NEGATIVE
PH: 6 (ref 5.0–8.0)
Protein, ur: NEGATIVE mg/dL
SPECIFIC GRAVITY, URINE: 1.012 (ref 1.005–1.030)

## 2017-03-17 LAB — TROPONIN I: Troponin I: <0.03 ng/mL (ref <0.03)

## 2017-03-17 MED ORDER — MORPHINE SULFATE (PF) 2 MG/ML IV SOLN
2.0000 mg | Freq: Once | INTRAVENOUS | Status: AC
  Administered 2017-03-17: 2 mg via INTRAVENOUS
  Filled 2017-03-17: qty 1

## 2017-03-17 NOTE — Discharge Instructions (Signed)
Please follow-up with your primary care physician this coming Monday for reevaluation.  Return to the emergency department sooner for any concerns.  It was a pleasure to take care of you today, and thank you for coming to our emergency department.  If you have any questions or concerns before leaving please ask the nurse to grab me and I'm more than happy to go through your aftercare instructions again.  If you were prescribed any opioid pain medication today such as Norco, Vicodin, Percocet, morphine, hydrocodone, or oxycodone please make sure you do not drive when you are taking this medication as it can alter your ability to drive safely.  If you have any concerns once you are home that you are not improving or are in fact getting worse before you can make it to your follow-up appointment, please do not hesitate to call 911 and come back for further evaluation.  Merrily Brittle, MD  Results for orders placed or performed during the hospital encounter of 03/17/17  Comprehensive metabolic panel  Result Value Ref Range   Sodium 140 135 - 145 mmol/L   Potassium 3.4 (L) 3.5 - 5.1 mmol/L   Chloride 108 101 - 111 mmol/L   CO2 22 22 - 32 mmol/L   Glucose, Bld 120 (H) 65 - 99 mg/dL   BUN 11 6 - 20 mg/dL   Creatinine, Ser 1.61 0.61 - 1.24 mg/dL   Calcium 8.5 (L) 8.9 - 10.3 mg/dL   Total Protein 6.4 (L) 6.5 - 8.1 g/dL   Albumin 3.1 (L) 3.5 - 5.0 g/dL   AST 21 15 - 41 U/L   ALT 14 (L) 17 - 63 U/L   Alkaline Phosphatase 83 38 - 126 U/L   Total Bilirubin 0.8 0.3 - 1.2 mg/dL   GFR calc non Af Amer >60 >60 mL/min   GFR calc Af Amer >60 >60 mL/min   Anion gap 10 5 - 15  Troponin I  Result Value Ref Range   Troponin I <0.03 <0.03 ng/mL  CBC with Differential  Result Value Ref Range   WBC 9.4 3.8 - 10.6 K/uL   RBC 4.00 (L) 4.40 - 5.90 MIL/uL   Hemoglobin 12.7 (L) 13.0 - 18.0 g/dL   HCT 09.6 (L) 04.5 - 40.9 %   MCV 89.7 80.0 - 100.0 fL   MCH 31.8 26.0 - 34.0 pg   MCHC 35.4 32.0 - 36.0 g/dL   RDW 81.1 91.4 - 78.2 %   Platelets 321 150 - 440 K/uL   Neutrophils Relative % 83 %   Neutro Abs 7.8 (H) 1.4 - 6.5 K/uL   Lymphocytes Relative 8 %   Lymphs Abs 0.8 (L) 1.0 - 3.6 K/uL   Monocytes Relative 6 %   Monocytes Absolute 0.6 0.2 - 1.0 K/uL   Eosinophils Relative 2 %   Eosinophils Absolute 0.2 0 - 0.7 K/uL   Basophils Relative 1 %   Basophils Absolute 0.1 0 - 0.1 K/uL  Urinalysis, Complete w Microscopic  Result Value Ref Range   Color, Urine YELLOW (A) YELLOW   APPearance CLEAR (A) CLEAR   Specific Gravity, Urine 1.012 1.005 - 1.030   pH 6.0 5.0 - 8.0   Glucose, UA NEGATIVE NEGATIVE mg/dL   Hgb urine dipstick NEGATIVE NEGATIVE   Bilirubin Urine NEGATIVE NEGATIVE   Ketones, ur 20 (A) NEGATIVE mg/dL   Protein, ur NEGATIVE NEGATIVE mg/dL   Nitrite NEGATIVE NEGATIVE   Leukocytes, UA NEGATIVE NEGATIVE   RBC / HPF 0-5 0 -  5 RBC/hpf   WBC, UA 0-5 0 - 5 WBC/hpf   Bacteria, UA NONE SEEN NONE SEEN   Squamous Epithelial / LPF 0-5 (A) NONE SEEN   Mucus PRESENT    Hyaline Casts, UA PRESENT    Dg Chest 1 View  Result Date: 03/17/2017 CLINICAL DATA:  Acute onset weakness and shortness of breath. EXAM: CHEST 1 VIEW COMPARISON:  03/16/2017 FINDINGS: Slightly shallow inspiration. Heart size and pulmonary vascularity are normal. Lungs are clear. No airspace disease or consolidation. Aortic calcification. No blunting of costophrenic angles. No pneumothorax. Degenerative changes in the spine. IMPRESSION: No evidence of active pulmonary disease.  Aortic atherosclerosis. Electronically Signed   By: Burman Nieves M.D.   On: 03/17/2017 01:12   Dg Chest 1 View  Result Date: 03/16/2017 CLINICAL DATA:  Status post fall.  Dementia. EXAM: CHEST 1 VIEW COMPARISON:  02/20/2017 FINDINGS: Enlarged heart. Mediastinal contours appear intact. Calcific atherosclerotic disease of the aorta. There is no evidence of focal airspace consolidation, pleural effusion or pneumothorax. Osseous structures are without  acute abnormality. Soft tissues are grossly normal. IMPRESSION: Enlarged heart. Calcific atherosclerotic disease of the aorta. No displaced fractures seen. Electronically Signed   By: Ted Mcalpine M.D.   On: 03/16/2017 15:07   Dg Chest 1 View  Result Date: 02/20/2017 CLINICAL DATA:  Altered mental status and questionable fall EXAM: CHEST 1 VIEW COMPARISON:  December 26, 2016 FINDINGS: There is no evident edema or consolidation. Heart is upper normal in size with pulmonary vascularity within normal limits. No adenopathy. There is aortic atherosclerosis. No pneumothorax. There is degenerative change in the thoracic spine. No acute fracture evident. IMPRESSION: No edema or consolidation. There is aortic atherosclerosis. No evident pneumothorax. Aortic Atherosclerosis (ICD10-I70.0). Electronically Signed   By: Bretta Bang III M.D.   On: 02/20/2017 08:13   Dg Lumbar Spine Complete  Result Date: 03/16/2017 CLINICAL DATA:  Fall, generalized pain EXAM: LUMBAR SPINE - COMPLETE 4+ VIEW COMPARISON:  None. FINDINGS: Five lumbar-type vertebral bodies. Normal lumbar lordosis. No evidence of fracture or dislocation. Vertebral body heights are maintained. Mild multilevel degenerative changes with anterior osteophytosis. Visualized bony pelvis appears intact. IMPRESSION: Negative. Electronically Signed   By: Charline Bills M.D.   On: 03/16/2017 15:07   Dg Pelvis 1-2 Views  Result Date: 02/20/2017 CLINICAL DATA:  Pain following questionable fall EXAM: PELVIS - 1-2 VIEW COMPARISON:  November 17, 2016. FINDINGS: There is no demonstrable fracture or dislocation. There is subtle sclerosis in each femoral neck region which appears stable compared to prior study and is not felt to be indicative of acute impaction type injury. There is mild symmetric narrowing of both hip joints. No erosive change. Sacroiliac joints appear unremarkable. IMPRESSION: No appreciable change from prior study. No acute fracture or  dislocation month [place]. Symmetric narrowing both hip joints, fairly mild. Electronically Signed   By: Bretta Bang III M.D.   On: 02/20/2017 08:16   Ct Head Wo Contrast  Result Date: 03/16/2017 CLINICAL DATA:  Pt arrives via ems from the [place], pt had an unwitnessed fall striking his head on the corner of the wall EXAM: CT HEAD WITHOUT CONTRAST CT CERVICAL SPINE WITHOUT CONTRAST TECHNIQUE: Multidetector CT imaging of the head and cervical spine was performed following the standard protocol without intravenous contrast. Multiplanar CT image reconstructions of the cervical spine were also generated. COMPARISON:  02/20/2017 FINDINGS: CT HEAD FINDINGS Brain: No evidence of acute infarction, hemorrhage, extra-axial collection, ventriculomegaly, or mass effect. Generalized cerebral atrophy. Periventricular white  matter low attenuation likely secondary to microangiopathy. Vascular: Cerebrovascular atherosclerotic calcifications are noted. Skull: Negative for fracture or focal lesion. Sinuses/Orbits: Visualized portions of the orbits are unremarkable. Visualized portions of the paranasal sinuses and mastoid air cells are unremarkable. Other: None. CT CERVICAL SPINE FINDINGS Alignment: Normal. Skull base and vertebrae: No acute fracture. No primary bone lesion or focal pathologic process. Soft tissues and spinal canal: No prevertebral fluid or swelling. No visible canal hematoma. Disc levels: Degenerative disc disease with disc height loss at C3-4, C4-5, C5-6 and C6-7. Broad-based disc osteophyte complexes at C3-4, C4-5, C5-6 and C6-7 with bilateral uncovertebral degenerative changes and foraminal encroachment. Ankylosis of the left C3-4 facet joints. Ankylosis of the right C4-5 facet joints. Upper chest: Lung apices are clear. Other: No fluid collection or hematoma. IMPRESSION: 1. No acute intracranial pathology. 2.  No acute osseous injury of the cervical spine. Electronically Signed   By: Elige Ko   On:  03/16/2017 13:19   Ct Head Wo Contrast  Result Date: 02/20/2017 CLINICAL DATA:  Altered level of consciousness, unexplained. EXAM: CT HEAD WITHOUT CONTRAST TECHNIQUE: Contiguous axial images were obtained from the base of the skull through the vertex without intravenous contrast. COMPARISON:  12/04/2016. FINDINGS: Brain: No evidence for acute infarction, hemorrhage, mass lesion, hydrocephalus, or extra-axial fluid. Generalized atrophy, not unexpected for age. White matter hypoattenuation, suspected small vessel disease, without interval change or acute features. Chronic lacunar infarct, genu internal capsule on the LEFT. Vascular: Calcification of the cavernous internal carotid arteries consistent with cerebrovascular atherosclerotic disease. No signs of intracranial large vessel occlusion. Skull: Normal. Negative for fracture or focal lesion. Sinuses/Orbits: No acute finding. Other: None. IMPRESSION: Atrophy and small vessel disease. No acute intracranial findings. Stable appearance from priors. Electronically Signed   By: Elsie Stain M.D.   On: 02/20/2017 08:05   Ct Cervical Spine Wo Contrast  Result Date: 03/16/2017 CLINICAL DATA:  Pt arrives via ems from the [place], pt had an unwitnessed fall striking his head on the corner of the wall EXAM: CT HEAD WITHOUT CONTRAST CT CERVICAL SPINE WITHOUT CONTRAST TECHNIQUE: Multidetector CT imaging of the head and cervical spine was performed following the standard protocol without intravenous contrast. Multiplanar CT image reconstructions of the cervical spine were also generated. COMPARISON:  02/20/2017 FINDINGS: CT HEAD FINDINGS Brain: No evidence of acute infarction, hemorrhage, extra-axial collection, ventriculomegaly, or mass effect. Generalized cerebral atrophy. Periventricular white matter low attenuation likely secondary to microangiopathy. Vascular: Cerebrovascular atherosclerotic calcifications are noted. Skull: Negative for fracture or focal lesion.  Sinuses/Orbits: Visualized portions of the orbits are unremarkable. Visualized portions of the paranasal sinuses and mastoid air cells are unremarkable. Other: None. CT CERVICAL SPINE FINDINGS Alignment: Normal. Skull base and vertebrae: No acute fracture. No primary bone lesion or focal pathologic process. Soft tissues and spinal canal: No prevertebral fluid or swelling. No visible canal hematoma. Disc levels: Degenerative disc disease with disc height loss at C3-4, C4-5, C5-6 and C6-7. Broad-based disc osteophyte complexes at C3-4, C4-5, C5-6 and C6-7 with bilateral uncovertebral degenerative changes and foraminal encroachment. Ankylosis of the left C3-4 facet joints. Ankylosis of the right C4-5 facet joints. Upper chest: Lung apices are clear. Other: No fluid collection or hematoma. IMPRESSION: 1. No acute intracranial pathology. 2.  No acute osseous injury of the cervical spine. Electronically Signed   By: Elige Ko   On: 03/16/2017 13:19   Dg Hip Unilat W Or Wo Pelvis 2-3 Views Left  Result Date: 03/16/2017 CLINICAL DATA:  pt  had an unwitnessed fall striking his head on the corner of the wall, Pt unable to move either leg/hip, appears to be "frozen." unable to flex either leg, or raise either leg. Best images possible. Pt was in no pain EXAM: DG HIP (WITH OR WITHOUT PELVIS) 2-3V LEFT COMPARISON:  02/20/2017 and previous FINDINGS: There is no evidence of hip fracture or dislocation. Early marginal spurring in both hips. No other arthropathy or focal bone abnormality. Bilateral pelvic phleboliths. IMPRESSION: 1. No acute findings. 2. Early bilateral hip degenerative spurring. Electronically Signed   By: Corlis Leak  Hassell M.D.   On: 03/16/2017 13:45   Dg Hip Unilat W Or Wo Pelvis 2-3 Views Right  Result Date: 03/16/2017 CLINICAL DATA:  pt had an unwitnessed fall striking his head on the corner of the wall, Pt unable to move either leg/hip, appears to be "frozen." unable to flex either leg, or raise either leg.  Best images possible. EXAM: DG HIP (WITH OR WITHOUT PELVIS) 2-3V RIGHT COMPARISON:  02/20/2017 FINDINGS: There is no evidence of hip fracture or dislocation. Early marginal spurring in both hips. There is no evidence of arthropathy or other focal bone abnormality. Bilateral pelvic phleboliths. IMPRESSION: 1. No acute findings. 2. Early bilateral hip degenerative spurring. Electronically Signed   By: Corlis Leak  Hassell M.D.   On: 03/16/2017 13:31

## 2017-03-17 NOTE — ED Triage Notes (Signed)
Pt arrives to ED via [facility] from [facility] with c/o "not feeling well". Per EMS, pt seen and treated today for fall and d/c'd back to facility around 6pm today. EMS reports called out to faciloity tonight for pt c/o's weakness but no c/o CP, SHOB; no fever, no N/V/D.

## 2017-03-17 NOTE — ED Provider Notes (Signed)
[facility] Emergency Department Provider Note  ____________________________________________   First MD Initiated Contact with Patient 03/17/17 [phone]     (approximate)  I have reviewed the triage vital signs and the nursing notes.   HISTORY  Chief Complaint Fatigue  Level 5 exemption history limited by the patient's dementia  HPI Jesus Holland is a 84 y.o. male who comes to the emergency department via EMS from his nursing home because "I do not feel good".  He was seen in our emergency department and discharged about 6 hours ago after he had a fall.  He had CT scans, blood work, urinalysis performed which were all unremarkable and he was discharged back home.  Apparently after getting back home he said he did not feel well so staff called 911.  Past Medical History:  Diagnosis Date  . Arthritis   . Diabetes mellitus without complication [facility])     Patient Active Problem List   Diagnosis Date Noted  . Murmur, cardiac 02/26/2017  . Aortic valve disease 02/26/2017  . Chest pain with moderate risk for cardiac etiology 02/23/2017  . Psychosis (HCC) 10/29/2016  . Hyperglycemia 10/23/2016  . Dementia, Alzheimer's, with behavior disturbance 10/23/2016    Past Surgical History:  Procedure Laterality Date  . none      Prior to Admission medications   Medication Sig Start Date End Date Taking? Authorizing Provider  acetaminophen (TYLENOL) 325 MG tablet Take 2 tablets (650 mg total) by mouth every 6 (six) hours as needed. 11/17/16   Sharman Cheek, MD  amLODipine (NORVASC) 10 MG tablet Take 10 mg by mouth daily.    [provider]  Cyanocobalamin (VITAMIN B12) 1000 MCG TBCR Take 1 tablet by mouth daily.    [provider]  docusate sodium (COLACE) 100 MG capsule Take 100 mg by mouth daily.    [provider]  insulin glargine (LANTUS) 100 UNIT/ML injection Inject 0.3 mLs (30 Units total) into the skin daily. Patient taking  differently: Inject 30 Units into the skin at bedtime.  11/16/16   Katha Hamming, MD  LORazepam (ATIVAN) 0.5 MG tablet Take 1 tablet (0.5 mg total) by mouth every 8 (eight) hours as needed for anxiety. Patient not taking: Reported on 03/16/2017 11/12/16   Katha Hamming, MD  losartan (COZAAR) 100 MG tablet Take 1 tablet (100 mg total) by mouth daily. 11/13/16   Katha Hamming, MD  risperiDONE (RISPERDAL M-TABS) 2 MG disintegrating tablet Take 1 tablet (2 mg total) by mouth 2 (two) times daily. 11/15/16   Clapacs, Jackquline Denmark, MD    Allergies Aspirin; Escitalopram; Metformin; and Quinine  Family History  Family history unknown: Yes    Social History Social History   Tobacco Use  . Smoking status: Never Smoker  . Smokeless tobacco: Never Used  Substance Use Topics  . Alcohol use: No  . Drug use: No    Review of Systems Level 5 exemption history limited by the patient's dementia  ____________________________________________   PHYSICAL EXAM:  VITAL SIGNS: ED Triage Vitals [03/17/17 0050]  Enc Vitals Group     BP (!) 141/83     Pulse Rate 95     Resp 17     Temp 98.4 F (36.9 C)     Temp Source Oral     SpO2 96 %     Weight 164 lb (74.4 kg)     Height 5\' 10"  (1.778 m)     Head Circumference      Peak  Flow      Pain Score      Pain Loc      Pain Edu?      Excl. in GC?     Constitutional: Significant dementia.  No acute distress.  Pleasant Eyes: PERRL EOMI. mild discharge from his eyes.  Pupils mid range and brisk Head: Steri-Strips to right nose. Nose: No congestion/rhinnorhea. Mouth/Throat: No trismus Neck: No stridor.   Cardiovascular: Normal rate, regular rhythm. Grossly normal heart sounds.  Good peripheral circulation. Respiratory: Normal respiratory effort.  No retractions. Lungs CTAB and moving good air Gastrointestinal: Soft nontender Musculoskeletal: No lower extremity edema   Neurologic: No gross focal neurologic deficits are  appreciated. Skin:  Skin is warm, dry and intact. No rash noted. Psychiatric: Significant dementia   ____________________________________________   DIFFERENTIAL includes but not limited to  Intracerebral hemorrhage, urinary tract infection, acute coronary syndrome, dehydration, concussion ____________________________________________   LABS (all labs ordered are listed, but only abnormal results are displayed)  Labs Reviewed  COMPREHENSIVE METABOLIC PANEL - Abnormal; Notable for the following components:      Result Value   Potassium 3.4 (*)    Glucose, Bld 120 (*)    Calcium 8.5 (*)    Total Protein 6.4 (*)    Albumin 3.1 (*)    ALT 14 (*)    All other components within normal limits  CBC WITH DIFFERENTIAL/PLATELET - Abnormal; Notable for the following components:   RBC 4.00 (*)    Hemoglobin 12.7 (*)    HCT 35.9 (*)    Neutro Abs 7.8 (*)    Lymphs Abs 0.8 (*)    All other components within normal limits  URINALYSIS, COMPLETE (UACMP) WITH MICROSCOPIC - Abnormal; Notable for the following components:   Color, Urine YELLOW (*)    APPearance CLEAR (*)    Ketones, ur 20 (*)    Squamous Epithelial / LPF 0-5 (*)    All other components within normal limits  TROPONIN I    Lab work reviewed by me with no acute disease noted __________________________________________  EKG  ED ECG REPORT I, Merrily Brittle, the attending physician, personally viewed and interpreted this ECG.  Date: 03/17/2017 EKG Time:  Rate: 92 Rhythm: normal sinus rhythm QRS Axis: normal Intervals: normal ST/T Wave abnormalities: normal Narrative Interpretation: no evidence of acute ischemia  ____________________________________________  RADIOLOGY  Chest x-ray reviewed by me with no acute disease ____________________________________________   PROCEDURES  Procedure(s) performed: no  Procedures  Critical Care performed: no  Observation:  no ____________________________________________   INITIAL IMPRESSION / ASSESSMENT AND PLAN / ED COURSE  Pertinent labs & imaging results that were available during my care of the patient were reviewed by me and considered in my medical decision making (see chart for details).  History is challenging to obtain secondary to the patient's dementia.  He has no focal findings.  EKG urinalysis blood work and chest x-ray are unremarkable.  He very well may have some component of pain after his fall earlier today.  He does feel improved after morphine.  Regardless at this point there is no indication for inpatient admission.  Will be discharged home with primary care follow-up.  Guardian will be notified.      ____________________________________________   FINAL CLINICAL IMPRESSION(S) / ED DIAGNOSES  Final diagnoses:  Malaise      NEW MEDICATIONS STARTED DURING THIS VISIT:  New Prescriptions   No medications on file     Note:  This document was prepared using  Dragon voice recognition software aChemical engineernd may include unintentional dictation errors.     Merrily Brittleifenbark, Eleonora Peeler, MD 03/17/17 [phone]

## 2017-03-18 ENCOUNTER — Inpatient Hospital Stay
Admission: EM | Admit: 2017-03-18 | Discharge: 2017-03-21 | DRG: 872 | Disposition: A | Discharge status: Hospice | Payer: Medicare Other | Attending: Internal Medicine | Admitting: Internal Medicine

## 2017-03-18 ENCOUNTER — Emergency Department: Payer: Medicare Other

## 2017-03-18 ENCOUNTER — Encounter: Payer: Self-pay | Admitting: Intensive Care

## 2017-03-18 DIAGNOSIS — Z794 Long term (current) use of insulin: Secondary | ICD-10-CM | POA: Diagnosis not present

## 2017-03-18 DIAGNOSIS — I110 Hypertensive heart disease with heart failure: Secondary | ICD-10-CM | POA: Diagnosis present

## 2017-03-18 DIAGNOSIS — R1310 Dysphagia, unspecified: Secondary | ICD-10-CM | POA: Diagnosis present

## 2017-03-18 DIAGNOSIS — I5032 Chronic diastolic (congestive) heart failure: Secondary | ICD-10-CM | POA: Diagnosis present

## 2017-03-18 DIAGNOSIS — G9340 Encephalopathy, unspecified: Secondary | ICD-10-CM | POA: Diagnosis present

## 2017-03-18 DIAGNOSIS — R627 Adult failure to thrive: Secondary | ICD-10-CM | POA: Diagnosis present

## 2017-03-18 DIAGNOSIS — R4182 Altered mental status, unspecified: Secondary | ICD-10-CM | POA: Diagnosis not present

## 2017-03-18 DIAGNOSIS — A419 Sepsis, unspecified organism: Principal | ICD-10-CM | POA: Diagnosis present

## 2017-03-18 DIAGNOSIS — Z515 Encounter for palliative care: Secondary | ICD-10-CM | POA: Diagnosis present

## 2017-03-18 DIAGNOSIS — R6510 Systemic inflammatory response syndrome (SIRS) of non-infectious origin without acute organ dysfunction: Secondary | ICD-10-CM | POA: Diagnosis present

## 2017-03-18 DIAGNOSIS — E119 Type 2 diabetes mellitus without complications: Secondary | ICD-10-CM | POA: Diagnosis present

## 2017-03-18 DIAGNOSIS — Z79899 Other long term (current) drug therapy: Secondary | ICD-10-CM | POA: Diagnosis not present

## 2017-03-18 DIAGNOSIS — G309 Alzheimer's disease, unspecified: Secondary | ICD-10-CM | POA: Diagnosis present

## 2017-03-18 DIAGNOSIS — Z66 Do not resuscitate: Secondary | ICD-10-CM | POA: Diagnosis present

## 2017-03-18 DIAGNOSIS — F0280 Dementia in other diseases classified elsewhere without behavioral disturbance: Secondary | ICD-10-CM | POA: Diagnosis present

## 2017-03-18 HISTORY — DX: Unspecified dementia, unspecified severity, without behavioral disturbance, psychotic disturbance, mood disturbance, and anxiety: F03.90

## 2017-03-18 HISTORY — DX: Heart failure, unspecified: I50.9

## 2017-03-18 HISTORY — DX: Essential (primary) hypertension: I10

## 2017-03-18 LAB — TROPONIN I: Troponin I: <0.03 ng/mL (ref <0.03)

## 2017-03-18 LAB — COMPREHENSIVE METABOLIC PANEL
ALT: 13 U/L — ABNORMAL LOW (ref 17–63)
AST: 14 U/L — ABNORMAL LOW (ref 15–41)
Albumin: 3.1 g/dL — ABNORMAL LOW (ref 3.5–5.0)
Alkaline Phosphatase: 84 U/L (ref 38–126)
Anion gap: 16 — ABNORMAL HIGH (ref 5–15)
BUN: 31 mg/dL — AB (ref 6–20)
CO2: 15 mmol/L — ABNORMAL LOW (ref 22–32)
CREATININE: 1.1 mg/dL (ref 0.61–1.24)
Calcium: 8.6 mg/dL — ABNORMAL LOW (ref 8.9–10.3)
Chloride: 109 mmol/L (ref 101–111)
GFR calc Af Amer: >60 mL/min (ref >60)
GFR, EST NON AFRICAN AMERICAN: 59 mL/min — AB (ref >60)
GLUCOSE: 223 mg/dL — AB (ref 65–99)
POTASSIUM: 3.9 mmol/L (ref 3.5–5.1)
SODIUM: 140 mmol/L (ref 135–145)
TOTAL PROTEIN: 6.6 g/dL (ref 6.5–8.1)
Total Bilirubin: 1.4 mg/dL — ABNORMAL HIGH (ref 0.3–1.2)

## 2017-03-18 LAB — URINALYSIS, COMPLETE (UACMP) WITH MICROSCOPIC
BACTERIA UA: NONE SEEN
Bilirubin Urine: NEGATIVE
Glucose, UA: NEGATIVE mg/dL
Hgb urine dipstick: NEGATIVE
KETONES UR: 80 mg/dL — AB
Leukocytes, UA: NEGATIVE
Nitrite: NEGATIVE
Protein, ur: 30 mg/dL — AB
SPECIFIC GRAVITY, URINE: 1.023 (ref 1.005–1.030)
SQUAMOUS EPITHELIAL / LPF: NONE SEEN
pH: 5 (ref 5.0–8.0)

## 2017-03-18 LAB — CBC WITH DIFFERENTIAL/PLATELET
Basophils Absolute: 0 10*3/uL (ref 0–0.1)
Basophils Relative: 0 %
EOS ABS: 0 10*3/uL (ref 0–0.7)
EOS PCT: 0 %
HCT: 37.6 % — ABNORMAL LOW (ref 40.0–52.0)
Hemoglobin: 12.9 g/dL — ABNORMAL LOW (ref 13.0–18.0)
LYMPHS ABS: 0.6 10*3/uL — AB (ref 1.0–3.6)
Lymphocytes Relative: 4 %
MCH: 31.5 pg (ref 26.0–34.0)
MCHC: 34.2 g/dL (ref 32.0–36.0)
MCV: 92.1 fL (ref 80.0–100.0)
MONOS PCT: 6 %
Monocytes Absolute: 0.8 10*3/uL (ref 0.2–1.0)
Neutro Abs: 14 10*3/uL — ABNORMAL HIGH (ref 1.4–6.5)
Neutrophils Relative %: 90 %
PLATELETS: 353 10*3/uL (ref 150–440)
RBC: 4.08 MIL/uL — ABNORMAL LOW (ref 4.40–5.90)
RDW: 13.8 % (ref 11.5–14.5)
WBC: 15.5 10*3/uL — ABNORMAL HIGH (ref 3.8–10.6)

## 2017-03-18 LAB — GLUCOSE, CAPILLARY
GLUCOSE-CAPILLARY: 261 mg/dL — AB (ref 65–99)
Glucose-Capillary: 152 mg/dL — ABNORMAL HIGH (ref 65–99)

## 2017-03-18 LAB — LACTIC ACID, PLASMA: Lactic Acid, Venous: 1.1 mmol/L (ref 0.5–1.9)

## 2017-03-18 LAB — INFLUENZA PANEL BY PCR (TYPE A & B)
INFLAPCR: NEGATIVE
INFLBPCR: NEGATIVE

## 2017-03-18 MED ORDER — ONDANSETRON HCL 4 MG PO TABS: 4.0000 mg | ORAL_TABLET | Freq: Four times a day (QID) | ORAL | Status: DC | PRN

## 2017-03-18 MED ORDER — BISACODYL 5 MG PO TBEC: 5.0000 mg | DELAYED_RELEASE_TABLET | Freq: Every day | ORAL | Status: DC | PRN

## 2017-03-18 MED ORDER — SODIUM CHLORIDE 0.9 % IV SOLN
Freq: Once | INTRAVENOUS | Status: AC
  Administered 2017-03-18: 11:00:00 via INTRAVENOUS

## 2017-03-18 MED ORDER — LOSARTAN POTASSIUM 50 MG PO TABS
100.0000 mg | ORAL_TABLET | Freq: Every day | ORAL | Status: DC
  Filled 2017-03-18: qty 2

## 2017-03-18 MED ORDER — LORAZEPAM 0.5 MG PO TABS
0.5000 mg | ORAL_TABLET | Freq: Two times a day (BID) | ORAL | Status: DC
  Filled 2017-03-18: qty 1

## 2017-03-18 MED ORDER — ORAL CARE MOUTH RINSE
15.0000 mL | Freq: Two times a day (BID) | OROMUCOSAL | Status: DC
  Administered 2017-03-18 – 2017-03-21 (×5): 15 mL via OROMUCOSAL

## 2017-03-18 MED ORDER — CHLORHEXIDINE GLUCONATE 0.12 % MT SOLN
15.0000 mL | Freq: Two times a day (BID) | OROMUCOSAL | Status: DC
  Administered 2017-03-18 – 2017-03-21 (×4): 15 mL via OROMUCOSAL
  Filled 2017-03-18 (×4): qty 15

## 2017-03-18 MED ORDER — INSULIN GLARGINE 100 UNIT/ML ~~LOC~~ SOLN
30.0000 [IU] | Freq: Every day | SUBCUTANEOUS | Status: DC
  Administered 2017-03-18 – 2017-03-19 (×2): 30 [IU] via SUBCUTANEOUS
  Filled 2017-03-18 (×3): qty 0.3

## 2017-03-18 MED ORDER — RISPERIDONE 1 MG PO TBDP
2.0000 mg | ORAL_TABLET | Freq: Two times a day (BID) | ORAL | Status: DC
  Administered 2017-03-21: 11:00:00 2 mg via ORAL
  Filled 2017-03-18 (×8): qty 2

## 2017-03-18 MED ORDER — VANCOMYCIN HCL IN DEXTROSE 1-5 GM/200ML-% IV SOLN
1000.0000 mg | Freq: Once | INTRAVENOUS | Status: AC
  Administered 2017-03-18: 1000 mg via INTRAVENOUS
  Filled 2017-03-18: qty 200

## 2017-03-18 MED ORDER — ENOXAPARIN SODIUM 40 MG/0.4ML ~~LOC~~ SOLN
40.0000 mg | SUBCUTANEOUS | Status: DC
  Administered 2017-03-18 – 2017-03-19 (×2): 40 mg via SUBCUTANEOUS
  Filled 2017-03-18 (×2): qty 0.4

## 2017-03-18 MED ORDER — ONDANSETRON HCL 4 MG/2ML IJ SOLN: 4.0000 mg | Freq: Four times a day (QID) | INTRAMUSCULAR | Status: DC | PRN

## 2017-03-18 MED ORDER — ACETAMINOPHEN 325 MG PO TABS: 650.0000 mg | ORAL_TABLET | Freq: Four times a day (QID) | ORAL | Status: DC | PRN

## 2017-03-18 MED ORDER — INSULIN ASPART 100 UNIT/ML ~~LOC~~ SOLN
0.0000 [IU] | Freq: Three times a day (TID) | SUBCUTANEOUS | Status: DC
  Administered 2017-03-18: 17:00:00 5 [IU] via SUBCUTANEOUS
  Filled 2017-03-18: qty 1

## 2017-03-18 MED ORDER — VITAMIN B-12 1000 MCG PO TABS: 1000.0000 ug | ORAL_TABLET | Freq: Every day | ORAL | Status: DC

## 2017-03-18 MED ORDER — POLYETHYLENE GLYCOL 3350 17 G PO PACK: 17.0000 g | PACK | Freq: Every day | ORAL | Status: DC | PRN

## 2017-03-18 MED ORDER — PIPERACILLIN-TAZOBACTAM 3.375 G IVPB
3.3750 g | Freq: Three times a day (TID) | INTRAVENOUS | Status: DC
  Administered 2017-03-18 – 2017-03-20 (×5): 3.375 g via INTRAVENOUS
  Filled 2017-03-18 (×5): qty 50

## 2017-03-18 MED ORDER — SODIUM CHLORIDE 0.9 % IV SOLN
INTRAVENOUS | Status: DC
  Administered 2017-03-18 – 2017-03-19 (×2): via INTRAVENOUS

## 2017-03-18 MED ORDER — DOCUSATE SODIUM 100 MG PO CAPS
100.0000 mg | ORAL_CAPSULE | Freq: Every day | ORAL | Status: DC
  Filled 2017-03-18: qty 1

## 2017-03-18 MED ORDER — AMLODIPINE BESYLATE 10 MG PO TABS
10.0000 mg | ORAL_TABLET | Freq: Every day | ORAL | Status: DC
  Filled 2017-03-18: qty 1

## 2017-03-18 MED ORDER — PIPERACILLIN-TAZOBACTAM 3.375 G IVPB 30 MIN
3.3750 g | Freq: Once | INTRAVENOUS | Status: AC
  Administered 2017-03-18: 3.375 g via INTRAVENOUS
  Filled 2017-03-18: qty 50

## 2017-03-18 MED ORDER — TRAMADOL HCL 50 MG PO TABS: 50.0000 mg | ORAL_TABLET | Freq: Four times a day (QID) | ORAL | Status: DC | PRN

## 2017-03-18 NOTE — ED Notes (Signed)
Pt taken to floor in hospital bed. Report given at bedside. All questions answered. VSS. NAD.

## 2017-03-18 NOTE — ED Provider Notes (Signed)
[facility] Emergency Department Provider Note       Time seen: ----------------------------------------- 9:47 AM on 03/18/2017 ----------------------------------------- Level V caveat: History/ROS limited by altered mental status  I have reviewed the triage vital signs and the nursing notes.  HISTORY   Chief Complaint Altered Mental Status    HPI Jesus Holland is a 84 y.o. male with a history of arthritis, CHF, dementia, diabetes, hypertension who presents to the ED for decreased level of consciousness.  Patient reportedly fell Sunday morning hitting his head on the wall and has been seen twice since then here at the hospital.  Now he is currently having a fever up to 101 axillary.  He does have a history of dementia, he has hypertension and diabetes.  No further information is available.  Past Medical History:  Diagnosis Date  . Arthritis   . CHF (congestive heart failure) (HCC)   . Dementia   . Diabetes mellitus without complication (HCC)   . Hypertension     Patient Active Problem List   Diagnosis Date Noted  . Murmur, cardiac 02/26/2017  . Aortic valve disease 02/26/2017  . Chest pain with moderate risk for cardiac etiology 02/23/2017  . Psychosis (HCC) 10/29/2016  . Hyperglycemia 10/23/2016  . Dementia, Alzheimer's, with behavior disturbance 10/23/2016    Past Surgical History:  Procedure Laterality Date  . none      Allergies Aspirin; Escitalopram; Metformin; and Quinine  Social History Social History   Tobacco Use  . Smoking status: Never Smoker  . Smokeless tobacco: Never Used  Substance Use Topics  . Alcohol use: No  . Drug use: No    Review of Systems Positive for altered mental status and fever, otherwise unknown.  All systems negative/normal/unremarkable/unknown except as stated in the HPI  ____________________________________________   PHYSICAL EXAM:  VITAL SIGNS: ED Triage Vitals  Enc Vitals Group     BP  03/18/17 0937 120/69     Pulse Rate 03/18/17 0937 (!) 104     Resp 03/18/17 0937 15     Temp 03/18/17 0937 99.7 F (37.6 C)     Temp Source 03/18/17 0937 Oral     SpO2 03/18/17 0934 93 %     Weight 03/18/17 0938 164 lb (74.4 kg)     Height 03/18/17 0938 5\' 10"  (1.778 m)     Head Circumference --      Peak Flow --      Pain Score --      Pain Loc --      Pain Edu? --      Excl. in GC? --     Constitutional: Alert disoriented.  Lethargic, no distress  Eyes: Conjunctivae are normal. Normal extraocular movements. ENT   Head: Normocephalic and atraumatic.   Nose: No congestion/rhinnorhea.   Mouth/Throat: Mucous membranes are moist.  There is food from breakfast still in his mouth.   Neck: No stridor. Cardiovascular: Normal rate, regular rhythm. No murmurs, rubs, or gallops. Respiratory: Normal respiratory effort without tachypnea nor retractions. Breath sounds are clear and equal bilaterally. No wheezes/rales/rhonchi. Gastrointestinal: Soft and nontender. Normal bowel sounds Musculoskeletal: Nontender with normal range of motion in extremities. No lower extremity tenderness nor edema. Neurologic: Patient is nonverbal at this time, profound weakness is noted. Skin:  Skin is warm, dry and intact. No rash noted. Psychiatric: Flat affect ____________________________________________  EKG: Interpreted by me.  Sinus tachycardia with a rate of 107 bpm, normal PR interval, normal QRS, normal QT.  Possible inferior  infarct age-indeterminate  ____________________________________________  ED COURSE:  As part of my medical decision making, I reviewed the following data within the electronic MEDICAL RECORD NUMBER History obtained from family if available, nursing notes, old chart and ekg, as well as notes from prior ED visits. Patient presented for altered mental status and was found to be febrile, we will assess with labs and imaging as indicated at this time.    Procedures ____________________________________________   LABS (pertinent positives/negatives)  Labs Reviewed  CBC WITH DIFFERENTIAL/PLATELET - Abnormal; Notable for the following components:      Result Value   WBC 15.5 (*)    RBC 4.08 (*)    Hemoglobin 12.9 (*)    HCT 37.6 (*)    Neutro Abs 14.0 (*)    Lymphs Abs 0.6 (*)    All other components within normal limits  COMPREHENSIVE METABOLIC PANEL - Abnormal; Notable for the following components:   CO2 15 (*)    Glucose, Bld 223 (*)    BUN 31 (*)    Calcium 8.6 (*)    Albumin 3.1 (*)    AST 14 (*)    ALT 13 (*)    Total Bilirubin 1.4 (*)    GFR calc non Af Amer 59 (*)    Anion gap 16 (*)    All other components within normal limits  URINALYSIS, COMPLETE (UACMP) WITH MICROSCOPIC - Abnormal; Notable for the following components:   Color, Urine YELLOW (*)    APPearance HAZY (*)    Ketones, ur 80 (*)    Protein, ur 30 (*)    All other components within normal limits  CULTURE, BLOOD (ROUTINE X 2)  CULTURE, BLOOD (ROUTINE X 2)  TROPONIN I  INFLUENZA PANEL BY PCR (TYPE A & B)  CBG MONITORING, ED    RADIOLOGY  Chest x-ray IMPRESSION: No acute disease. ____________________________________________  DIFFERENTIAL DIAGNOSIS   Sepsis, influenza, URI, pneumonia, dehydration, electrolyte abnormality  FINAL ASSESSMENT AND PLAN  Altered mental status   Plan: Patient had presented for altered mental status. Patient's labs did reveal leukocytosis with no obvious etiology. Patient's imaging is negative for acute process.  Patient has signs of systemic inflammatory response.  I will initiate broad-spectrum antibiotics and discussed with the hospitalist for observation.   Ulice DashJohnathan E Azion Centrella, MD   Note: This note was generated in part or whole with voice recognition software. Voice recognition is usually quite accurate but there are transcription errors that can and very often do occur. I apologize for any typographical  errors that were not detected and corrected.     Emily FilbertWilliams, Nicandro Perrault E, MD 03/18/17 [phone]

## 2017-03-18 NOTE — Consult Note (Signed)
Pharmacy Antibiotic Note  Jesus Holland is a 84 y.o. male admitted on 03/18/2017 with sepsis.  Pharmacy has been consulted for Zosyn dosing. Patient received 1 dose vancomycin 1g IV and Zosyn 3.375 IV in ED.   Plan: Start Zosyn 3.375 IV EI every 8 hours.  Height: 5\' 10"  (177.8 cm) Weight: 164 lb (74.4 kg) IBW/kg (Calculated) : 73  Temp (24hrs), Avg:99.7 F (37.6 C), Min:99.7 F (37.6 C), Max:99.7 F (37.6 C)  Recent Labs  Lab 03/16/17 1532 03/17/17 0059 03/18/17 0950  WBC 9.4 9.4 15.5*  CREATININE 0.66 0.69 1.10    Estimated Creatinine Clearance: 49.8 mL/min (by C-G formula based on SCr of 1.1 mg/dL).    Allergies  Allergen Reactions  . Aspirin Nausea And Vomiting  . Escitalopram Other (See Comments)    unknown  . Metformin Other (See Comments)    unknown  . Quinine Nausea And Vomiting    Antimicrobials this admission: 2/4 vancomycin >> x1 dose 2/4 Zosyn  >>   Dose adjustments this admission:  Microbiology results: 2/4  BCx: pending  Thank you for allowing pharmacy to be a part of this patient's care.  Gardner CandleSheema M Melody Savidge, PharmD, BCPS Clinical Pharmacist 03/18/2017 2:12 PM

## 2017-03-18 NOTE — ED Triage Notes (Signed)
From [facility] by EMS for decreased LOC. Patient fell Satjurday morning, hitting head on wall and has been [facility]. Patient now running fevers. EMS 101.1ax, 120/72 b/p, RA 93%-pt put on 3L O2, shallow breathing, blood sugar 220. HX dementia, HTN, diabetic

## 2017-03-18 NOTE — H&P (Signed)
[facility] [place] at [facility]   PATIENT NAME: Jesus Holland    MR#:  [id number]  DATE OF BIRTH:  [birth year]  DATE OF ADMISSION:  03/18/2017  PRIMARY CARE PHYSICIAN: Marguarite Arbour, MD   REQUESTING/REFERRING PHYSICIAN: dr Mayford Knife  CHIEF COMPLAINT:   AMS HISTORY OF PRESENT ILLNESS:  Jesus Holland  is a 84 y.o. male with a known history of dementia, chronic diasolic heart failure and Diabetes who presents from the [place] with fevers and altered mental status. Patient was seen in the emergency room on February 2 after having a non-syncopal fall. At that time head CT and further work up was negative. He presented again yesterday to the emergency room due to complaints of not feeling well. Chest x-ray CBC and BMP were within normal limits along with UA and so patient was sent back to facility. Patient presents today as mentioned due to fevers. EMS reports temperature of 101.1 and 93% room air. His blood sugar was 220. Patient has history of dementia. According to the ER physician who is seen this patient several times in the past it appears the patient's dementia may be progressing.  PAST MEDICAL HISTORY:   Past Medical History:  Diagnosis Date  . Arthritis   . CHF (congestive heart failure) (HCC)   . Dementia   . Diabetes mellitus without complication (HCC)   . Hypertension     PAST SURGICAL HISTORY:   Past Surgical History:  Procedure Laterality Date  . none      SOCIAL HISTORY:   Social History   Tobacco Use  . Smoking status: Never Smoker  . Smokeless tobacco: Never Used  Substance Use Topics  . Alcohol use: No    FAMILY HISTORY:   Family History  Family history unknown: Yes    DRUG ALLERGIES:   Allergies  Allergen Reactions  . Aspirin Nausea And Vomiting  . Escitalopram Other (See Comments)    unknown  . Metformin Other (See Comments)    unknown  . Quinine Nausea And Vomiting    REVIEW OF SYSTEMS:   Review of Systems  Unable  to perform ROS: Dementia    MEDICATIONS AT HOME:   Prior to Admission medications   Medication Sig Start Date End Date Taking? Authorizing Provider  acetaminophen (TYLENOL) 325 MG tablet Take 2 tablets (650 mg total) by mouth every 6 (six) hours as needed. 11/17/16  Yes Sharman Cheek, MD  amLODipine (NORVASC) 10 MG tablet Take 10 mg by mouth daily.   Yes [provider]  Cyanocobalamin (VITAMIN B12) 1000 MCG TBCR Take 1 tablet by mouth daily.   Yes [provider]  docusate sodium (COLACE) 100 MG capsule Take 100 mg by mouth daily.   Yes [provider]  insulin glargine (LANTUS) 100 UNIT/ML injection Inject 0.3 mLs (30 Units total) into the skin daily. Patient taking differently: Inject 30 Units into the skin at bedtime.  11/16/16  Yes Katha Hamming, MD  LORazepam (ATIVAN) 0.5 MG tablet Take 1 tablet (0.5 mg total) by mouth every 8 (eight) hours as needed for anxiety. Patient taking differently: Take 0.5 mg by mouth 2 (two) times daily.  11/12/16  Yes Katha Hamming, MD  losartan (COZAAR) 100 MG tablet Take 1 tablet (100 mg total) by mouth daily. 11/13/16  Yes Katha Hamming, MD  risperiDONE (RISPERDAL M-TABS) 2 MG disintegrating tablet Take 1 tablet (2 mg total) by mouth 2 (two) times daily. 11/15/16  Yes Clapacs, Jackquline Denmark, MD  VITAL SIGNS:  Blood pressure (!) 141/64, pulse (!) 110, temperature 99.7 F (37.6 C), temperature source Oral, resp. rate 20, height 5\' 10"  (1.778 m), weight 74.4 kg (164 lb), SpO2 94 %.  PHYSICAL EXAMINATION:   Physical Exam  Constitutional: He is well-developed, well-nourished, and in no distress. No distress.  Patient opens eyes but is not verbal at this time Does not follow any commands at this time  HENT:  Head: Normocephalic.  Eyes: No scleral icterus.  Neck: Neck supple. No JVD present. No tracheal deviation present. No thyromegaly present.  Cardiovascular: Normal rate, regular rhythm and normal heart  sounds. Exam reveals no gallop and no friction rub.  No murmur heard. Pulmonary/Chest: Effort normal and breath sounds normal. No respiratory distress. He has no wheezes. He has no rales. He exhibits no tenderness.  Abdominal: Soft. Bowel sounds are normal. He exhibits no distension and no mass. There is no tenderness. There is no rebound and no guarding.  Musculoskeletal: He exhibits deformity. He exhibits no edema or tenderness.  Neurological: He is alert.  Not following commands  Skin: Skin is warm. No rash noted. He is not diaphoretic. No erythema.  Psychiatric:  Dementia      LABORATORY PANEL:   CBC Recent Labs  Lab 03/18/17 0950  WBC 15.5*  HGB 12.9*  HCT 37.6*  PLT 353   ------------------------------------------------------------------------------------------------------------------  Chemistries  Recent Labs  Lab 03/18/17 0950  NA 140  K 3.9  CL 109  CO2 15*  GLUCOSE 223*  BUN 31*  CREATININE 1.10  CALCIUM 8.6*  AST 14*  ALT 13*  ALKPHOS 84  BILITOT 1.4*   ------------------------------------------------------------------------------------------------------------------  Cardiac Enzymes Recent Labs  Lab 03/18/17 0950  TROPONINI <0.03   ------------------------------------------------------------------------------------------------------------------  RADIOLOGY:  Dg Chest 1 View  Result Date: 03/18/2017 CLINICAL DATA:  Fever.  The patient suffered a fall 03/16/2017. EXAM: CHEST 1 VIEW COMPARISON:  Single-view of the chest 03/17/2017 and 03/16/2016. FINDINGS: The lungs are clear. Heart size is normal. No pneumothorax or pleural effusion. No acute bony abnormality. IMPRESSION: No acute disease. Electronically Signed   By: Drusilla Kannerhomas  Dalessio M.D.   On: 03/18/2017 10:23   Dg Chest 1 View  Result Date: 03/17/2017 CLINICAL DATA:  Acute onset weakness and shortness of breath. EXAM: CHEST 1 VIEW COMPARISON:  03/16/2017 FINDINGS: Slightly shallow inspiration.  Heart size and pulmonary vascularity are normal. Lungs are clear. No airspace disease or consolidation. Aortic calcification. No blunting of costophrenic angles. No pneumothorax. Degenerative changes in the spine. IMPRESSION: No evidence of active pulmonary disease.  Aortic atherosclerosis. Electronically Signed   By: Burman NievesWilliam  Stevens M.D.   On: 03/17/2017 01:12   Dg Chest 1 View  Result Date: 03/16/2017 CLINICAL DATA:  Status post fall.  Dementia. EXAM: CHEST 1 VIEW COMPARISON:  02/20/2017 FINDINGS: Enlarged heart. Mediastinal contours appear intact. Calcific atherosclerotic disease of the aorta. There is no evidence of focal airspace consolidation, pleural effusion or pneumothorax. Osseous structures are without acute abnormality. Soft tissues are grossly normal. IMPRESSION: Enlarged heart. Calcific atherosclerotic disease of the aorta. No displaced fractures seen. Electronically Signed   By: Ted Mcalpineobrinka  Dimitrova M.D.   On: 03/16/2017 15:07   Dg Lumbar Spine Complete  Result Date: 03/16/2017 CLINICAL DATA:  Fall, generalized pain EXAM: LUMBAR SPINE - COMPLETE 4+ VIEW COMPARISON:  None. FINDINGS: Five lumbar-type vertebral bodies. Normal lumbar lordosis. No evidence of fracture or dislocation. Vertebral body heights are maintained. Mild multilevel degenerative changes with anterior osteophytosis. Visualized bony pelvis appears intact. IMPRESSION:  Negative. Electronically Signed   By: Charline Bills M.D.   On: 03/16/2017 15:07    EKG:  Sinus tachycardia heart rate of 99 with PACs old Q waves in the anterior leads  IMPRESSION AND PLAN:   84 year old male with moderate to severe dementia who presents from the [place] with fever and altered mental status.  1. Sepsis: Patient presents with fever, leukocytosis and tachycardia Etiology of sepsis is unclear at this time. Chest x-ray, and full in the testing, CBC and UA are within normal limits. Continue empiric Zosyn Follow up final blood cultures  2.  Acute encephalopathy on chronic dementia: I have asked ER physician to repeat CT scan of the head. He had a fall 2 days ago and at that time head CT was normal however due to altered mental status I believe it would be prudent to order head CT. Palliative care consultation requested.  3. Dementia: It appears that this may be progressive I have left message for DSS, Cleophas Dunker who is patient's emergency contact Continue Risperdal 4. Diabetes: Continue Lantus with sliding scale and ADA diet  5. Essential hypertension: Continue Norvasc and losartan   Clinical social worker consultation for discharge planning    All the records are reviewed and case discussed with ED provider. Left message for DSS   CODE STATUS: FULL  TOTAL TIME TAKING CARE OF THIS PATIENT: 42 minutes.    Skyrah Krupp M.D on 03/18/2017 at 1:52 PM  Between 7am to 6pm - Pager - [phone]  After 6pm go to [email or web address] - Social research officer, government  [facility]  Office  [phone]  CC: Primary care physician; Marguarite Arbour, MD

## 2017-03-18 NOTE — Plan of Care (Signed)
Pt admitted from the ED. VSS. Pt lethargic and somnolent. Responds to pain. No signs of pain.

## 2017-03-19 DIAGNOSIS — R4182 Altered mental status, unspecified: Secondary | ICD-10-CM

## 2017-03-19 DIAGNOSIS — A419 Sepsis, unspecified organism: Principal | ICD-10-CM

## 2017-03-19 DIAGNOSIS — Z515 Encounter for palliative care: Secondary | ICD-10-CM

## 2017-03-19 DIAGNOSIS — R1310 Dysphagia, unspecified: Secondary | ICD-10-CM

## 2017-03-19 DIAGNOSIS — Z7189 Other specified counseling: Secondary | ICD-10-CM

## 2017-03-19 DIAGNOSIS — R6510 Systemic inflammatory response syndrome (SIRS) of non-infectious origin without acute organ dysfunction: Secondary | ICD-10-CM

## 2017-03-19 LAB — BASIC METABOLIC PANEL
ANION GAP: 5 (ref 5–15)
BUN: 31 mg/dL — ABNORMAL HIGH (ref 6–20)
CO2: 23 mmol/L (ref 22–32)
Calcium: 8.2 mg/dL — ABNORMAL LOW (ref 8.9–10.3)
Chloride: 115 mmol/L — ABNORMAL HIGH (ref 101–111)
Creatinine, Ser: 0.89 mg/dL (ref 0.61–1.24)
GFR calc non Af Amer: >60 mL/min (ref >60)
Glucose, Bld: 171 mg/dL — ABNORMAL HIGH (ref 65–99)
POTASSIUM: 3.4 mmol/L — AB (ref 3.5–5.1)
Sodium: 143 mmol/L (ref 135–145)

## 2017-03-19 LAB — CBC
HCT: 35.5 % — ABNORMAL LOW (ref 40.0–52.0)
HEMOGLOBIN: 12.2 g/dL — AB (ref 13.0–18.0)
MCH: 31.6 pg (ref 26.0–34.0)
MCHC: 34.4 g/dL (ref 32.0–36.0)
MCV: 91.8 fL (ref 80.0–100.0)
PLATELETS: 305 10*3/uL (ref 150–440)
RBC: 3.87 MIL/uL — AB (ref 4.40–5.90)
RDW: 14 % (ref 11.5–14.5)
WBC: 10 10*3/uL (ref 3.8–10.6)

## 2017-03-19 LAB — GLUCOSE, CAPILLARY
GLUCOSE-CAPILLARY: 139 mg/dL — AB (ref 65–99)
GLUCOSE-CAPILLARY: 104 mg/dL — AB (ref 65–99)
Glucose-Capillary: 145 mg/dL — ABNORMAL HIGH (ref 65–99)
Glucose-Capillary: 115 mg/dL — ABNORMAL HIGH (ref 65–99)

## 2017-03-19 MED ORDER — METOPROLOL TARTRATE 5 MG/5ML IV SOLN: 5.0000 mg | INTRAVENOUS | Status: DC | PRN

## 2017-03-19 NOTE — Progress Notes (Signed)
[facility] [place] at [facility]   PATIENT NAME: Jesus Holland    MR#:  [id number]  DATE OF BIRTH:  11/09/1928  SUBJECTIVE:  CHIEF COMPLAINT: Patient is altered, unable to get any history from him.  Patient has baseline dementia.  REVIEW OF SYSTEMS:  Unobtainable as the patient is encephalopathic.   DRUG ALLERGIES:   Allergies  Allergen Reactions  . Aspirin Nausea And Vomiting  . Escitalopram Other (See Comments)    unknown  . Metformin Other (See Comments)    unknown  . Quinine Nausea And Vomiting    VITALS:  Blood pressure (!) 156/58, pulse 79, temperature 97.7 F (36.5 C), temperature source Oral, resp. rate 16, height 5\' 10"  (1.778 m), weight 68.2 kg (150 lb 6.4 oz), SpO2 94 %.  PHYSICAL EXAMINATION:  GENERAL:  84 y.o.-year-old patient lying in the bed with no acute distress.  EYES: Pupils equal, round, reactive to light and accommodation. No scleral icterus.   HEENT: Head atraumatic, normocephalic. Oropharynx and nasopharynx clear.  NECK:  Supple, no jugular venous distention. No thyroid enlargement, no tenderness.  LUNGS: Normal breath sounds bilaterally, no wheezing, rales,rhonchi or crepitation. No use of accessory muscles of respiration.  CARDIOVASCULAR: S1, S2 normal. No murmurs, rubs, or gallops.  ABDOMEN: Soft, nontender, nondistended. Bowel sounds present.  EXTREMITIES: No pedal edema, cyanosis, or clubbing.  Neurologic patient is altered      LABORATORY PANEL:   CBC Recent Labs  Lab 03/19/17 0718  WBC 10.0  HGB 12.2*  HCT 35.5*  PLT 305   ------------------------------------------------------------------------------------------------------------------  Chemistries  Recent Labs  Lab 03/18/17 0950 03/19/17 0612  NA 140 143  K 3.9 3.4*  CL 109 115*  CO2 15* 23  GLUCOSE 223* 171*  BUN 31* 31*  CREATININE 1.10 0.89  CALCIUM 8.6* 8.2*  AST 14*  --   ALT 13*  --   ALKPHOS 84  --   BILITOT 1.4*  --     ------------------------------------------------------------------------------------------------------------------  Cardiac Enzymes Recent Labs  Lab 03/18/17 0950  TROPONINI <0.03   ------------------------------------------------------------------------------------------------------------------  RADIOLOGY:  Dg Chest 1 View  Result Date: 03/18/2017 CLINICAL DATA:  Fever.  The patient suffered a fall 03/16/2017. EXAM: CHEST 1 VIEW COMPARISON:  Single-view of the chest 03/17/2017 and 03/16/2016. FINDINGS: The lungs are clear. Heart size is normal. No pneumothorax or pleural effusion. No acute bony abnormality. IMPRESSION: No acute disease. Electronically Signed   By: Drusilla Kanner M.D.   On: 03/18/2017 10:23   Ct Head Wo Contrast  Result Date: 03/18/2017 CLINICAL DATA:  Altered level of consciousness. EXAM: CT HEAD WITHOUT CONTRAST TECHNIQUE: Contiguous axial images were obtained from the base of the skull through the vertex without intravenous contrast. COMPARISON:  CT scan of March 16, 2017. FINDINGS: Brain: Mild diffuse cortical atrophy is noted. Mild chronic ischemic white matter disease is noted. No mass effect or midline shift is noted. Ventricular size is within normal limits. There is no evidence of mass lesion, hemorrhage or acute infarction. Vascular: No hyperdense vessel or unexpected calcification. Skull: Normal. Negative for fracture or focal lesion. Sinuses/Orbits: No acute finding. Other: None. IMPRESSION: Mild diffuse cortical atrophy. Mild chronic ischemic white matter disease. No acute intracranial abnormality seen. Electronically Signed   By: Lupita Raider, M.D.   On: 03/18/2017 14:16    EKG:   Orders placed or performed during the hospital encounter of 03/18/17  . EKG 12-Lead  . EKG 12-Lead    ASSESSMENT AND PLAN:  84 year old male with moderate to severe dementia who presents from the [facility] with fever and altered mental status.  1. Sepsis: Patient presents  with fever, leukocytosis and tachycardia Etiology of sepsis is unclear at this time Negative.Chest x-ray CT head is negative  CBC and UA are within normal limits. Continue empiric Zosyn  blood cultures negative so far  2. Acute encephalopathy on chronic dementia and adult failure to thrive Palliative care is following, discussed with patient's legal guardian vanda Maisie Fushomas plan is to reevaluate the patient in a.m. With plans for hospice facility if less than 2 weeks prognosis and return to the patient's previous facility with hospice care if less than 6 months prognosis -Evaluated by speech therapy recommending strict n.p.o. with oral care  3. Dementia: It appears that this may be progressive Continue Risperdal if patient tolerates by mouth.  Currently n.p.o. as patient is at high risk for aspiration  4. Diabetes:  sliding scale and ADA diet  5. Essential hypertension: Continue Norvasc and losartan if patient tolerates by mouth.  Currently patient is n.p.o. as patient is altered extremely poor prognosis       All the records are reviewed and case discussed with Care Management/Social Workerr. Management plans discussed with the patient, family and they are in agreement.  CODE STATUS: DNR, ward of state.  DSS social worker is following  TOTAL TIME TAKING CARE OF THIS PATIENT: 35 minutes.   POSSIBLE D/C IN 1-2 DAYS, DEPENDING ON CLINICAL CONDITION.  Note: This dictation was prepared with Dragon dictation along with smaller phrase technology. Any transcriptional errors that result from this process are unintentional.   Ramonita LabAruna Quanita Barona M.D on 03/19/2017 at 8:36 PM  Between 7am to 6pm - Pager - [phone] After 6pm go to [email or web address] - password [email or web address]  [facility]  Office  [phone]  CC: Primary care physician; Marguarite ArbourSparks, Jeffrey D, MD

## 2017-03-19 NOTE — Clinical Social Work Note (Signed)
Clinical Social Work Assessment  Patient Details  Name: Jesus Holland MRN: [id number] Date of Birth: January 18, 1929  Date of referral:  03/19/17               Reason for consult:  Other (Comment Required)(From [facility] )                Permission sought to share information with:  Oceanographeracility Contact Representative Permission granted to share information::  Yes, Verbal Permission Granted  Name::      [facility]   Agency::     Relationship::     Contact Information:     Housing/Transportation Living arrangements for the past 2 months:  [facility] Source of Information:  Facility, Guardian Patient Interpreter Needed:  None Criminal Activity/Legal Involvement Pertinent to Current Situation/Hospitalization:  No - Comment as needed Significant Relationships:  Other(Comment)(Guardian) Lives with:  Facility Resident Do you feel safe going back to the place where you live?    Need for family participation in patient care:  Yes (Comment)  Care giving concerns:  Patient has been a resident at [facility] since 12/10/16 and has a guardian through [employer] [name] [phone]. [phone].    Social Worker assessment / plan:  Visual merchandiserClinical Social Worker (CSW) received SNF consult and noted that per chart patient is from [facility]. CSW contacted patient's guardian [name] to get additional information. Per [name] [facility] and has been doing well there. Per [name] patient has no family support and may have 1 nephew in [place]. 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 CSW made [name] aware that the palliative team will be calling her to discuss goals of care. Per [name] she prefers for patient to return [facility]. CSW contacted [facility] and spoke to Willow RiverPenny who is the transportation person. Per Molli KnockPenny, Amber resident care coordinator is out of the office now and CSW left a voicemail. Per Boyd KerbsPenny patient is on room  air at baseline and primarily uses a wheel chair. Per Boyd KerbsPenny patient can sometimes transfer himself however he needs staff to transfer the majority of time. CSW will continue to follow and assist as needed.   Employment status:  Disabled (Comment on whether or not currently receiving Disability) Insurance information:  Managed Medicare PT Recommendations:  Not assessed at this time Information / Referral to community resources:  Other (Comment Required)(Patient will return to [facility]. )  Patient/Family's Response to care:  Patient's guardian is expecting a call from palliative are.   Patient/Family's Understanding of and Emotional Response to Diagnosis, Current Treatment, and Prognosis:  Patient's guardian will discuss goals of care with palliative team.   Emotional Assessment Appearance:  Appears stated age Attitude/Demeanor/Rapport:  Unable to Assess Affect (typically observed):  Unable to Assess Orientation:  Oriented to Self Alcohol / Substance use:  Not Applicable Psych involvement (Current and /or in the community):  No (Comment)  Discharge Needs  Concerns to be addressed:  Discharge Planning Concerns Readmission within the last 30 days:  No Current discharge risk:  Chronically ill, Cognitively Impaired Barriers to Discharge:  Continued Medical Work up   [employer], Darleen CrockerBailey M, LCSW 03/19/2017, 9:59 AM

## 2017-03-19 NOTE — Evaluation (Signed)
Clinical/Bedside Swallow Evaluation Patient Details  Name: Jesus Holland MRN: [id number] Date of Birth: [birth year]  Today's Date: 03/19/2017 Time: SLP Start Time (ACUTE ONLY): 1115 SLP Stop Time (ACUTE ONLY): 1200 SLP Time Calculation (min) (ACUTE ONLY): 45 min  Past Medical History:  Past Medical History:  Diagnosis Date  . Arthritis   . CHF (congestive heart failure) (HCC)   . Dementia   . Diabetes mellitus without complication (HCC)   . Hypertension    Past Surgical History:  Past Surgical History:  Procedure Laterality Date  . none     HPI:  Pt is a 84 y.o. male with a known history of dementia, chronic diasolic heart failure and Diabetes who presents from the [place] with fevers and altered mental status. Patient was seen in the emergency room on February 2 after having a non-syncopal fall. At that time head CT and further work up was negative. He presented again yesterday to the emergency room due to complaints of not feeling well. Chest x-ray CBC and BMP were within normal limits along with UA and so patient was sent back to facility. Patient presents today as mentioned due to fevers. EMS reports temperature of 101.1 and 93% room air. His blood sugar was 220. Patient has history of dementia. According to the ER physician who is seen this patient several times in the past it appears the patient's dementia may be progressing. NSG had concern for pt's ability to safely tolerate an oral diet and requested ST services.    Assessment / Plan / Recommendation Clinical Impression  Pt appears to present w/ severe-profound oropharyngeal phase dysphagia w/ no pharyngeal swallow appreciated during po trials assessed today. Pt was awake and alerted to the offering of ice cream, something to drink through facial gestures but was otherwise nonverbal. Pt appeared to respond to presentation of boluses w/ open mouth but once placed anteriorly in the mouth, he exhibited little to no lingual-oral movements  to manipulate boluses for A-P transfer and swallow - no pharyngeal swallow was appreciated w/ boluses attempted. Pt exhibited a lengthy oral phase w/ holding and bolus residue - residue leaked laterally and anteriorly the longer he held the boluses. OM movements were few. Oral cavity had increased thicker mucous during oral care(removed). Due to pt's presentation of oropharyngeal phase dysphagia and high risk for aspiration and negative sequelae from aspiration, recommend an NPO status w/ frequent oral care and aspiration precautions. ST services will f/u tomorrow w/ ongoing po trials to assess for any improved awareness/alertness to task of eating/drinking. Suspect pt's baseline of cognitive decline/Dementia, and medical illness, may be directly impacting his ability for follow through w/ po tasks. Palliative Care and NSG updated.  SLP Visit Diagnosis: Dysphagia, oropharyngeal phase (R13.12)    Aspiration Risk  Severe aspiration risk;Risk for inadequate nutrition/hydration    Diet Recommendation  NPO status w/ frequent oral care for hygiene and oral stimulation of swallowing; aspiration precautions  Medication Administration: Via alternative means    Other  Recommendations Recommended Consults: (dietician f/u; Palliative care f/u) Oral Care Recommendations: Oral care QID;Staff/trained caregiver to provide oral care Other Recommendations: (TBD)   Follow up Recommendations Skilled Nursing facility      Frequency and Duration min 3x week  2 weeks       Prognosis Prognosis for Safe Diet Advancement: Guarded Barriers to Reach Goals: Cognitive deficits;Severity of deficits      Swallow Study   General Date of Onset: 03/18/17 HPI: Pt is a 84 y.o. male with  a known history of dementia, chronic diasolic heart failure and Diabetes who [facility] with [facility] and altered mental status. Patient was seen in the emergency room on February 2 after having a non-syncopal fall. At that time  head CT and further work up was negative. He presented again yesterday to the emergency room due to complaints of not feeling well. Chest x-ray CBC and BMP were within normal limits along with UA and so patient was sent back to facility. Patient presents today as mentioned due to fevers. EMS reports temperature of 101.1 and 93% room air. His blood sugar was 220. Patient has history of dementia. According to the ER physician who is seen this patient several times in the past it appears the patient's dementia may be progressing. NSG had concern for pt's ability to safely tolerate an oral diet and requested ST services.  Type of Study: Bedside Swallow Evaluation Previous Swallow Assessment: none reported Diet Prior to this Study: Regular;Thin liquids(ordered at admission per MD) Temperature Spikes Noted: (99.1; wbc 10.0) Respiratory Status: Room air History of Recent Intubation: No Behavior/Cognition: Cooperative;Confused;Doesn't follow directions;Requires cueing(awake but presented w/ decreased awareness for task) Oral Cavity Assessment: Excessive secretions(thick mucous) Oral Care Completed by SLP: Yes Oral Cavity - Dentition: Adequate natural dentition Vision: (n/a) Self-Feeding Abilities: Total assist Patient Positioning: Upright in bed Baseline Vocal Quality: (nonverbal) Volitional Cough: Cognitively unable to elicit Volitional Swallow: Unable to elicit    Oral/Motor/Sensory Function Overall Oral Motor/Sensory Function: (could not assess fully d/t Cognitive decline)   Ice Chips Ice chips: Impaired Presentation: Spoon(fed; 3 trials) Oral Phase Impairments: Reduced labial seal;Reduced lingual movement/coordination;Poor awareness of bolus Oral Phase Functional Implications: Prolonged oral transit;Oral holding(loss of bolus) Pharyngeal Phase Impairments: (no pharyngeal swallow appreciated)   Thin Liquid Thin Liquid: Not tested    Nectar Thick Nectar Thick Liquid: Not tested   Honey Thick  Honey Thick Liquid: Impaired Presentation: Spoon(2 trials) Oral Phase Impairments: Reduced labial seal;Reduced lingual movement/coordination;Poor awareness of bolus Oral Phase Functional Implications: Prolonged oral transit;Oral holding(anterior loss of bolus) Pharyngeal Phase Impairments: (no pharyngeal swallow appreciated; bolus residue removed)   Puree Puree: Impaired Presentation: Spoon(fed; 1 trial) Oral Phase Impairments: Reduced labial seal;Reduced lingual movement/coordination;Poor awareness of bolus Oral Phase Functional Implications: Oral holding Pharyngeal Phase Impairments: (same as w/ Honey consistency liquid)   Solid   GO   Solid: Not tested        Jerilynn SomKatherine Watson, MS, CCC-SLP Watson,Katherine 03/19/2017,5:15 PM

## 2017-03-19 NOTE — Progress Notes (Signed)
np Jesus Holland was  Questioned about dnr status. Order was to place order for dnr. danielle rn witnessed order. No further orders at this time.

## 2017-03-19 NOTE — Progress Notes (Signed)
Attempted to feed pt with apple sauce. Pt pocketed apple sauce and was unable to swallow. Pt was made NPO and speech eval was added. MD Gouru was notified of change.

## 2017-03-19 NOTE — Consult Note (Signed)
Consultation Note Date: 03/19/2017   Patient Name: Jesus Holland  DOB: 05-14-1928  MRN: [id number]  Age / Sex: 84 y.o., male  PCP: Marguarite Arbour, MD Referring Physician: Ramonita Lab, MD  Reason for Consultation: Establishing goals of care  HPI/Patient Profile: Jesus Holland  is a 84 y.o. male with a known history of dementia, chronic diasolic heart failure and Diabetes who presents from the [place] with fevers and altered mental status. Patient was seen in the emergency room on February 2 after having a non-syncopal fall. At that time head CT and further work up was negative. He presented again yesterday to the emergency room due to complaints of not feeling well. Chest x-ray CBC and BMP were within normal limits along with UA and so patient was sent back to facility. Patient presents today as mentioned due to fevers.    Clinical Assessment and Goals of Care: Jesus Holland is resting in bed. He tries to speak, but mumbles. Per nursing, they brought ice cream for him earlier, and he lit up, but then didn't know what to do with it. I spoke with his legal guardian Jesus Holland. 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 She states he had been ambulatory, but has been in a wheelchair for weeks.   We discussed diagnosis, prognosis, GOC, EOL wishes disposition and options.  A detailed discussion was had today regarding advanced directives.  Concepts specific to code status, artifical feeding and hydration, and rehospitalization was had.  The difference between an aggressive medical intervention path  and a hospice comfort care path for this patient at this time was discussed.  Values and goals of care important to patient and family were attempted to be elicited.  Jesus Holland states she does not want him to suffer. She would not want for him to have a feeding tube,  dialysis, surgery, and would like a DNR status. Upon further discussion, she would like comfort care measures for Jesus Holland. MOST form placed in chart for her to sign this afternoon. We discussed re-evaluation in the morning with plans for hospice facility if < 2 weeks, and return to the facility with hospice if < 6 months.   Patient has legal guardian Jesus Holland [phone]. [phone].      SUMMARY OF RECOMMENDATIONS    Jesus Holland and I discussed re-evaluation of status in the morning  with plans for hospice facility if < 2 weeks prognosis, and return to the facility with hospice if < 6 months prognosis.   MOST form left in chart filled out for full comfort measures to be signed by Jesus Holland.     Code Status/Advance Care Planning:  Full code    Symptom Management:   Per primary team.   Palliative Prophylaxis:   Oral Care   Prognosis:   At this time, predicted prognosis is < 2 weeks.   Discharge Planning: To Be Determined      Primary Diagnoses: Present on Admission: .  Sepsis (HCC)   I have reviewed the medical record, interviewed the patient and family, and examined the patient. The following aspects are pertinent.  Past Medical History:  Diagnosis Date  . Arthritis   . CHF (congestive heart failure) (HCC)   . Dementia   . Diabetes mellitus without complication (HCC)   . Hypertension    Social History   Socioeconomic History  . Marital status: Single    Spouse name: None  . Number of children: None  . Years of education: None  . Highest education level: None  Social Needs  . Financial resource strain: None  . Food insecurity - worry: None  . Food insecurity - inability: None  . Transportation needs - medical: None  . Transportation needs - non-medical: None  Occupational History  . Occupation: retired  Tobacco Use  . Smoking status: Never Smoker  . Smokeless tobacco: Never Used  Substance and Sexual Activity  . Alcohol use: No  . Drug use: No  .  Sexual activity: None  Other Topics Concern  . None  Social History Narrative  . None   Family History  Family history unknown: Yes   Scheduled Meds: . amLODipine  10 mg Oral Daily  . chlorhexidine  15 mL Mouth Rinse BID  . docusate sodium  100 mg Oral Daily  . enoxaparin (LOVENOX) injection  40 mg Subcutaneous Q24H  . insulin aspart  0-9 Units Subcutaneous TID WC  . insulin glargine  30 Units Subcutaneous QHS  . LORazepam  0.5 mg Oral BID  . losartan  100 mg Oral Daily  . mouth rinse  15 mL Mouth Rinse q12n4p  . risperiDONE  2 mg Oral BID  . vitamin B-12  1,000 mcg Oral Daily   Continuous Infusions: . sodium chloride 75 mL/hr at 03/19/17 0510  . piperacillin-tazobactam (ZOSYN)  IV Stopped (03/19/17 0917)   PRN Meds:.acetaminophen, bisacodyl, ondansetron **OR** ondansetron (ZOFRAN) IV, polyethylene glycol, traMADol Medications Prior to Admission:  Prior to Admission medications   Medication Sig Start Date End Date Taking? Authorizing Provider  acetaminophen (TYLENOL) 325 MG tablet Take 2 tablets (650 mg total) by mouth every 6 (six) hours as needed. 11/17/16  Yes Sharman CheekStafford, Phillip, MD  amLODipine (NORVASC) 10 MG tablet Take 10 mg by mouth daily.   Yes [provider]  Cyanocobalamin (VITAMIN B12) 1000 MCG TBCR Take 1 tablet by mouth daily.   Yes [provider]  docusate sodium (COLACE) 100 MG capsule Take 100 mg by mouth daily.   Yes [provider]  insulin glargine (LANTUS) 100 UNIT/ML injection Inject 0.3 mLs (30 Units total) into the skin daily. Patient taking differently: Inject 30 Units into the skin at bedtime.  11/16/16  Yes Katha HammingKonidena, Snehalatha, MD  LORazepam (ATIVAN) 0.5 MG tablet Take 1 tablet (0.5 mg total) by mouth every 8 (eight) hours as needed for anxiety. Patient taking differently: Take 0.5 mg by mouth 2 (two) times daily.  11/12/16  Yes Katha HammingKonidena, Snehalatha, MD  losartan (COZAAR) 100 MG tablet Take 1 tablet (100 mg total) by mouth  daily. 11/13/16  Yes Katha HammingKonidena, Snehalatha, MD  risperiDONE (RISPERDAL M-TABS) 2 MG disintegrating tablet Take 1 tablet (2 mg total) by mouth 2 (two) times daily. 11/15/16  Yes Clapacs, Jackquline DenmarkJohn T, MD   Allergies  Allergen Reactions  . Aspirin Nausea And Vomiting  . Escitalopram Other (See Comments)    unknown  . Metformin Other (See Comments)    unknown  . Quinine Nausea And Vomiting  Review of Systems  Unable to perform ROS   Physical Exam  Constitutional: No distress.  HENT:  Head: Normocephalic.  Pulmonary/Chest: Effort normal.  Moist cough  Neurological: He is alert.  Mumbles, unable to understand.     Vital Signs: BP (!) 147/69 (BP Location: Left Arm)   Pulse 79   Temp 97.6 F (36.4 C) (Oral)   Resp 18   Ht 5\' 10"  (1.778 m)   Wt 68.2 kg (150 lb 6.4 oz)   SpO2 97%   BMI 21.58 kg/m  Pain Assessment: No/denies pain   Pain Score: 0-No pain   SpO2: SpO2: 97 % O2 Device:SpO2: 97 % O2 Flow Rate: .   IO: Intake/output summary:   Intake/Output Summary (Last 24 hours) at 03/19/2017 1257 Last data filed at 03/19/2017 0225 Gross per 24 hour  Intake 701.5 ml  Output -  Net 701.5 ml    LBM: Last BM Date: (UTA pt cannot remember) Baseline Weight: Weight: 74.4 kg (164 lb) Most recent weight: Weight: 68.2 kg (150 lb 6.4 oz)     Palliative Assessment/Data: NPO     Time In: 12:20 Time Out: 1:15 Time Total: 50 min Greater than 50%  of this time was spent counseling and coordinating care related to the above assessment and plan.  Signed by: Morton Stall, NP   Please contact Palliative Medicine Team phone at [phone] for questions and concerns.  For individual provider: See Loretha Stapler

## 2017-03-19 NOTE — NC FL2 (Signed)
[place] MEDICAID FL2 LEVEL OF CARE SCREENING TOOL     IDENTIFICATION  Patient Name: Jesus Holland Birthdate: 1928-02-20 Sex: male Admission Date (Current Location): 03/18/2017  [place] and [place] Number:  Chiropodist and Address:  [facility], [address], [place], [place] [address]      Provider Number: [id number]  Attending Physician Name and Address:  Ramonita Lab, MD  Relative Name and Phone Number:       Current Level of Care: Hospital Recommended Level of Care: Assisted Living Facility([facility]) Prior Approval Number:    Date Approved/Denied:   PASRR Number: [id number])  Discharge Plan: Domiciliary (Rest home)([facility])    Current Diagnoses: Patient Active Problem List   Diagnosis Date Noted  . Sepsis (HCC) 03/18/2017  . Murmur, cardiac 02/26/2017  . Aortic valve disease 02/26/2017  . Chest pain with moderate risk for cardiac etiology 02/23/2017  . Psychosis (HCC) 10/29/2016  . Hyperglycemia 10/23/2016  . Dementia, Alzheimer's, with behavior disturbance 10/23/2016    Orientation RESPIRATION BLADDER Height & Weight     Self  Normal Incontinent Weight: 150 lb 6.4 oz (68.2 kg) Height:  5\' 10"  (177.8 cm)  BEHAVIORAL SYMPTOMS/MOOD NEUROLOGICAL BOWEL NUTRITION STATUS      Continent Diet(Soft)  AMBULATORY STATUS COMMUNICATION OF NEEDS Skin   Extensive Assist Verbally Normal                       Personal Care Assistance Level of Assistance  Bathing, Feeding, Dressing Bathing Assistance: Limited assistance Feeding assistance: Independent Dressing Assistance: Limited assistance     Functional Limitations Info  Sight, Hearing, Speech Sight Info: Adequate Hearing Info: Adequate Speech Info: Impaired    SPECIAL CARE FACTORS FREQUENCY  PT (By licensed PT)     PT Frequency: (2-3 Home Health)              Contractures      Additional Factors Info  Code Status, Allergies Code Status Info:  (Full Code) Allergies Info: (ASPIRIN, ESCITALOPRAM, METFORMIN, QUININE )           Current Medications (03/19/2017):  This is the current hospital active medication list Current Facility-Administered Medications  Medication Dose Route Frequency Provider Last Rate Last Dose  . 0.9 %  sodium chloride infusion   Intravenous Continuous Adrian Saran, MD 75 mL/hr at 03/19/17 0510    . acetaminophen (TYLENOL) tablet 650 mg  650 mg Oral Q6H PRN Mody, Sital, MD      . amLODipine (NORVASC) tablet 10 mg  10 mg Oral Daily Mody, Sital, MD      . bisacodyl (DULCOLAX) EC tablet 5 mg  5 mg Oral Daily PRN Mody, Sital, MD      . chlorhexidine (PERIDEX) 0.12 % solution 15 mL  15 mL Mouth Rinse BID Adrian Saran, MD   15 mL at 03/18/17 2224  . docusate sodium (COLACE) capsule 100 mg  100 mg Oral Daily Mody, Sital, MD      . enoxaparin (LOVENOX) injection 40 mg  40 mg Subcutaneous Q24H Mody, Sital, MD   40 mg at 03/18/17 1721  . insulin aspart (novoLOG) injection 0-9 Units  0-9 Units Subcutaneous TID WC Adrian Saran, MD   5 Units at 03/18/17 1721  . insulin glargine (LANTUS) injection 30 Units  30 Units Subcutaneous QHS Adrian Saran, MD   30 Units at 03/18/17 2224  . LORazepam (ATIVAN) tablet 0.5 mg  0.5 mg Oral BID Mody, Sital,  MD      . losartan (COZAAR) tablet 100 mg  100 mg Oral Daily Mody, Sital, MD      . MEDLINE mouth rinse  15 mL Mouth Rinse q12n4p Mody, Sital, MD   15 mL at 03/18/17 1722  . ondansetron (ZOFRAN) tablet 4 mg  4 mg Oral Q6H PRN Adrian SaranMody, Sital, MD       Or  . ondansetron (ZOFRAN) injection 4 mg  4 mg Intravenous Q6H PRN Mody, Sital, MD      . piperacillin-tazobactam (ZOSYN) IVPB 3.375 g  3.375 g Intravenous Q8H Hallaji, Sheema M, RPH 12.5 mL/hr at 03/19/17 0544 3.375 g at 03/19/17 0544  . polyethylene glycol (MIRALAX / GLYCOLAX) packet 17 g  17 g Oral Daily PRN Mody, Sital, MD      . risperiDONE (RISPERDAL M-TABS) disintegrating tablet 2 mg  2 mg Oral BID Mody, Sital, MD      . traMADol (ULTRAM)  tablet 50 mg  50 mg Oral Q6H PRN Mody, Sital, MD      . vitamin B-12 (CYANOCOBALAMIN) tablet 1,000 mcg  1,000 mcg Oral Daily Adrian SaranMody, Sital, MD         Discharge Medications: Please see discharge summary for a list of discharge medications.  Relevant Imaging Results:  Relevant Lab Results:   Additional Information (SSN: [id number])  Payton SparkAnanda A Zainab Crumrine, Student-Social Work

## 2017-03-20 LAB — MRSA CULTURE: CULTURE: NOT DETECTED

## 2017-03-20 LAB — GLUCOSE, CAPILLARY
GLUCOSE-CAPILLARY: 126 mg/dL — AB (ref 65–99)
GLUCOSE-CAPILLARY: 88 mg/dL (ref 65–99)
Glucose-Capillary: 62 mg/dL — ABNORMAL LOW (ref 65–99)
Glucose-Capillary: 81 mg/dL (ref 65–99)
Glucose-Capillary: 93 mg/dL (ref 65–99)

## 2017-03-20 MED ORDER — ACETAMINOPHEN 650 MG RE SUPP: 650.0000 mg | Freq: Four times a day (QID) | RECTAL | Status: DC | PRN

## 2017-03-20 MED ORDER — HALOPERIDOL LACTATE 2 MG/ML PO CONC
0.5000 mg | ORAL | Status: DC | PRN
  Filled 2017-03-20: qty 0.3

## 2017-03-20 MED ORDER — BIOTENE DRY MOUTH MT LIQD: 15.0000 mL | OROMUCOSAL | Status: DC | PRN

## 2017-03-20 MED ORDER — ONDANSETRON 4 MG PO TBDP: 4.0000 mg | ORAL_TABLET | Freq: Four times a day (QID) | ORAL | Status: DC | PRN

## 2017-03-20 MED ORDER — GLYCOPYRROLATE 1 MG PO TABS
1.0000 mg | ORAL_TABLET | ORAL | Status: DC | PRN
  Filled 2017-03-20: qty 1

## 2017-03-20 MED ORDER — GLYCOPYRROLATE 0.2 MG/ML IJ SOLN
0.2000 mg | INTRAMUSCULAR | Status: DC | PRN
  Filled 2017-03-20: qty 1

## 2017-03-20 MED ORDER — HALOPERIDOL LACTATE 5 MG/ML IJ SOLN: 0.5000 mg | INTRAMUSCULAR | Status: DC | PRN

## 2017-03-20 MED ORDER — HALOPERIDOL 0.5 MG PO TABS
0.5000 mg | ORAL_TABLET | ORAL | Status: DC | PRN
  Filled 2017-03-20: qty 1

## 2017-03-20 MED ORDER — ACETAMINOPHEN 325 MG PO TABS: 650.0000 mg | ORAL_TABLET | Freq: Four times a day (QID) | ORAL | Status: DC | PRN

## 2017-03-20 MED ORDER — ONDANSETRON HCL 4 MG/2ML IJ SOLN: 4.0000 mg | Freq: Four times a day (QID) | INTRAMUSCULAR | Status: DC | PRN

## 2017-03-20 MED ORDER — DEXTROSE 50 % IV SOLN
25.0000 mL | Freq: Once | INTRAVENOUS | Status: AC
  Administered 2017-03-20: 25 mL via INTRAVENOUS

## 2017-03-20 MED ORDER — DEXTROSE-NACL 5-0.9 % IV SOLN
INTRAVENOUS | Status: DC
  Administered 2017-03-20: 02:00:00 via INTRAVENOUS

## 2017-03-20 MED ORDER — POLYVINYL ALCOHOL 1.4 % OP SOLN
1.0000 [drp] | Freq: Four times a day (QID) | OPHTHALMIC | Status: DC | PRN
  Filled 2017-03-20: qty 15

## 2017-03-20 MED ORDER — DEXTROSE 50 % IV SOLN
INTRAVENOUS | Status: AC
  Administered 2017-03-20: 25 mL via INTRAVENOUS
  Filled 2017-03-20: qty 50

## 2017-03-20 MED ORDER — MORPHINE SULFATE (PF) 2 MG/ML IV SOLN: 1.0000 mg | INTRAVENOUS | Status: DC | PRN

## 2017-03-20 NOTE — Progress Notes (Addendum)
  Speech Language Pathology Treatment: Dysphagia  Patient Details Name: Jesus Holland MRN: [id number] DOB: Nov 11, 1928 Today's Date: 03/20/2017 Time: 1430-1520 SLP Time Calculation (min) (ACUTE ONLY): 50 min  Assessment / Plan / Recommendation Clinical Impression  Pt seen for ongoing assessment of swallow function and potential toleration of po's to safely initiate an oral diet. Palliative Care following for GOC. Pt verbalized in short sentence length 3-4x w/ SLP asking for "ice cream" 2x. He continues to have difficulty swallowing his own secretions w/ min increased phlegm noted. SLP performed oral care to clean mouth.    HPI HPI: Pt is a 84 y.o. male with a known history of dementia, chronic diasolic heart failure and Diabetes who presents from the [facility] with fevers and altered mental status. Patient was seen in the emergency room on February 2 after having a non-syncopal fall. At that time head CT and further work up was negative. He presented again yesterday to the emergency room due to complaints of not feeling well. Chest x-ray CBC and BMP were within normal limits along with UA and so patient was sent back to facility. Patient presents today as mentioned due to fevers. EMS reports temperature of 101.1 and 93% room air. His blood sugar was 220. Patient has history of dementia. According to the ER physician who is seen this patient several times in the past it appears the patient's dementia may be progressing. NSG had concern for pt's ability to safely tolerate an oral diet and requested ST services.       SLP Plan  Goals updated(per Palliative care addressing comfort care GOC)       Recommendations  Diet recommendations: (pleasure po bites/sips (sherbert, ice chips)) Liquids provided via: Teaspoon;No straw(no cup) Medication Administration: Via alternative means Supervision: Full supervision/cueing for compensatory strategies;Staff to assist with self feeding Compensations: Minimize  environmental distractions;Slow rate;Small sips/bites;Lingual sweep for clearance of pocketing;Multiple dry swallows after each bite/sip(encourage a throat clear/cough when needed) Postural Changes and/or Swallow Maneuvers: Seated upright 90 degrees;Upright 30-60 min after meal                General recommendations: (Palliative care following) Oral Care Recommendations: Oral care QID;Staff/trained caregiver to provide oral care Follow up Recommendations: (TBD - Hospice) SLP Visit Diagnosis: Dysphagia, oropharyngeal phase (R13.12) Plan: Goals updated(per Palliative care addressing comfort care GOC)       GO                Jerilynn SomKatherine Watson, MS, CCC-SLP Watson,Katherine 03/20/2017, 4:21 PM

## 2017-03-20 NOTE — Progress Notes (Signed)
New hosp[facility] home referral received from CSW [facility] following a Palliative Medicine consult. Plan is for discharge to the hospice home on 2/7. Full note to follow. Dayna BarkerKaren Robertson RN, BSN, [facility], hospital liaison [phone]

## 2017-03-20 NOTE — Progress Notes (Signed)
Clinical Social Worker (CSW) received a residential hospice consult from palliative team. Patient's guardian [name] is agreeable [place] and requested for consent forms to be faxed to [phone]. [facility] is aware of above.   [employer]iley Terin Dierolf, LCSW [phone]

## 2017-03-20 NOTE — Progress Notes (Signed)
Hypoglycemic Event  CBG: 62  Treatment: D50 IV 25 mL  Symptoms: None  Follow-up CBG: Time:0241 CBG Result:126  Possible Reasons for Event: Inadequate meal intake, Medication regimen: Night Lantus  and Other: NPO   Comments/MD notified: Pyreddy     Carmeline Kowal [employer] Kaylynne Andres

## 2017-03-20 NOTE — Progress Notes (Signed)
[facility] [place] [facility]   PATIENT NAME: Jesus PagesVictor Holland    MR#:  [id number]  DATE OF BIRTH:  30-Nov-1928  SUBJECTIVE:  CHIEF COMPLAINT: Patient is altered, unable to get any history from him.  Patient has baseline dementia.  Having difficulty to swallow p.o. medications  REVIEW OF SYSTEMS:  Unobtainable as the patient is encephalopathic.   DRUG ALLERGIES:   Allergies  Allergen Reactions  . Aspirin Nausea And Vomiting  . Escitalopram Other (See Comments)    unknown  . Metformin Other (See Comments)    unknown  . Quinine Nausea And Vomiting    VITALS:  Blood pressure (!) 131/58, pulse 74, temperature 98.1 F (36.7 C), temperature source Oral, resp. rate 18, height 5\' 10"  (1.778 m), weight 68.2 kg (150 lb 6.4 oz), SpO2 96 %.  PHYSICAL EXAMINATION:  GENERAL:  84 y.o.-year-old patient lying in the bed with no acute distress.  EYES: Pupils equal, round, reactive to light and accommodation. No scleral icterus.   HEENT: Head atraumatic, normocephalic. Oropharynx and nasopharynx clear NECK:  Supple, no jugular venous distention. No thyroid enlargement, no tenderness.  LUNGS: Normal breath sounds bilaterally, no wheezing, rales,rhonchi or crepitation. No use of accessory muscles of respiration.  CARDIOVASCULAR: S1, S2 normal. No murmurs, rubs, or gallops.  ABDOMEN: Soft, nontender, nondistended. Bowel sounds present.  EXTREMITIES: No pedal edema, cyanosis, or clubbing.  Neurologic patient is altered      LABORATORY PANEL:   CBC Recent Labs  Lab 03/19/17 0718  WBC 10.0  HGB 12.2*  HCT 35.5*  PLT 305   ------------------------------------------------------------------------------------------------------------------  Chemistries  Recent Labs  Lab 03/18/17 0950 03/19/17 0612  NA 140 143  K 3.9 3.4*  CL 109 115*  CO2 15* 23  GLUCOSE 223* 171*  BUN 31* 31*  CREATININE 1.10 0.89  CALCIUM 8.6* 8.2*  AST 14*  --   ALT 13*  --    ALKPHOS 84  --   BILITOT 1.4*  --    ------------------------------------------------------------------------------------------------------------------  Cardiac Enzymes Recent Labs  Lab 03/18/17 0950  TROPONINI <0.03   ------------------------------------------------------------------------------------------------------------------  RADIOLOGY:  Ct Head Wo Contrast  Result Date: 03/18/2017 CLINICAL DATA:  Altered level of consciousness. EXAM: CT HEAD WITHOUT CONTRAST TECHNIQUE: Contiguous axial images were obtained from the base of the skull through the vertex without intravenous contrast. COMPARISON:  CT scan of March 16, 2017. FINDINGS: Brain: Mild diffuse cortical atrophy is noted. Mild chronic ischemic white matter disease is noted. No mass effect or midline shift is noted. Ventricular size is within normal limits. There is no evidence of mass lesion, hemorrhage or acute infarction. Vascular: No hyperdense vessel or unexpected calcification. Skull: Normal. Negative for fracture or focal lesion. Sinuses/Orbits: No acute finding. Other: None. IMPRESSION: Mild diffuse cortical atrophy. Mild chronic ischemic white matter disease. No acute intracranial abnormality seen. Electronically Signed   By: Lupita RaiderJames  Green Jr, M.D.   On: 03/18/2017 14:16    EKG:   Orders placed or performed during the hospital encounter of 03/18/17  . EKG 12-Lead  . EKG 12-Lead    ASSESSMENT AND PLAN:   84 year old male with moderate to severe dementia who presents from the [facility] with fever and altered mental status.  1. Sepsis: Patient presents with fever, leukocytosis and tachycardia Etiology of sepsis is unclear at this time Negative.Chest x-ray CT head is negative  CBC and UA are within normal limits. Continue empiric Zosyn  blood cultures negative so far  2. Acute encephalopathy on  chronic dementia and adult failure to thrive Palliative care is following, discussed with patient's legal guardian vanda  Maisie Fus plan is to provide hospice care at hospice home -Evaluated by speech therapy recommending strict n.p.o. with oral care  3. Dementia: It appears that this may be progressive .  Currently n.p.o. as patient is at high risk for aspiration  4. Diabetes:    5. Essential hypertension:  Disposition hospice home when bed is available   All the records are reviewed and case discussed with Care Management/Social Workerr. Management plans discussed with the patient, family and they are in agreement.  CODE STATUS: DNR, ward of state.  Comfort care DSS social worker is following  TOTAL TIME TAKING CARE OF THIS PATIENT: 26  minutes.   POSSIBLE D/C IN 1-2 DAYS, DEPENDING ON CLINICAL CONDITION.  Note: This dictation was prepared with Dragon dictation along with smaller phrase technology. Any transcriptional errors that result from this process are unintentional.   Ramonita Lab M.D on 03/20/2017 at 2:08 PM  Between 7am to 6pm - Pager - [phone] After 6pm go to [email or web address] - password [email or web address] [facility]  [facility]  Office  [phone]  CC: Primary care physician; Marguarite Arbour, MD

## 2017-03-20 NOTE — Progress Notes (Addendum)
Daily Progress Note   Patient Name: Jesus Holland       Date: 03/20/2017 DOB: Nov 03, 1928  Age: 84 y.o. MRN#: [id number] Attending Physician: Ramonita Lab, MD Primary Care Physician: Marguarite Arbour, MD Admit Date: 03/18/2017  Reason for Consultation/Follow-up: Establishing goals of care  Subjective: Mr. Cervini is resting in bed. He gave a thumbs up when asked how he is feeling. He is non-verbal, and gives no facial expressions. Per nursing, they have needed to suction him as he is having difficulty swallowing his secretions.   MOST form for comfort care has been signed by legal guardian Hilbert Odor.   Length of Stay: 2  Current Medications: Scheduled Meds:  . chlorhexidine  15 mL Mouth Rinse BID  . LORazepam  0.5 mg Oral BID  . mouth rinse  15 mL Mouth Rinse q12n4p  . risperiDONE  2 mg Oral BID    Continuous Infusions:   PRN Meds: ondansetron **OR** ondansetron (ZOFRAN) IV  Physical Exam  Constitutional: No distress.  HENT:  Mouth/Throat: Oropharyngeal exudate present.  Pulmonary/Chest: Effort normal.  Neurological: He is alert.  Skin: Skin is warm and dry.            Vital Signs: BP (!) 151/67 (BP Location: Left Arm)   Pulse 75   Temp 97.7 F (36.5 C) (Oral)   Resp 18   Ht 5\' 10"  (1.778 m)   Wt 68.2 kg (150 lb 6.4 oz)   SpO2 96%   BMI 21.58 kg/m  SpO2: SpO2: 96 % O2 Device: O2 Device: Not Delivered O2 Flow Rate:    Intake/output summary:   Intake/Output Summary (Last 24 hours) at 03/20/2017 1321 Last data filed at 03/20/2017 0600 Gross per 24 hour  Intake 2220 ml  Output -  Net 2220 ml   LBM: Last BM Date: (pt. unable to tell) Baseline Weight: Weight: 74.4 kg (164 lb) Most recent weight: Weight: 68.2 kg (150 lb 6.4 oz)       Palliative  Assessment/Data: 10%      Patient Active Problem List   Diagnosis Date Noted  . Sepsis (HCC) 03/18/2017  . Murmur, cardiac 02/26/2017  . Aortic valve disease 02/26/2017  . Chest pain with moderate risk for cardiac etiology 02/23/2017  . Psychosis (HCC) 10/29/2016  . Hyperglycemia 10/23/2016  . Dementia, Alzheimer's, with  behavior disturbance 10/23/2016    Palliative Care Assessment & Plan   Patient Profile: [name] a88 y.o.malewith a known history of dementia, chronic diasolic heart failure and Diabetes who [facility] and [facility] mental status. Patient was seen in the emergency room on February 2 after having a non-syncopal fall. At that time head CT and further work up was negative. He presented again yesterday to the emergency room due to complaints of not feeling well. Chest x-ray CBC and BMP were within normal limits along with UA and so patient was sent back to facility. Patient presents today as mentioned due to fevers.   Assessment/ Recommendations/Plan:  Difficulty swallowing secretions. Oral medications discontinued.   Recommendations for hospice facility.    MOST form for comfort care has been signed by legal guardian Hilbert OdorVanda Thomas.   Goals of Care and Additional Recommendations:  Limitations on Scope of Treatment: Comfort care  Code Status:    Code Status Orders  (From admission, onward)        Start     Ordered   03/19/17 1710  Do not attempt resuscitation (DNR)  Continuous    Question Answer Comment  In the event of cardiac or respiratory ARREST Do not call a "code blue"   In the event of cardiac or respiratory ARREST Do not perform Intubation, CPR, defibrillation or ACLS   In the event of cardiac or respiratory ARREST Use medication by any route, position, wound care, and other measures to relive pain and suffering. May use oxygen, suction and manual treatment of airway obstruction as needed for comfort.      03/19/17  1710    Code Status History    Date Active Date Inactive Code Status Order ID Comments User Context   03/18/2017 15:30 03/19/2017 16:57 Full Code [id number]  Adrian SaranMody, Sital, MD Inpatient   10/27/2016 04:17 11/15/2016 16:15 Full Code [id number]  Ihor AustinPyreddy, Pavan, MD Inpatient   10/23/2016 18:42 10/26/2016 21:03 Full Code [id number]  Altamese DillingVachhani, Vaibhavkumar, MD Inpatient       Prognosis:   < 2 weeks Not able to swallow secretions. Non-verbal is new over the past few days.   Discharge Planning:  Hospice facility  Care plan was discussed with Dr. Geroge BasemanGouru, Vanda, CM, RN.   Thank you for allowing the Palliative Medicine Team to assist in the care of this patient.   Total Time 35 min Prolonged Time Billed NO      Greater than 50%  of this time was spent counseling and coordinating care related to the above assessment and plan.  Morton Stallrystal Markela Wee, NP  Please contact Palliative Medicine Team phone at [phone] for questions and concerns.

## 2017-03-21 MED ORDER — ONDANSETRON HCL 4 MG/2ML IJ SOLN
4.0000 mg | Freq: Four times a day (QID) | INTRAMUSCULAR | 0 refills | Status: AC | PRN
Start: 2017-03-21 — End: ?

## 2017-03-21 MED ORDER — CHLORHEXIDINE GLUCONATE 0.12 % MT SOLN: 15.0000 mL | Freq: Two times a day (BID) | OROMUCOSAL | 0 refills | Status: AC

## 2017-03-21 MED ORDER — HALOPERIDOL LACTATE 5 MG/ML IJ SOLN: 0.5000 mg | INTRAMUSCULAR | Status: AC | PRN

## 2017-03-21 MED ORDER — MORPHINE SULFATE (PF) 2 MG/ML IV SOLN: 1.0000 mg | INTRAVENOUS | 0 refills | Status: AC | PRN

## 2017-03-21 MED ORDER — LORAZEPAM 0.5 MG PO TABS: 0.5000 mg | ORAL_TABLET | Freq: Three times a day (TID) | ORAL | 0 refills | Status: AC | PRN

## 2017-03-21 MED ORDER — GLYCOPYRROLATE 0.2 MG/ML IJ SOLN: 0.2000 mg | INTRAMUSCULAR | Status: AC | PRN

## 2017-03-21 MED ORDER — ACETAMINOPHEN 650 MG RE SUPP: 650.0000 mg | Freq: Four times a day (QID) | RECTAL | 0 refills | Status: AC | PRN

## 2017-03-21 NOTE — Progress Notes (Signed)
[facility] patient can come to the hospice facility today and they will work on getting consents from guardian [name]. Clinical Child psychotherapistocial Worker (CSW) completed D/C packet including DNR. Patient's guardian [name] is agreeable for patient to D[facility]. Please reconsult if future social work needs arise. CSW signing off.   [employer]iley Baldemar Dady, LCSW [phone]

## 2017-03-21 NOTE — Progress Notes (Signed)
Daily Progress Note   Patient Name: Jesus Holland       Date: 03/21/2017 DOB: 1928/10/09  Age: 84 y.o. MRN#: [id number] Attending Physician: Alford Highland, MD Primary Care Physician: Marguarite Arbour, MD Admit Date: 03/18/2017  Reason for Consultation/Follow-up: Establishing goals of care  Subjective: Jesus Holland is resting in bed. He is minimally verbal today with speech that is difficult to understand. He denies complaint. He states his favorite pianist is Community education officer.    MOST form in chart for comfort care has been signed by legal guardian Hilbert Odor.   Length of Stay: 3  Current Medications: Scheduled Meds:  . chlorhexidine  15 mL Mouth Rinse BID  . mouth rinse  15 mL Mouth Rinse q12n4p  . risperiDONE  2 mg Oral BID    Continuous Infusions:   PRN Meds: acetaminophen **OR** acetaminophen, antiseptic oral rinse, glycopyrrolate **OR** glycopyrrolate **OR** glycopyrrolate, haloperidol **OR** haloperidol **OR** haloperidol lactate, morphine injection, ondansetron **OR** ondansetron (ZOFRAN) IV, polyvinyl alcohol  Physical Exam  Constitutional: No distress.  HENT:  Mouth/Throat: Oropharyngeal exudate present.  Pulmonary/Chest: Effort normal.  Neurological: He is alert.  Speech is difficult to understand.   Skin: Skin is warm and dry.            Vital Signs: BP (!) 151/67 (BP Location: Left Arm)   Pulse 81   Temp 97.6 F (36.4 C) (Oral)   Resp 14   Ht 5\' 10"  (1.778 m)   Wt 68.2 kg (150 lb 6.4 oz)   SpO2 97%   BMI 21.58 kg/m  SpO2: SpO2: 97 % O2 Device: O2 Device: Not Delivered O2 Flow Rate:    Intake/output summary:  No intake or output data in the 24 hours ending 03/21/17 1049 LBM: Last BM Date: (pt. unable to tell) Baseline Weight: Weight: 74.4 kg (164 lb) Most  recent weight: Weight: 68.2 kg (150 lb 6.4 oz)       Palliative Assessment/Data: 10%      Patient Active Problem List   Diagnosis Date Noted  . Sepsis (HCC) 03/18/2017  . Murmur, cardiac 02/26/2017  . Aortic valve disease 02/26/2017  . Chest pain with moderate risk for cardiac etiology 02/23/2017  . Psychosis (HCC) 10/29/2016  . Hyperglycemia 10/23/2016  . Dementia, Alzheimer's, with behavior disturbance 10/23/2016    Palliative Care Assessment &  Plan   Patient Profile: Jesus Holland a88 y.o.malewith a known history of dementia, chronic diasolic heart failure and Diabetes who [facility] and altered mental status. Patient was seen in the emergency room on February 2 after having a non-syncopal fall. At that time head CT and further work up was negative. He presented again yesterday to the emergency room due to complaints of not feeling well. Chest x-ray CBC and BMP were within normal limits along with UA and so patient was sent back to facility. Patient presents today as mentioned due to fevers.   Assessment/ Recommendations/Plan:  Difficulty swallowing secretions. Oral medications discontinued.   Transition to hospice facility today.   No change to comfort medications at this time.  MOST form for comfort care has been signed by legal guardian Hilbert OdorVanda Thomas.   Goals of Care and Additional Recommendations:  Limitations on Scope of Treatment: Comfort care  Code Status:    Code Status Orders  (From admission, onward)        Start     Ordered   03/19/17 1710  Do not attempt resuscitation (DNR)  Continuous    Question Answer Comment  In the event of cardiac or respiratory ARREST Do not call a "code blue"   In the event of cardiac or respiratory ARREST Do not perform Intubation, CPR, defibrillation or ACLS   In the event of cardiac or respiratory ARREST Use medication by any route, position, wound care, and other measures to relive pain and  suffering. May use oxygen, suction and manual treatment of airway obstruction as needed for comfort.      03/19/17 1710    Code Status History    Date Active Date Inactive Code Status Order ID Comments User Context   03/18/2017 15:30 03/19/2017 16:57 Full Code [id number]  Adrian SaranMody, Sital, MD Inpatient   10/27/2016 04:17 11/15/2016 16:15 Full Code [id number]  Ihor AustinPyreddy, Pavan, MD Inpatient   10/23/2016 18:42 10/26/2016 21:03 Full Code [id number]  Altamese DillingVachhani, Vaibhavkumar, MD Inpatient       Prognosis:   < 2 weeks Difficulty swallowing oral secretions.  Minimally verbal.   Discharge Planning:  Hospice facility    Thank you for allowing the Palliative Medicine Team to assist in the care of this patient.   Total Time 15 min Prolonged Time Billed NO      Greater than 50%  of this time was spent counseling and coordinating care related to the above assessment and plan.  Morton Stallrystal Christiona Siddique, NP  Please contact Palliative Medicine Team phone at [phone] for questions and concerns.

## 2017-03-21 NOTE — Discharge Summary (Signed)
[facility] - [place] [facility]   PATIENT NAME: Jesus Holland    MR#:  [id number]  DATE OF BIRTH:  1929/01/20  DATE OF ADMISSION:  03/18/2017 ADMITTING PHYSICIAN: Adrian Saran, MD  DATE OF DISCHARGE: 03/21/2017  PRIMARY CARE PHYSICIAN: Marguarite Arbour, MD    ADMISSION DIAGNOSIS:  SIRS (systemic inflammatory response syndrome) (HCC) [R65.10] Altered mental status, unspecified altered mental status type [R41.82]  DISCHARGE DIAGNOSIS:  Active Problems:   Sepsis (HCC)   SECONDARY DIAGNOSIS:   Past Medical History:  Diagnosis Date  . Arthritis   . CHF (congestive heart failure) (HCC)   . Dementia   . Diabetes mellitus without complication (HCC)   . Hypertension     HOSPITAL COURSE:   1.  Clinical sepsis with fever leukocytosis and tachycardia.  Patient was initially placed on Zosyn.  Chest x-ray and urine were negative.  Blood cultures are negative.  Antibiotics stopped once made comfort care. 2.  Acute encephalopathy and chronic dementia 3.  Chronic dementia.  Patient failed swallowing evaluation.  Patient is at high risk for aspiration and death.  Patient choked with sherbet last night.  Can give ice chips.  Pured diet with thickened liquids for comfort measures over at the hospice home.  Continue Risperdal if able 4.  Type 2 diabetes mellitus.  Stop insulin at this point 5.  Essential hypertension.  Stop blood pressure medications 6.  History of diastolic congestive heart failure.  No signs of heart failure on this hospital stay.  Patient made comfort care measures.  PRN medications ordered for the hospice home including morphine, Ativan, Haldol, glycopyrrolate.  Patient will go to the hospice home likely this afternoon after paperwork from the guardian and hospice are complete.  DISCHARGE CONDITIONS:   Guarded  CONSULTS OBTAINED:  Palliative care  DRUG ALLERGIES:   Allergies  Allergen Reactions  . Aspirin Nausea And Vomiting  . Escitalopram  Other (See Comments)    unknown  . Metformin Other (See Comments)    unknown  . Quinine Nausea And Vomiting    DISCHARGE MEDICATIONS:   Allergies as of 03/21/2017      Reactions   Aspirin Nausea And Vomiting   Escitalopram Other (See Comments)   unknown   Metformin Other (See Comments)   unknown   Quinine Nausea And Vomiting      Medication List    STOP taking these medications   acetaminophen 325 MG tablet Commonly known as:  TYLENOL Replaced by:  acetaminophen 650 MG suppository   amLODipine 10 MG tablet Commonly known as:  NORVASC   docusate sodium 100 MG capsule Commonly known as:  COLACE   insulin glargine 100 UNIT/ML injection Commonly known as:  LANTUS   losartan 100 MG tablet Commonly known as:  COZAAR   Vitamin B12 1000 MCG Tbcr     TAKE these medications   acetaminophen 650 MG suppository Commonly known as:  TYLENOL Place 1 suppository (650 mg total) rectally every 6 (six) hours as needed for mild pain (or Fever >/= 101). Replaces:  acetaminophen 325 MG tablet   chlorhexidine 0.12 % solution Commonly known as:  PERIDEX 15 mLs by Mouth Rinse route 2 (two) times daily.   glycopyrrolate 0.2 MG/ML injection Commonly known as:  ROBINUL Inject 1 mL (0.2 mg total) into the vein every 4 (four) hours as needed (excessive secretions).   haloperidol lactate 5 MG/ML injection Commonly known as:  HALDOL Inject 0.1 mLs (0.5 mg total) into the vein every  4 (four) hours as needed (or delirium).   LORazepam 0.5 MG tablet Commonly known as:  ATIVAN Take 1 tablet (0.5 mg total) by mouth every 8 (eight) hours as needed for anxiety. What changed:    when to take this  reasons to take this   morphine 2 MG/ML injection Inject 0.5 mLs (1 mg total) into the vein every 2 (two) hours as needed (pain, shortness of breath).   ondansetron 4 MG/2ML Soln injection Commonly known as:  ZOFRAN Inject 2 mLs (4 mg total) into the vein every 6 (six) hours as needed for  nausea.   risperiDONE 2 MG disintegrating tablet Commonly known as:  RISPERDAL M-TABS Take 1 tablet (2 mg total) by mouth 2 (two) times daily.        DISCHARGE INSTRUCTIONS:  Follow-up at hospice home 1 day  If you experience worsening of your admission symptoms, develop shortness of breath, life threatening emergency, suicidal or homicidal thoughts you must seek medical attention immediately by calling 911 or calling your MD immediately  if symptoms less severe.  You Must read complete instructions/literature along with all the possible adverse reactions/side effects for all the Medicines you take and that have been prescribed to you. Take any new Medicines after you have completely understood and accept all the possible adverse reactions/side effects.   Please note  You were cared for by a hospitalist during your hospital stay. If you have any questions about your discharge medications or the care you received while you were in the hospital after you are discharged, you can call the unit and asked to speak with the hospitalist on call if the hospitalist that took care of you is not available. Once you are discharged, your primary care physician will handle any further medical issues. Please note that NO REFILLS for any discharge medications will be authorized once you are discharged, as it is imperative that you return to your primary care physician (or establish a relationship with a primary care physician if you do not have one) for your aftercare needs so that they can reassess your need for medications and monitor your lab values.    Today   CHIEF COMPLAINT:   Chief Complaint  Patient presents with  . Altered Mental Status    HISTORY OF PRESENT ILLNESS:  Jesus Holland  is a 84 y.o. male with a known history of dementia presented with altered mental status and suspected sepsis   VITAL SIGNS:  Blood pressure (!) 151/67, pulse 81, temperature 97.6 F (36.4 C), temperature  source Oral, resp. rate 14, height 5\' 10"  (1.778 m), weight 68.2 kg (150 lb 6.4 oz), SpO2 97 %.  I/O:  No intake or output data in the 24 hours ending 03/21/17 0849  PHYSICAL EXAMINATION:  GENERAL:  84 y.o.-year-old patient lying in the bed with no acute distress.  EYES: Pupils equal, round, reactive to light and accommodation. No scleral icterus. HEENT: Head atraumatic, normocephalic. Oropharynx and nasopharynx clear.  NECK:  Supple, no jugular venous distention. No thyroid enlargement, no tenderness.  LUNGS: Normal breath sounds bilaterally, no wheezing, rales,rhonchi or crepitation. No use of accessory muscles of respiration.  CARDIOVASCULAR: S1, S2 normal.  2 out of 6 systolic murmurs.  No rubs, or gallops.  ABDOMEN: Soft, non-tender, non-distended. Bowel sounds present. No organomegaly or mass.  EXTREMITIES: Trace edema, no cyanosis, or clubbing.  NEUROLOGIC: Cranial nerves II through XII are intact. Muscle strength 5/5 in all extremities. Sensation intact. Gait not checked.  PSYCHIATRIC: The  patient is alert and answers a few yes or no questions.  SKIN: No obvious rash, lesion, or ulcer seen anteriorly  DATA REVIEW:   CBC Recent Labs  Lab 03/19/17 0718  WBC 10.0  HGB 12.2*  HCT 35.5*  PLT 305    Chemistries  Recent Labs  Lab 03/18/17 0950 03/19/17 0612  NA 140 143  K 3.9 3.4*  CL 109 115*  CO2 15* 23  GLUCOSE 223* 171*  BUN 31* 31*  CREATININE 1.10 0.89  CALCIUM 8.6* 8.2*  AST 14*  --   ALT 13*  --   ALKPHOS 84  --   BILITOT 1.4*  --     Cardiac Enzymes Recent Labs  Lab 03/18/17 0950  TROPONINI <0.03    Microbiology Results  Results for orders placed or performed during the hospital encounter of 03/18/17  Blood culture (routine x 2)     Status: None (Preliminary result)   Collection Time: 03/18/17  9:50 AM  Result Value Ref Range Status   Specimen Description BLOOD LEFT ANTECUBITAL  Final   Special Requests   Final    BOTTLES DRAWN AEROBIC AND  ANAEROBIC Blood Culture adequate volume   Culture   Final    NO GROWTH 3 DAYS Performed at [facility], [address] [place], [place] [address]    Report Status PENDING  Incomplete  Blood culture (routine x 2)     Status: None (Preliminary result)   Collection Time: 03/18/17  9:50 AM  Result Value Ref Range Status   Specimen Description BLOOD BLOOD RIGHT WRIST  Final   Special Requests   Final    BOTTLES DRAWN AEROBIC AND ANAEROBIC Blood Culture adequate volume   Culture   Final    NO GROWTH 3 DAYS Performed at [facility], [address], [place], [place] [address]    Report Status PENDING  Incomplete  MRSA culture     Status: None   Collection Time: 03/18/17  4:06 PM  Result Value Ref Range Status   Specimen Description   Final    NASAL SWAB Performed at [facility], [address], [place], [place] [address]    Special Requests   Final    NONE Performed at [facility], [address], [place], [place] [address]    Culture   Final    NO MRSA DETECTED Performed at [facility], [address], [place], [place] [address]    Report Status 03/20/2017 FINAL  Final      Management plans discussed with the patient, nursing and social worker and they are in agreement.  CODE STATUS:     Code Status Orders  (From admission, onward)        Start     Ordered   03/20/17 1406  Do not attempt resuscitation (DNR)  Continuous    Question Answer Comment  In the event of cardiac or respiratory ARREST Do not call a "code blue"   In the event of cardiac or respiratory ARREST Do not perform Intubation, CPR, defibrillation or ACLS   In the event of cardiac or respiratory ARREST Use medication by any route, position, wound care, and other measures to relive pain and suffering. May use oxygen, suction and manual treatment of airway obstruction as needed for comfort.      03/20/17 1407    Code Status History    Date Active Date  Inactive Code Status Order ID Comments User Context  03/19/2017 17:10 03/20/2017 14:07 DNR [id number]  Ledell Peoples, RN Inpatient   03/18/2017 15:30 03/19/2017 16:57 Full Code [id number]  Adrian Saran, MD Inpatient   10/27/2016 04:17 11/15/2016 16:15 Full Code [id number]  Ihor Austin, MD Inpatient   10/23/2016 18:42 10/26/2016 21:03 Full Code [id number]  Altamese Dilling, MD Inpatient      TOTAL TIME TAKING CARE OF THIS PATIENT: 32 minutes.    Alford Highland M.D on 03/21/2017 at 8:49 AM  Between 7am to 6pm - Pager - [phone]  After 6pm go to [email or web address] - password [email or web address]  [employer] [facility] Office  [phone]  CC: Primary care physician; Marguarite Arbour, MD

## 2017-03-21 NOTE — Progress Notes (Signed)
Patient discharged to hospice via EMS.

## 2017-03-21 NOTE — Progress Notes (Signed)
Visit made to new hospice home referral. Patient seen lying in bed alert and able to answer questions, denied pain. Audible secretions noted, patient is not eating or drinking. He has required suctioning for increased oral secretions. His PMH includes Alzheimer's dementia, DM II, CHF and arthritis. [facility] for evaluation of elevated temp and decreased oxygen saturations. He was treated with IV antibiotics, but remained with altered mental status and no oral intake. Palliative Medicine was consulted for goals of care. Patient has no family and has a legal Guardian Hilbert OdorVanda Thomas, who spoke with NP Morton Stallrystal Griffin and chose to focus on patient's comfort. IV antibiotics were stopped with plans to transfer to the hospice home. Consents signed via fax with Hospice Home SW and AmmonVanda Thomas. Patient to transport to the hospice home today. Report called to the hospice home, EMS notified for transport, hospital care team updated. Discharge summary faxed to referral.  Dayna BarkerKaren Robertson RN, BSN, [facility], [facility] lia[facility] [phone]

## 2017-03-23 LAB — CULTURE, BLOOD (ROUTINE X 2)
CULTURE: NO GROWTH
Culture: NO GROWTH
SPECIAL REQUESTS: ADEQUATE
Special Requests: ADEQUATE

## 2017-03-27 NOTE — ED Provider Notes (Signed)
-----------------------------------------   8:00 AM on 03/27/2017 -----------------------------------------  Late entry, ecg 03/16/17 sinus tachycardia rate 105, no acute ST elevation or depression, nonspecific ST changes.  LAD noted.   Jeanmarie PlantMcShane, James A, MD 03/27/17 [phone]

## 2019-08-10 IMAGING — CR DG PELVIS 1-2V
1 series · 2 of 2 positions shown · non-contrast
Comparison: November 17, 2016.

CLINICAL DATA: Pain following questionable fall

EXAM:
PELVIS - 1-2 VIEW

[Series 1: dg pelvis 1-2 views · 0.14mm/px · 2 of 2 slices shown]
[im 1/2]
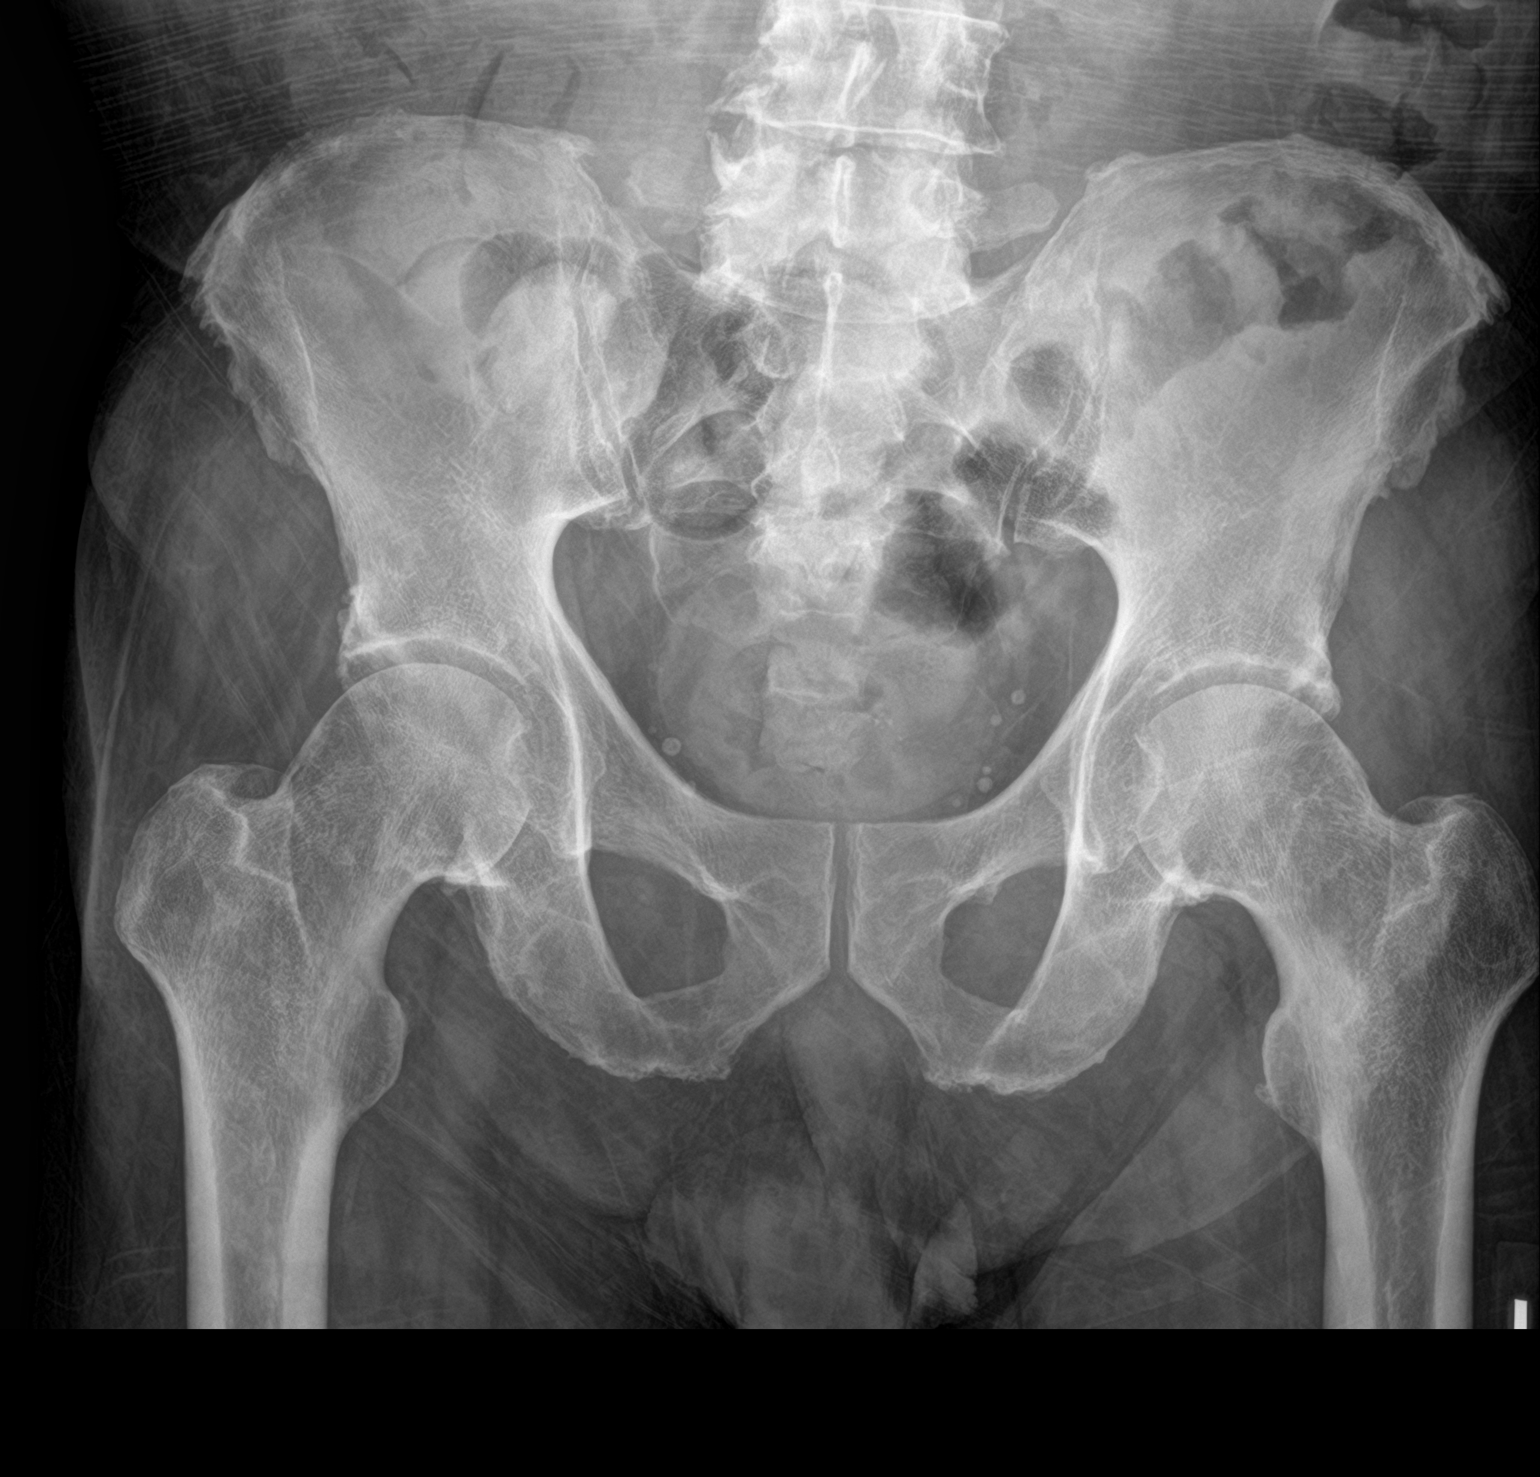
[im 2/2]
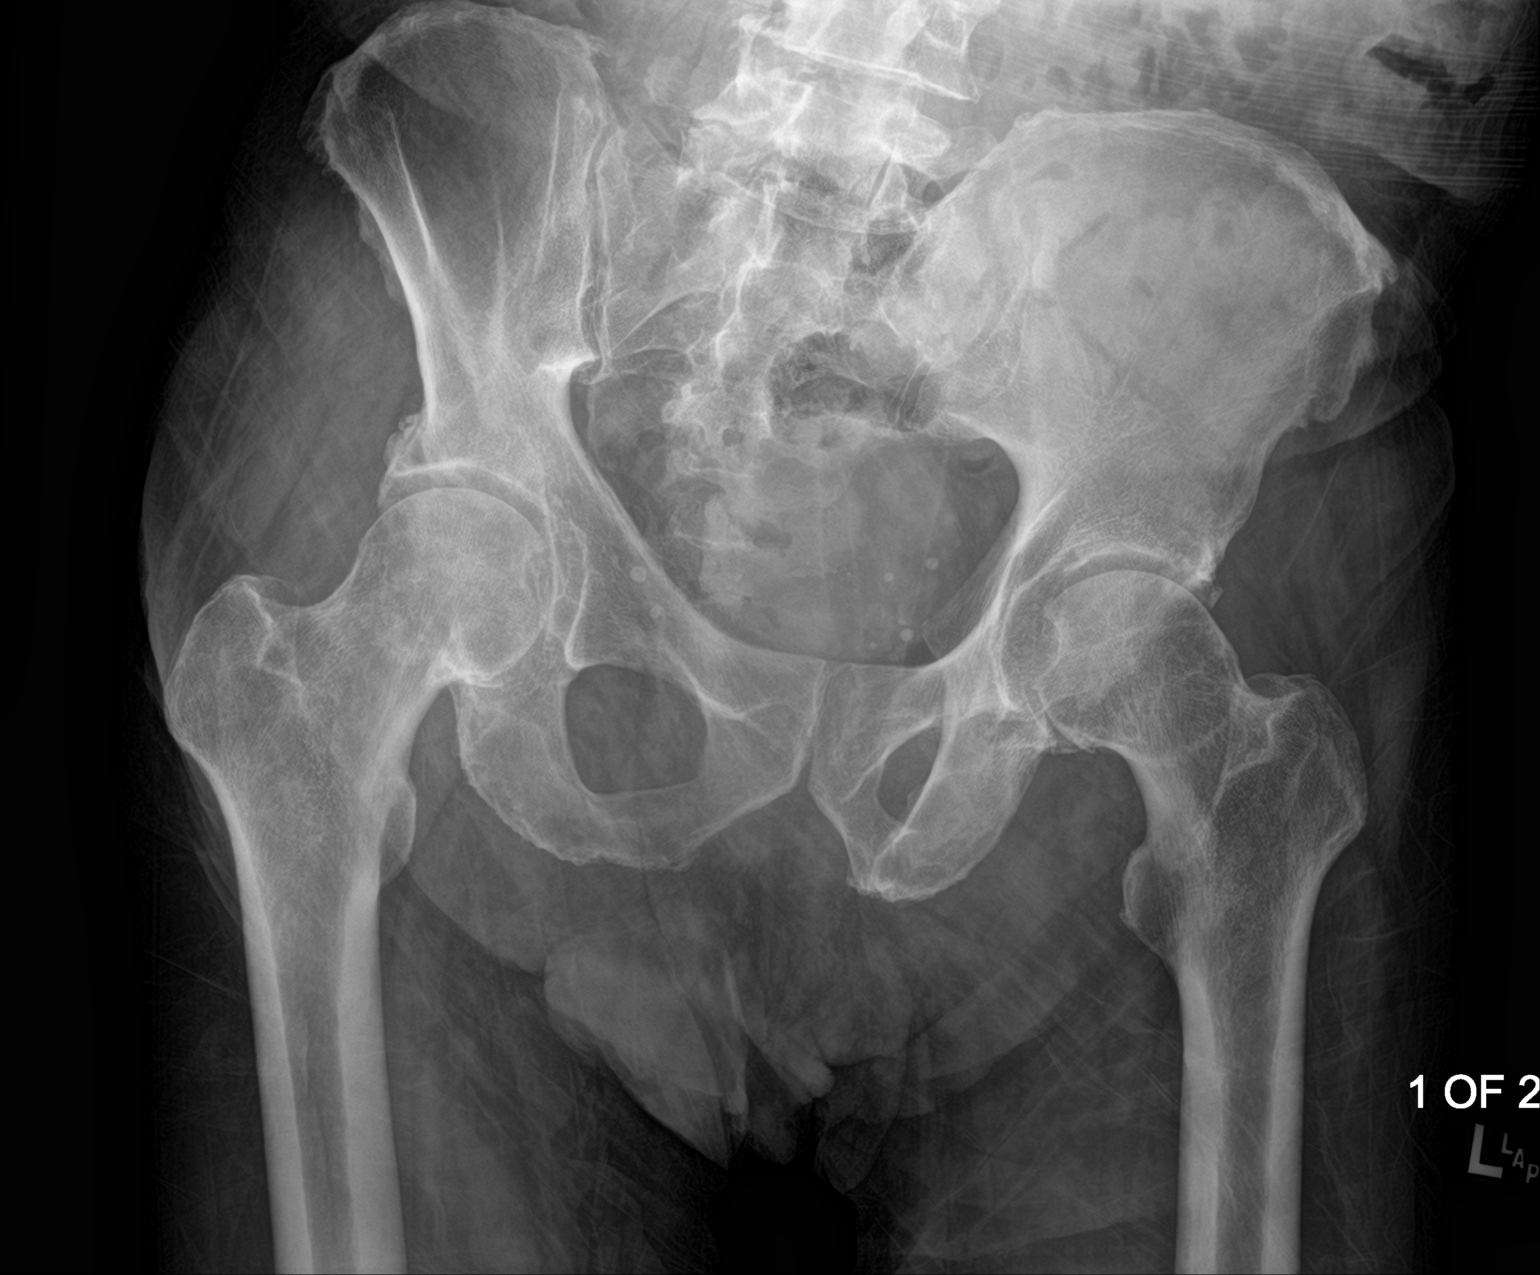

[2 of 2 positions shown; findings below may reference images not displayed]

FINDINGS: There is no demonstrable fracture or dislocation. There is subtle
sclerosis in each femoral neck region which appears stable compared
to prior study and is not felt to be indicative of acute impaction
type injury. There is mild symmetric narrowing of both hip joints.
No erosive change. Sacroiliac joints appear unremarkable.
IMPRESSION: No appreciable change from prior study. No acute fracture or
dislocation month [place]. Symmetric narrowing both hip joints,
fairly mild.

## 2019-08-10 IMAGING — CT CT HEAD W/O CM
3 series · 15 of 47 positions shown, 18 images · non-contrast
Comparison: 12/04/2016.

CLINICAL DATA: Altered level of consciousness, unexplained.

EXAM:
CT HEAD WITHOUT CONTRAST
TECHNIQUE: Contiguous axial images were obtained from the base of the skull
through the vertex without intravenous contrast.

[Series 2: head wo · axial · 0.45mm/px · z∈[-102,+28]mm · 9 of 32 slices shown, 12 images]
[im 3/32  brain]
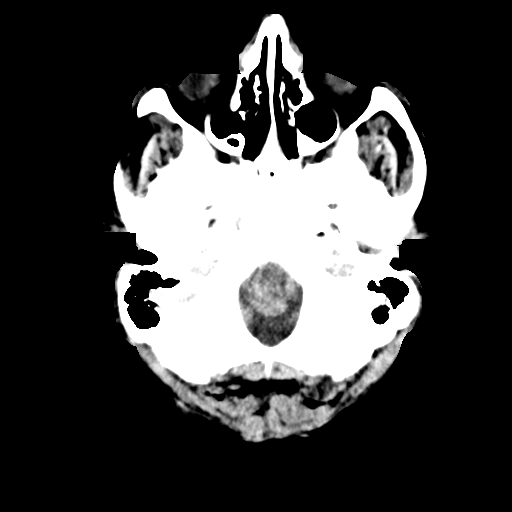
[im 3/32  bone]
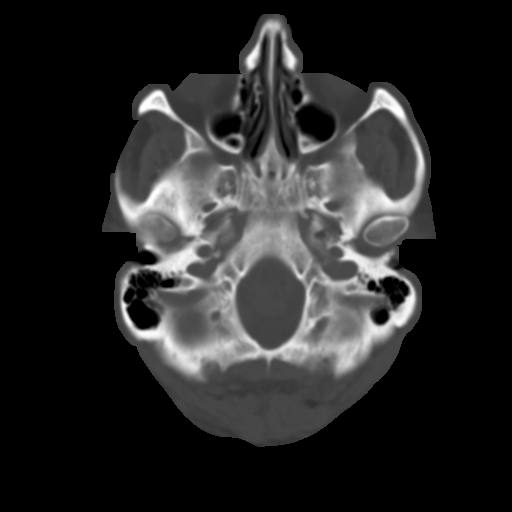
[im 6/32  brain]
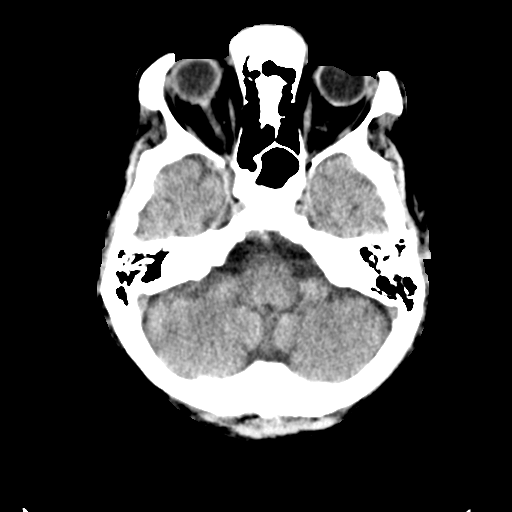
[im 9/32  brain]
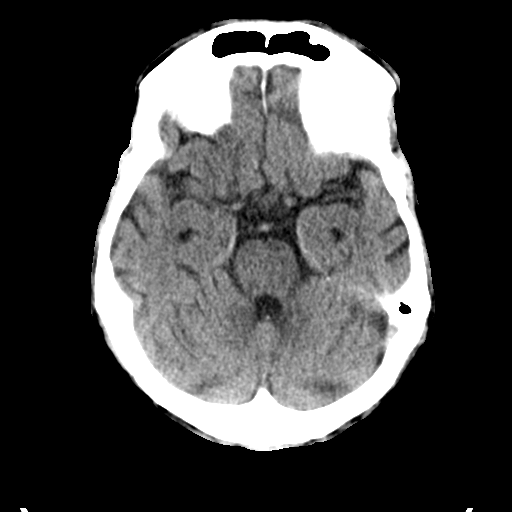
[im 12/32  brain]
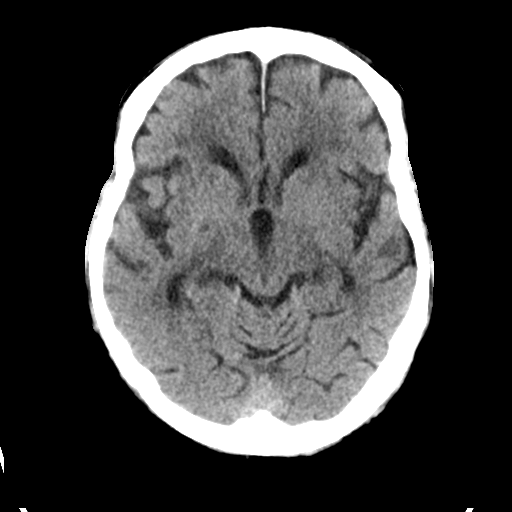
[im 17/32  brain]
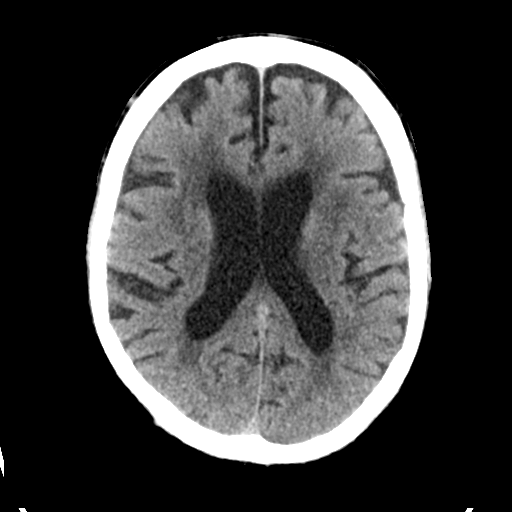
[im 17/32  bone]
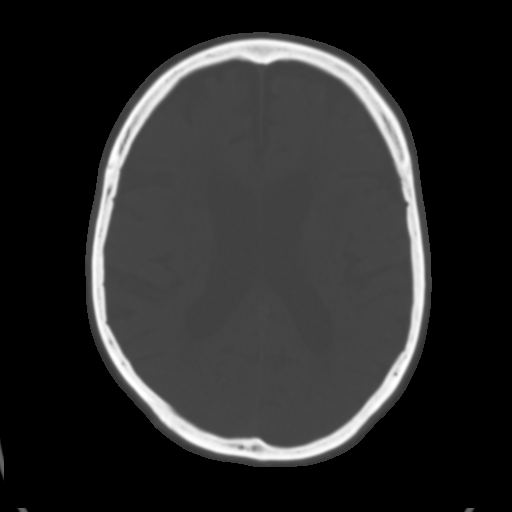
[im 20/32  brain]
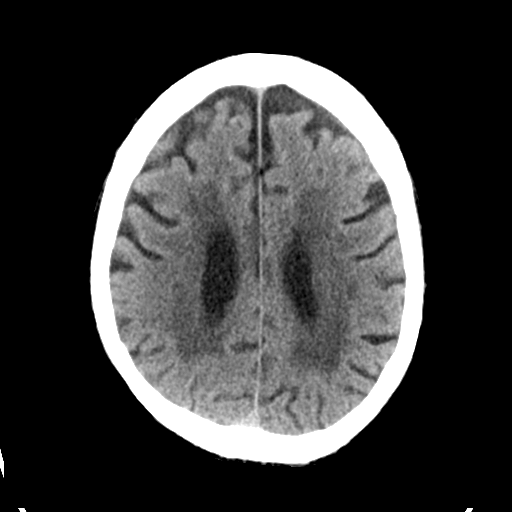
[im 23/32  brain]
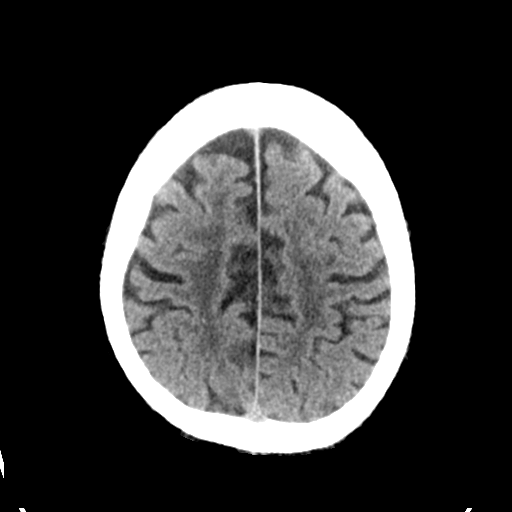
[im 26/32  brain]
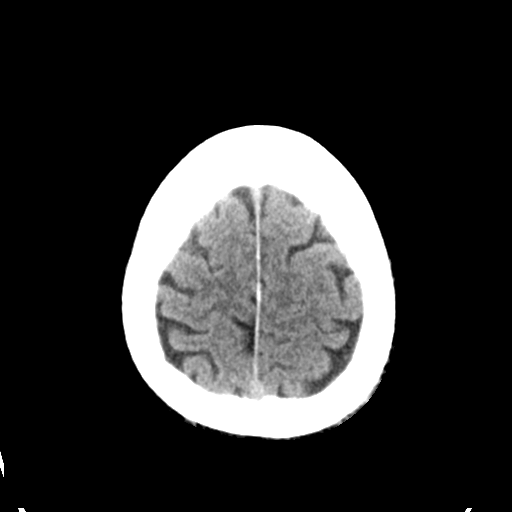
[im 29/32  brain]
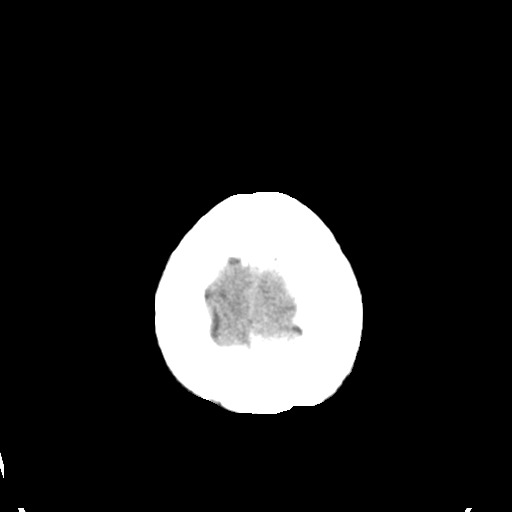
[im 29/32  bone]
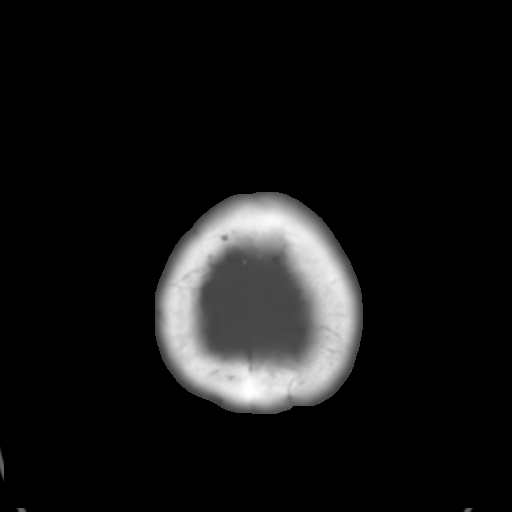

[Series 4: coronal soft tissue · coronal · 0.32mm/px · 3 of 69 slices shown]
[im 23/69  brain]
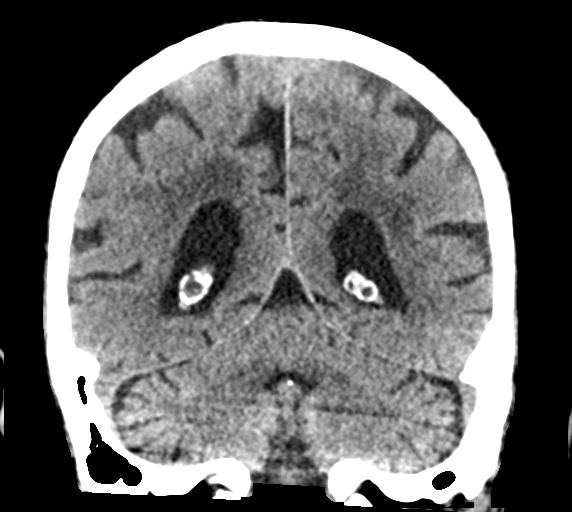
[im 31/69  brain]
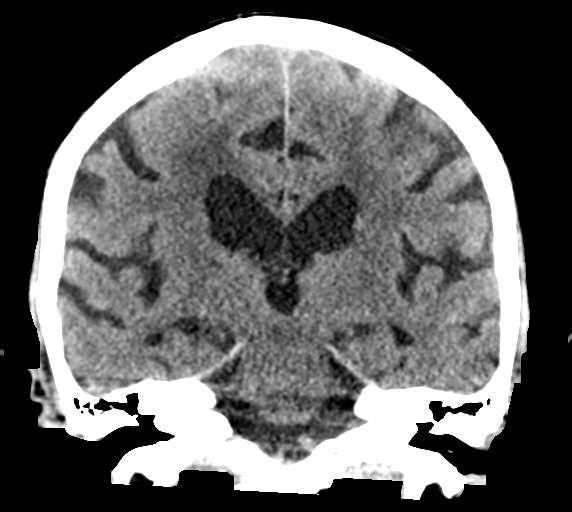
[im 38/69  brain]
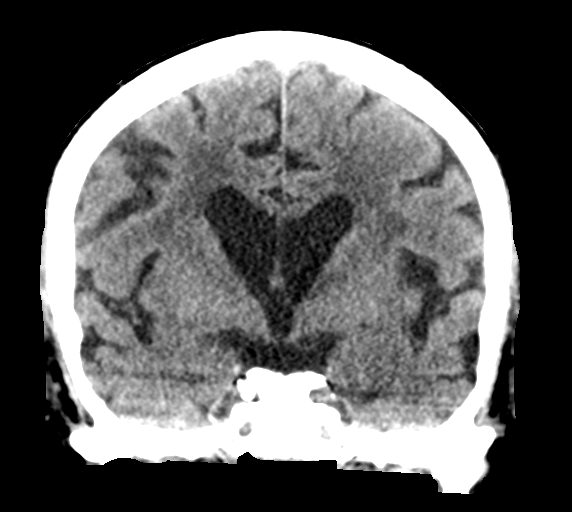

[Series 5: sagittal soft tissue · sagittal · 0.32mm/px · 3 of 61 slices shown]
[im 21/61  brain]
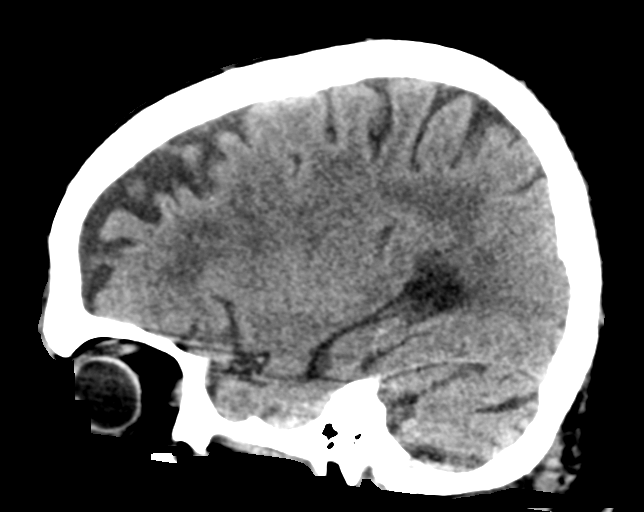
[im 31/61  brain]
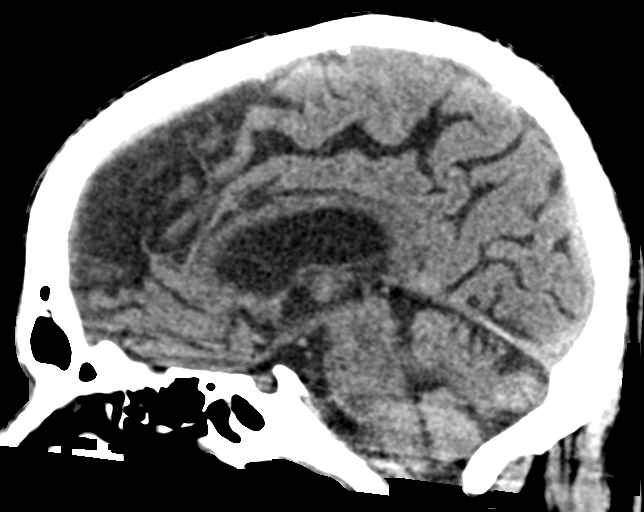
[im 41/61  brain]
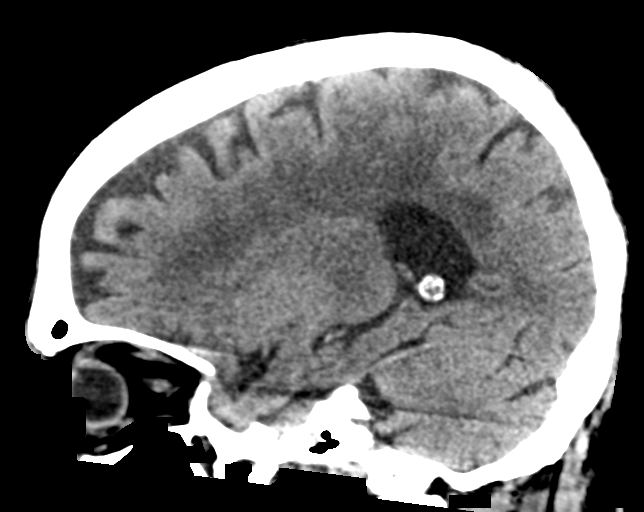

[15 of 47 positions shown; findings below may reference images not displayed]

FINDINGS: Brain: No evidence for acute infarction, hemorrhage, mass lesion,
hydrocephalus, or extra-axial fluid. Generalized atrophy, not
unexpected for age. White matter hypoattenuation, suspected small
vessel disease, without interval change or acute features. Chronic
lacunar infarct, genu internal capsule on the LEFT.

Vascular: Calcification of the cavernous internal carotid arteries
consistent with cerebrovascular atherosclerotic disease. No signs of
intracranial large vessel occlusion.

Skull: Normal. Negative for fracture or focal lesion.

Sinuses/Orbits: No acute finding.

Other: None.
IMPRESSION: Atrophy and small vessel disease. No acute intracranial findings.
Stable appearance from priors.
